# Patient Record
Sex: Male | Born: 1940
Health system: Southern US, Community
[De-identification: ages and names within clinical notes are randomized; demographics above are authoritative.]

## PROBLEM LIST (undated history)

## (undated) DIAGNOSIS — E7800 Pure hypercholesterolemia, unspecified: Secondary | ICD-10-CM

## (undated) DIAGNOSIS — F0390 Unspecified dementia without behavioral disturbance: Secondary | ICD-10-CM

## (undated) DIAGNOSIS — I209 Angina pectoris, unspecified: Secondary | ICD-10-CM

## (undated) DIAGNOSIS — K219 Gastro-esophageal reflux disease without esophagitis: Secondary | ICD-10-CM

## (undated) DIAGNOSIS — I2510 Atherosclerotic heart disease of native coronary artery without angina pectoris: Secondary | ICD-10-CM

## (undated) HISTORY — PX: ABDOMINAL SURGERY: SHX537

## (undated) HISTORY — PX: CORONARY ANGIOPLASTY: SHX604

## (undated) HISTORY — PX: CARDIAC CATHETERIZATION: SHX172

---

## 1998-02-03 ENCOUNTER — Encounter: Admission: RE | Admit: 1998-02-03 | Discharge: 1998-02-03 | Payer: Self-pay | Admitting: *Deleted

## 2001-05-30 ENCOUNTER — Emergency Department (HOSPITAL_COMMUNITY): Admission: EM | Admit: 2001-05-30 | Discharge: 2001-05-30 | Payer: Self-pay | Admitting: Emergency Medicine

## 2001-05-30 ENCOUNTER — Encounter: Payer: Self-pay | Admitting: Emergency Medicine

## 2013-05-12 ENCOUNTER — Encounter (HOSPITAL_COMMUNITY): Payer: Self-pay | Admitting: Emergency Medicine

## 2013-05-12 ENCOUNTER — Inpatient Hospital Stay (HOSPITAL_COMMUNITY): Payer: Medicare Other

## 2013-05-12 ENCOUNTER — Telehealth: Payer: Self-pay | Admitting: Physician Assistant

## 2013-05-12 ENCOUNTER — Encounter (HOSPITAL_COMMUNITY)
Admission: EM | Disposition: A | Discharge status: Home / Self Care | Payer: Medicare Other | Source: Home / Self Care | Attending: Cardiology

## 2013-05-12 ENCOUNTER — Inpatient Hospital Stay (HOSPITAL_COMMUNITY)
Admission: EM | Admit: 2013-05-12 | Discharge: 2013-05-15 | DRG: 247 | Disposition: A | Discharge status: Home / Self Care | Payer: Medicare Other | Attending: Cardiology | Admitting: Cardiology

## 2013-05-12 DIAGNOSIS — E7800 Pure hypercholesterolemia, unspecified: Secondary | ICD-10-CM | POA: Diagnosis present

## 2013-05-12 DIAGNOSIS — Z87891 Personal history of nicotine dependence: Secondary | ICD-10-CM

## 2013-05-12 DIAGNOSIS — K219 Gastro-esophageal reflux disease without esophagitis: Secondary | ICD-10-CM | POA: Diagnosis present

## 2013-05-12 DIAGNOSIS — Z602 Problems related to living alone: Secondary | ICD-10-CM

## 2013-05-12 DIAGNOSIS — E785 Hyperlipidemia, unspecified: Secondary | ICD-10-CM

## 2013-05-12 DIAGNOSIS — I2582 Chronic total occlusion of coronary artery: Secondary | ICD-10-CM | POA: Diagnosis present

## 2013-05-12 DIAGNOSIS — I213 ST elevation (STEMI) myocardial infarction of unspecified site: Secondary | ICD-10-CM

## 2013-05-12 DIAGNOSIS — Z79899 Other long term (current) drug therapy: Secondary | ICD-10-CM

## 2013-05-12 DIAGNOSIS — I498 Other specified cardiac arrhythmias: Secondary | ICD-10-CM | POA: Diagnosis not present

## 2013-05-12 DIAGNOSIS — I2510 Atherosclerotic heart disease of native coronary artery without angina pectoris: Secondary | ICD-10-CM

## 2013-05-12 DIAGNOSIS — I2129 ST elevation (STEMI) myocardial infarction involving other sites: Principal | ICD-10-CM | POA: Diagnosis present

## 2013-05-12 DIAGNOSIS — Z7902 Long term (current) use of antithrombotics/antiplatelets: Secondary | ICD-10-CM

## 2013-05-12 DIAGNOSIS — Z7982 Long term (current) use of aspirin: Secondary | ICD-10-CM

## 2013-05-12 HISTORY — DX: Atherosclerotic heart disease of native coronary artery without angina pectoris: I25.10

## 2013-05-12 HISTORY — DX: Pure hypercholesterolemia, unspecified: E78.00

## 2013-05-12 HISTORY — DX: Angina pectoris, unspecified: I20.9

## 2013-05-12 HISTORY — PX: LEFT HEART CATHETERIZATION WITH CORONARY ANGIOGRAM: SHX5451

## 2013-05-12 HISTORY — DX: Gastro-esophageal reflux disease without esophagitis: K21.9

## 2013-05-12 HISTORY — DX: ST elevation (STEMI) myocardial infarction involving other sites: I21.29

## 2013-05-12 HISTORY — PX: PERCUTANEOUS STENT INTERVENTION: SHX5500

## 2013-05-12 LAB — COMPREHENSIVE METABOLIC PANEL
ALBUMIN: 3.6 g/dL (ref 3.5–5.2)
ALK PHOS: 72 U/L (ref 39–117)
ALT: 17 U/L (ref 0–53)
AST: 36 U/L (ref 0–37)
BILIRUBIN TOTAL: 0.4 mg/dL (ref 0.3–1.2)
BUN: 14 mg/dL (ref 6–23)
CHLORIDE: 105 meq/L (ref 96–112)
CO2: 26 mEq/L (ref 19–32)
Calcium: 9.4 mg/dL (ref 8.4–10.5)
Creatinine, Ser: 0.81 mg/dL (ref 0.50–1.35)
GFR calc Af Amer: >90 mL/min (ref >90)
GFR calc non Af Amer: 87 mL/min — ABNORMAL LOW (ref >90)
Glucose, Bld: 111 mg/dL — ABNORMAL HIGH (ref 70–99)
POTASSIUM: 4.3 meq/L (ref 3.7–5.3)
SODIUM: 142 meq/L (ref 137–147)
Total Protein: 6.6 g/dL (ref 6.0–8.3)

## 2013-05-12 LAB — CBC WITH DIFFERENTIAL/PLATELET
BASOS ABS: 0 10*3/uL (ref 0.0–0.1)
Basophils Relative: 0 % (ref 0–1)
Eosinophils Absolute: 0 10*3/uL (ref 0.0–0.7)
Eosinophils Relative: 0 % (ref 0–5)
HEMATOCRIT: 43 % (ref 39.0–52.0)
Hemoglobin: 14.5 g/dL (ref 13.0–17.0)
LYMPHS PCT: 12 % (ref 12–46)
Lymphs Abs: 1 10*3/uL (ref 0.7–4.0)
MCH: 30.4 pg (ref 26.0–34.0)
MCHC: 33.7 g/dL (ref 30.0–36.0)
MCV: 90.1 fL (ref 78.0–100.0)
MONO ABS: 0.7 10*3/uL (ref 0.1–1.0)
Monocytes Relative: 9 % (ref 3–12)
NEUTROS ABS: 6.6 10*3/uL (ref 1.7–7.7)
NEUTROS PCT: 79 % — AB (ref 43–77)
Platelets: 219 10*3/uL (ref 150–400)
RBC: 4.77 MIL/uL (ref 4.22–5.81)
RDW: 13.7 % (ref 11.5–15.5)
WBC: 8.4 10*3/uL (ref 4.0–10.5)

## 2013-05-12 LAB — PRO B NATRIURETIC PEPTIDE: Pro B Natriuretic peptide (BNP): 699.9 pg/mL — ABNORMAL HIGH (ref 0–125)

## 2013-05-12 LAB — MRSA PCR SCREENING: MRSA by PCR: NEGATIVE

## 2013-05-12 LAB — MAGNESIUM: Magnesium: 2.2 mg/dL (ref 1.5–2.5)

## 2013-05-12 LAB — TROPONIN I: Troponin I: 5.95 ng/mL (ref <0.30)

## 2013-05-12 SURGERY — LEFT HEART CATHETERIZATION WITH CORONARY ANGIOGRAM
Anesthesia: LOCAL

## 2013-05-12 MED ORDER — HEPARIN SODIUM (PORCINE) 5000 UNIT/ML IJ SOLN: 60.0000 [IU]/kg | INTRAMUSCULAR | Status: DC

## 2013-05-12 MED ORDER — ASPIRIN EC 81 MG PO TBEC
81.0000 mg | DELAYED_RELEASE_TABLET | Freq: Every day | ORAL | Status: DC
  Administered 2013-05-13 – 2013-05-15 (×3): 81 mg via ORAL
  Filled 2013-05-12 (×3): qty 1

## 2013-05-12 MED ORDER — HEPARIN SODIUM (PORCINE) 1000 UNIT/ML IJ SOLN
INTRAMUSCULAR | Status: AC
  Filled 2013-05-12: qty 1

## 2013-05-12 MED ORDER — MIDAZOLAM HCL 2 MG/2ML IJ SOLN
INTRAMUSCULAR | Status: AC
  Filled 2013-05-12: qty 2

## 2013-05-12 MED ORDER — OXYCODONE-ACETAMINOPHEN 5-325 MG PO TABS: 1.0000 | ORAL_TABLET | ORAL | Status: DC | PRN

## 2013-05-12 MED ORDER — SODIUM CHLORIDE 0.9 % IV SOLN
INTRAVENOUS | Status: DC
Start: 2013-05-12 — End: 2013-05-13
  Administered 2013-05-12 – 2013-05-13 (×2): via INTRAVENOUS

## 2013-05-12 MED ORDER — TICAGRELOR 90 MG PO TABS
ORAL_TABLET | ORAL | Status: AC
  Filled 2013-05-12: qty 2

## 2013-05-12 MED ORDER — BIVALIRUDIN 250 MG IV SOLR
INTRAVENOUS | Status: AC
  Filled 2013-05-12: qty 250

## 2013-05-12 MED ORDER — HEPARIN (PORCINE) IN NACL 2-0.9 UNIT/ML-% IJ SOLN
INTRAMUSCULAR | Status: AC
  Filled 2013-05-12: qty 1000

## 2013-05-12 MED ORDER — HEPARIN BOLUS VIA INFUSION
4000.0000 [IU] | Freq: Once | INTRAVENOUS | Status: DC
  Filled 2013-05-12: qty 4000

## 2013-05-12 MED ORDER — ATORVASTATIN CALCIUM 80 MG PO TABS
80.0000 mg | ORAL_TABLET | Freq: Every day | ORAL | Status: DC
  Administered 2013-05-12 – 2013-05-14 (×3): 80 mg via ORAL
  Filled 2013-05-12 (×4): qty 1

## 2013-05-12 MED ORDER — FENTANYL CITRATE 0.05 MG/ML IJ SOLN
INTRAMUSCULAR | Status: AC
  Filled 2013-05-12: qty 2

## 2013-05-12 MED ORDER — SODIUM CHLORIDE 0.9 % IV SOLN: INTRAVENOUS | Status: DC

## 2013-05-12 MED ORDER — FAMOTIDINE 10 MG PO TABS
10.0000 mg | ORAL_TABLET | Freq: Every day | ORAL | Status: DC
  Administered 2013-05-12 – 2013-05-15 (×4): 10 mg via ORAL
  Filled 2013-05-12 (×5): qty 1

## 2013-05-12 MED ORDER — NITROGLYCERIN 0.4 MG SL SUBL: 0.4000 mg | SUBLINGUAL_TABLET | SUBLINGUAL | Status: DC | PRN

## 2013-05-12 MED ORDER — TICAGRELOR 90 MG PO TABS
90.0000 mg | ORAL_TABLET | Freq: Two times a day (BID) | ORAL | Status: DC
  Administered 2013-05-12 – 2013-05-15 (×6): 90 mg via ORAL
  Filled 2013-05-12 (×7): qty 1

## 2013-05-12 MED ORDER — LIDOCAINE HCL (PF) 1 % IJ SOLN
INTRAMUSCULAR | Status: AC
  Filled 2013-05-12: qty 30

## 2013-05-12 MED ORDER — MORPHINE SULFATE 2 MG/ML IJ SOLN: 2.0000 mg | INTRAMUSCULAR | Status: DC | PRN

## 2013-05-12 MED ORDER — HEPARIN SODIUM (PORCINE) 5000 UNIT/ML IJ SOLN
5000.0000 [IU] | Freq: Three times a day (TID) | INTRAMUSCULAR | Status: DC
  Administered 2013-05-13 – 2013-05-15 (×7): 5000 [IU] via SUBCUTANEOUS
  Filled 2013-05-12 (×10): qty 1

## 2013-05-12 MED ORDER — VERAPAMIL HCL 2.5 MG/ML IV SOLN
INTRAVENOUS | Status: AC
  Filled 2013-05-12: qty 2

## 2013-05-12 MED ORDER — TIROFIBAN HCL IV 5 MG/100ML
INTRAVENOUS | Status: AC
  Filled 2013-05-12: qty 100

## 2013-05-12 NOTE — H&P (Signed)
History and Physical  Patient ID: Carlos [name] MRN: [id number], SOB: 03/21/1940 73 y.o. Date of Encounter: 05/12/2013, 11:19 AM  Primary Physician: Dr Tenny Crawoss Primary Cardiologist: none (scheduled to see Dr Donnie Ahoilley)  Chief Complaint: Chest Pain  HPI: 73 y.o. male with no past cardiac history who presented to [facility] on 05/12/2013 with complaints of chest pain. He has had stuttering chest pain for the past week, but developed more persistent and severe pain this morning. He has seen his PCP and was scheduled for an outpatient cardiology evaluation this week with Dr Donnie Ahoilley. EMS was called this morning and a Code STEMI was called from the field.  The patient reports 2/10 ongoing chest pain at present. He denies dyspnea, edema, lightheadedness, or syncope. He states 'I don't feel well right now.'  He is a widower x 10 years and lives alone. His neighbor is here with him today. The patient is a former smoker but quit several years ago. He does not drink alcohol.  Past Medical History  Diagnosis Date  . GERD (gastroesophageal reflux disease)   . Angina pectoris   . Hypercholesteremia      Surgical History:  Past Surgical History  Procedure Laterality Date  . Abdominal surgery       Home Meds: Prior to Admission medications   Medication Sig Start Date End Date Taking? Authorizing Provider  aspirin 325 MG EC tablet Take 325 mg by mouth daily.   Yes Historical Provider, MD  calcium carbonate (OS-CAL) 600 MG TABS tablet Take 800 mg by mouth daily with breakfast.   Yes Historical Provider, MD  famotidine (PEPCID) 10 MG tablet Take 10 mg by mouth daily.   Yes Historical Provider, MD  glucosamine-chondroitin 500-400 MG tablet Take 1 tablet by mouth daily.   Yes Historical Provider, MD  Multiple Vitamins-Minerals (MULTIVITAMIN WITH MINERALS) tablet Take 1 tablet by mouth daily.   Yes Historical Provider, MD  nabumetone (RELAFEN) 750 MG tablet Take 750 mg by mouth daily.   Yes  Historical Provider, MD  simvastatin (ZOCOR) 40 MG tablet Take 40 mg by mouth daily.   Yes Historical Provider, MD  vitamin A 7500 UNIT capsule Take 7,500 Units by mouth every other day.   Yes Historical Provider, MD    Allergies: No Known Allergies  History   Social History  . Marital Status: Single    Spouse Name: N/A    Number of Children: N/A  . Years of Education: N/A   Occupational History  . Not on file.   Social History Main Topics  . Smoking status: Former Games developermoker  . Smokeless tobacco: Never Used  . Alcohol Use: No  . Drug Use: No  . Sexual Activity: Not on file   Other Topics Concern  . Not on file   Social History Narrative  . No narrative on file    Family Hx: Brother had CABG in his 4560's  Review of Systems: General: negative for chills, fever, night sweats or weight changes.  ENT: negative for rhinorrhea or epistaxis Cardiovascular: see HPI Dermatological: negative for rash Respiratory: negative for cough or wheezing GI: negative for nausea, vomiting, diarrhea, bright red blood per rectum, melena, or hematemesis GU: no hematuria, urgency, or frequency Neurologic: negative for visual changes, syncope, headache, or dizziness Heme: no easy bruising or bleeding Endo: negative for excessive thirst, thyroid disorder, or flushing Musculoskeletal: negative for joint pain or swelling, negative for myalgias All other systems reviewed and are otherwise negative except  as noted above.  Physical Exam: Blood pressure 125/74, pulse 73, temperature 97.8 F (36.6 C), temperature source Oral, resp. rate 21, SpO2 99.00%. General: Well developed, well nourished, alert and oriented, in no acute distress. HEENT: Normocephalic, atraumatic, sclera non-icteric, no xanthomas, nares are without discharge.  Neck: Supple. Carotids 2+ without bruits. JVP normal Lungs: Clear bilaterally to auscultation without wheezes, rales, or rhonchi. Breathing is unlabored. Heart: RRR with  normal S1 and S2. No murmurs, rubs, or gallops appreciated. Abdomen: Soft, non-tender, non-distended with normoactive bowel sounds. No hepatomegaly. No rebound/guarding. No obvious abdominal masses. Back: No CVA tenderness Msk:  Strength and tone appear normal for age. Extremities: No clubbing, cyanosis, or edema.  Distal pedal pulses are 2+ and equal bilaterally. Neuro: CNII-XII intact, moves all extremities spontaneously. Psych:  Responds to questions appropriately with a normal affect.   Labs:   No results found for this basename: WBC, HGB, HCT, MCV, PLT   No results found for this basename: NA, K, CL, CO2, BUN, CREATININE, CALCIUM, LABALBU, PROT, BILITOT, ALKPHOS, ALT, AST, GLUCOSE,  in the last 168 hours No results found for this basename: CKTOTAL, CKMB, TROPONINI,  in the last 72 hours No results found for this basename: CHOL, HDL, LDLCALC, TRIG   No results found for this basename: DDIMER    Radiology/Studies:  No results found.   EKG: Sinus rhythm with subtle inferolateral ST elevation, but suggestive of STEMI  ASSESSMENT AND PLAN:  73 year-old male with stuttering angina x one week, now with ongoing rest pain and EKG suggestive of lateral STEMI.   Heparin 4000 units and Brilinta 180 mg   Emergency cath and primary PCI (emergency informed consent obtained)  Check lipids, baseline labs, CXR  Further plans and disposition pending cath results  Signed, [name]  05/12/2013, 11:19 AM

## 2013-05-12 NOTE — ED Notes (Signed)
Pt placed on zoll and accompanied to cath lab by rn and dr cooper. Pt cp 2/10. Dr Excell Seltzer has advised to hold pt heparin and move to cath lab they are ready for pt. Pt belongings taken to cath lab with pt. He has no family here with him. Sinus rhythm on monitor. Pt alert and oriented

## 2013-05-12 NOTE — ED Notes (Signed)
ecg completed and shown to dr wofford. Pt rates pain 2/10

## 2013-05-12 NOTE — ED Provider Notes (Signed)
CSN: [id number]     Arrival date & time 05/12/13  1040 History   First MD Initiated Contact with Patient 05/12/13 1102     Chief Complaint  Patient presents with  . Chest Pain     (Consider location/radiation/quality/duration/timing/severity/associated sxs/prior Treatment) Patient is a 73 y.o. male presenting with chest pain.  Chest Pain Pain location:  Substernal area Pain quality: sharp   Pain severity:  Moderate Onset quality:  Sudden Duration: 1 week, intermittent. Timing:  Intermittent Progression:  Worsening Chronicity:  New Context comment:  Took aspirin 325 at about 8 am.   Relieved by:  Leaning forward (nitro) Associated symptoms: shortness of breath   Associated symptoms: no abdominal pain, no cough, no diaphoresis, no fever, no nausea and not vomiting     Past Medical History  Diagnosis Date  . GERD (gastroesophageal reflux disease)   . Angina pectoris   . Hypercholesteremia    Past Surgical History  Procedure Laterality Date  . Abdominal surgery     No family history on file. History  Substance Use Topics  . Smoking status: Former Games developermoker  . Smokeless tobacco: Never Used  . Alcohol Use: No    Review of Systems  Constitutional: Negative for fever and diaphoresis.  Respiratory: Positive for shortness of breath. Negative for cough.   Cardiovascular: Positive for chest pain.  Gastrointestinal: Negative for nausea, vomiting, abdominal pain and diarrhea.  All other systems reviewed and are negative.      Allergies  Review of patient's allergies indicates no known allergies.  Home Medications   Current Outpatient Rx  Name  Route  Sig  Dispense  Refill  . aspirin 325 MG EC tablet   Oral   Take 325 mg by mouth daily.         . calcium carbonate (OS-CAL) 600 MG TABS tablet   Oral   Take 800 mg by mouth daily with breakfast.         . famotidine (PEPCID) 10 MG tablet   Oral   Take 10 mg by mouth daily.         Marland Kitchen. glucosamine-chondroitin  500-400 MG tablet   Oral   Take 1 tablet by mouth daily.         . Multiple Vitamins-Minerals (MULTIVITAMIN WITH MINERALS) tablet   Oral   Take 1 tablet by mouth daily.         . nabumetone (RELAFEN) 750 MG tablet   Oral   Take 750 mg by mouth daily.         . simvastatin (ZOCOR) 40 MG tablet   Oral   Take 40 mg by mouth daily.         . vitamin A 7500 UNIT capsule   Oral   Take 7,500 Units by mouth every other day.          BP 125/74  Pulse 71  Temp(Src) 97.8 F (36.6 C) (Oral)  Resp 18  SpO2 99% Physical Exam  Nursing note and vitals reviewed. Constitutional: He is oriented to person, place, and time. He appears well-developed and well-nourished. No distress.  HENT:  Head: Normocephalic and atraumatic.  Mouth/Throat: Oropharynx is clear and moist.  Eyes: Conjunctivae are normal. Pupils are equal, round, and reactive to light. No scleral icterus.  Neck: Neck supple.  Cardiovascular: Normal rate, regular rhythm, normal heart sounds and intact distal pulses.   No murmur heard. Pulmonary/Chest: Effort normal and breath sounds normal. No stridor. No respiratory distress. He has no  wheezes. He has no rales.  Abdominal: Soft. He exhibits no distension. There is no tenderness.  Musculoskeletal: Normal range of motion. He exhibits no edema.  Neurological: He is alert and oriented to person, place, and time.  Skin: Skin is warm and dry. No rash noted.  Psychiatric: He has a normal mood and affect. His behavior is normal.    ED Course  CRITICAL CARE Performed by: Blake Divine DAVID III Authorized by: Blake Divine DAVID III Total critical care time: 30 minutes Critical care time was exclusive of separately billable procedures and treating other patients. Critical care was necessary to treat or prevent imminent or life-threatening deterioration of the following conditions: cardiac failure. Critical care was time spent personally by me on the following activities:  development of treatment plan with patient or surrogate, discussions with consultants, evaluation of patient's response to treatment, examination of patient, obtaining history from patient or surrogate, ordering and performing treatments and interventions, ordering and review of laboratory studies, ordering and review of radiographic studies, pulse oximetry, re-evaluation of patient's condition and review of old charts.   (including critical care time) Labs Review Labs Reviewed - No data to display Imaging Review No results found.   EKG Interpretation   Date/Time:  Saturday May 12 2013 10:48:13 EDT Ventricular Rate:  67 PR Interval:  161 QRS Duration: 107 QT Interval:  374 QTC Calculation: 395 R Axis:   -42 Text Interpretation:  sinus rhythm Inferior infarct, acute (RCA) Probable  RV involvement, suggest recording right precordial leads Baseline wander  in lead(s) I III aVR aVL V2 V4 V5 V6 Reconfirmed by [facility]  MD, TREY  ([id number]) on 05/12/2013 12:28:02 PM      MDM   Final diagnoses:  STEMI (ST elevation myocardial infarction)    Well appearing 73 yo male with chest pain.  Took full aspirin prior to EMS arrival.  EMS 12 lead concerning for STEMI.  Code STEMI called prior to arrival.  Pain near resolved with nitro and EKG changes not as pronounced on our EKGs.  Discussed with Dr. Excell Seltzer who has taken pt to cath lab.      Candyce Churn III, MD 05/12/13 1230

## 2013-05-12 NOTE — Interval H&P Note (Signed)
History and Physical Interval Note:  05/12/2013 12:55 PM  Carlos [name]  has presented today for surgery, with the diagnosis of STEMI  The various methods of treatment have been discussed with the patient and family. After consideration of risks, benefits and other options for treatment, the patient has consented to  Procedure(s): LEFT HEART CATHETERIZATION WITH CORONARY ANGIOGRAM (N/A) PERCUTANEOUS STENT INTERVENTION (N/A) as a surgical intervention .  The patient's history has been reviewed, patient examined, no change in status, stable for surgery.  I have reviewed the patient's chart and labs.  Questions were answered to the patient's satisfaction.    Cath Lab Visit (complete for each Cath Lab visit)  Clinical Evaluation Leading to the Procedure:   ACS: yes  Non-ACS:    Anginal Classification: CCS IV  Anti-ischemic medical therapy: No Therapy  Non-Invasive Test Results: No non-invasive testing performed  Prior CABG: No previous CABG        [name]

## 2013-05-12 NOTE — ED Notes (Signed)
Two ekg's were shot.

## 2013-05-12 NOTE — ED Notes (Addendum)
Reports intermittent chest pain over the past week. Onset symptoms last Saturday. States has been having 3-4 episodes of cp daily. This morning pain worse and radiated to the bilat neck.pt did take 325 of asa this morning around 8 am at home after breakfast. He also took 1 sl ntg. ems gave 2 ntg enroute

## 2013-05-12 NOTE — Progress Notes (Signed)
Chaplain paged to code stemi. Patient being worked on IT consultant. No family present. Chaplain to be contacted when family arrives.   05/12/13 1035  Clinical Encounter Type  Visited With Patient not available;Health care provider  Visit Type Initial;Code;ED

## 2013-05-12 NOTE — CV Procedure (Signed)
    Cardiac Catheterization Procedure Note  Name: Carlos [name] MRN: [id number] DOB: 1940-08-15  Procedure: Left Heart Cath, Selective Coronary Angiography, LV angiography, PTCA and stenting of the left circumflex (primary PCI)  Indication: Acute lateral STEMI  Procedural Details:  The right wrist was prepped, draped, and anesthetized with 1% lidocaine. Using the modified Seldinger technique, a 5/6 French sheath was introduced into the right radial artery. 3 mg of verapamil was administered through the sheath, weight-based unfractionated heparin was administered intravenously. Standard Judkins catheters were used for selective coronary angiography and left ventriculography. Ventriculography was performed after PCI.  Catheter exchanges were performed over an exchange length guidewire.  PROCEDURAL FINDINGS Hemodynamics: AO 90/53 LV 88/18   Coronary angiography: Coronary dominance: right  Left mainstem: Widely patent without obstructive disease  Left anterior descending (LAD): There is heavy calcification throughout the proximal and mid-LAD. The first diagonal is medium in caliber and has 70% ostial stenosis. The mid-LAD has diffuse 75-80% stenosis at it's worst. The distal LAD is widely patent.   Left circumflex (LCx): Acute total occlusion in the mid-vessel. After reperfusion, the LCx is a large distribution vessel supply a large second OM and multiple PLA branches.   Right coronary artery (RCA): Total proximal occlusion. The PDA fills from left-to-right collaterals.   Left ventriculography: Left ventricular systolic function is normal, LVEF is estimated at 55%, there is no significant mitral regurgitation   PCI Note:  Following the diagnostic procedure, the decision was made to proceed with PCI.  Brilinta 180 mg was given.  Weight-based bivalirudin was given for anticoagulation. Once a therapeutic ACT was achieved, a 6 [place] XB-LAD 3.5 cm guide catheter was inserted.  A cougar  coronary guidewire was used to cross the lesion.  The lesion was predilated with a 2.5x12 mm balloon.  The lesion was then stented with a 3.25x32 mm Xience drug-eluting stent.  There was slow flow after stenting. IC verapamil was administered. The stent appeared well-sized to the vessel and I elected not to post-dilate because of concerns about worsening distal embolization and no-reflow. After IC verapamil, flow improved to TIMI-3.   Following PCI, there was 0% residual stenosis and TIMI-3 flow. Final angiography confirmed an excellent result. The patient tolerated the procedure well. There were no immediate procedural complications. A TR band was used for radial hemostasis. The patient was transferred to the post catheterization recovery area for further monitoring.  PCI Data: Vessel - LCx/Segment - Mid Percent Stenosis (pre)  100 TIMI-flow 0 Stent 3.25x32 mm Xience DES Percent Stenosis (post) 0 TIMI-flow (post) 3  Final Conclusions:   1. Acute lateral STEMI secondary to occlusion of a large-distribution left circumflex, treated successfully with Primary PCI using a drug-eluting stent 2. Multivessel CAD with moderately severe diffuse mid-LAD stenosis and chronic total occlusion of the RCA 3. Preserved LV systolic function   Recommendations:  Tx to the CCU for post-MI care. ASA/brilinta x 12 months. Will review films, but favor early medical therapy for residual CAD. No beta-blocker initially as pt is bradycardiac.  [name] 05/12/2013, 12:57 PM

## 2013-05-12 NOTE — Telephone Encounter (Signed)
Pt is not a patient of ours yet but called answering service saying he recently was referred to our office for an appt next Tuesday for intermittent CP. He is c/o significant chest pain today and wants to know if he should take any additional medicine. I advised he call 911 immediately for further evaluation. He verbalized understanding and gratitude and plans to do so. Dayna Dunn PA-C

## 2013-05-13 DIAGNOSIS — I219 Acute myocardial infarction, unspecified: Secondary | ICD-10-CM

## 2013-05-13 LAB — LIPID PANEL
CHOL/HDL RATIO: 3.7 ratio
Cholesterol: 160 mg/dL (ref 0–200)
HDL: 43 mg/dL (ref >39)
LDL Cholesterol: 93 mg/dL (ref 0–99)
Triglycerides: 120 mg/dL (ref <150)
VLDL: 24 mg/dL (ref 0–40)

## 2013-05-13 LAB — TROPONIN I

## 2013-05-13 LAB — CBC
HEMATOCRIT: 41.9 % (ref 39.0–52.0)
Hemoglobin: 14.1 g/dL (ref 13.0–17.0)
MCH: 30.7 pg (ref 26.0–34.0)
MCHC: 33.7 g/dL (ref 30.0–36.0)
MCV: 91.3 fL (ref 78.0–100.0)
Platelets: 189 10*3/uL (ref 150–400)
RBC: 4.59 MIL/uL (ref 4.22–5.81)
RDW: 14 % (ref 11.5–15.5)
WBC: 8.5 10*3/uL (ref 4.0–10.5)

## 2013-05-13 LAB — BASIC METABOLIC PANEL
BUN: 14 mg/dL (ref 6–23)
CHLORIDE: 108 meq/L (ref 96–112)
CO2: 25 mEq/L (ref 19–32)
Calcium: 9.4 mg/dL (ref 8.4–10.5)
Creatinine, Ser: 0.89 mg/dL (ref 0.50–1.35)
GFR calc non Af Amer: 83 mL/min — ABNORMAL LOW (ref >90)
Glucose, Bld: 117 mg/dL — ABNORMAL HIGH (ref 70–99)
POTASSIUM: 4.1 meq/L (ref 3.7–5.3)
Sodium: 145 mEq/L (ref 137–147)

## 2013-05-13 LAB — TSH: TSH: 2.284 u[IU]/mL (ref 0.350–4.500)

## 2013-05-13 LAB — HEMOGLOBIN A1C
Hgb A1c MFr Bld: 5.7 % — ABNORMAL HIGH (ref <5.7)
MEAN PLASMA GLUCOSE: 117 mg/dL — AB (ref <117)

## 2013-05-13 MED ORDER — METOPROLOL TARTRATE 12.5 MG HALF TABLET
12.5000 mg | ORAL_TABLET | Freq: Two times a day (BID) | ORAL | Status: DC
  Administered 2013-05-13 – 2013-05-15 (×4): 12.5 mg via ORAL
  Filled 2013-05-13 (×6): qty 1

## 2013-05-13 MED ORDER — ALUM & MAG HYDROXIDE-SIMETH 200-200-20 MG/5ML PO SUSP
30.0000 mL | ORAL | Status: DC | PRN
  Administered 2013-05-13 – 2013-05-15 (×2): 30 mL via ORAL
  Filled 2013-05-13 (×2): qty 30

## 2013-05-13 NOTE — Progress Notes (Signed)
Chaplain paged to provide pastoral care to family. Patient's mother at bedside while staff working on patient. Chaplain offered emotional and spiritual support.   05/12/13 2220  Clinical Encounter Type  Visited With Family;Patient not available;Health care provider  Visit Type Initial;ED  Referral From Nurse

## 2013-05-13 NOTE — Progress Notes (Signed)
C/o heartburn while in the room pt had svt run asymptomatic-maalox given w/ relief.

## 2013-05-13 NOTE — Progress Notes (Addendum)
Subjective:   73 y/o with HL admitted yesterday with acute lateral MI. Cath with 3v CAD as below. Underwent PCI/stenting LCX. Dr. Excell Seltzer recommended medical management of residual CAD as long as not symptomatic.   SBP low initially now 103-126.  Denies CP/SOB.    Left mainstem: Widely patent without obstructive disease  Left anterior descending (LAD): There is heavy calcification throughout the proximal and mid-LAD. The first diagonal is medium in caliber and has 70% ostial stenosis. The mid-LAD has diffuse 75-80% stenosis at it's worst. The distal LAD is widely patent.  Left circumflex (LCx): Acute total occlusion in the mid-vessel. After reperfusion, the LCx is a large distribution vessel supply a large second OM and multiple PLA branches.  Right coronary artery (RCA): Total proximal occlusion. The PDA fills from left-to-right collaterals.  Left ventriculography: Left ventricular systolic function is normal, LVEF is estimated at 55%, there is no significant mitral regurgitation      Intake/Output Summary (Last 24 hours) at 05/13/13 1037 Last data filed at 05/13/13 1000  Gross per 24 hour  Intake   2655 ml  Output   2330 ml  Net    325 ml    Current meds: . aspirin EC  81 mg Oral Daily  . atorvastatin  80 mg Oral q1800  . famotidine  10 mg Oral Daily  . heparin  5,000 Units Subcutaneous 3 times per day  . Ticagrelor  90 mg Oral BID   Infusions: . sodium chloride 75 mL/hr at 05/13/13 0143     Objective:  Blood pressure 103/60, pulse 57, temperature 98.4 F (36.9 C), temperature source Oral, resp. rate 17, height 5\' 10"  (1.778 m), weight 84.777 kg (186 lb 14.4 oz), SpO2 95.00%. Weight change:   Physical Exam: General:  Well appearing. No resp difficulty HEENT: normal Neck: supple. JVP 5 . Carotids 2+ bilat; no bruits. No lymphadenopathy or thryomegaly appreciated. Cor: PMI nondisplaced. Regular rate & rhythm. No rubs, gallops or murmurs. Lungs: clear Abdomen: soft,  nontender, nondistended. No hepatosplenomegaly. No bruits or masses. Good bowel sounds. Extremities: no cyanosis, clubbing, rash, edema Neuro: alert & orientedx3, cranial nerves grossly intact. moves all 4 extremities w/o difficulty. Affect pleasant  Telemetry: SR 50-60s  Lab Results: Basic Metabolic Panel:  Recent Labs Lab 05/12/13 1632 05/13/13 0211  NA 142 145  K 4.3 4.1  CL 105 108  CO2 26 25  GLUCOSE 111* 117*  BUN 14 14  CREATININE 0.81 0.89  CALCIUM 9.4 9.4  MG 2.2  --    Liver Function Tests:  Recent Labs Lab 05/12/13 1632  AST 36  ALT 17  ALKPHOS 72  BILITOT 0.4  PROT 6.6  ALBUMIN 3.6   No results found for this basename: LIPASE, AMYLASE,  in the last 168 hours No results found for this basename: AMMONIA,  in the last 168 hours CBC:  Recent Labs Lab 05/12/13 1632 05/13/13 0211  WBC 8.4 8.5  NEUTROABS 6.6  --   HGB 14.5 14.1  HCT 43.0 41.9  MCV 90.1 91.3  PLT 219 189   Cardiac Enzymes:  Recent Labs Lab 05/12/13 1632 05/13/13 0211  TROPONINI 5.95* >20.00*   BNP: No components found with this basename: POCBNP,  CBG: No results found for this basename: GLUCAP,  in the last 168 hours Microbiology: No results found for this basename: cult   No results found for this basename: CULT, SDES,  in the last 168 hours  Imaging: Portable Chest X-ray 1 View  05/12/2013  CLINICAL DATA:  acute MI  EXAM: PORTABLE CHEST - 1 VIEW  COMPARISON:  DG SHOULDER 2+V*L* dated 12/31/2008  FINDINGS: Normal mediastinum and cardiac silhouette. Normal pulmonary vasculature. No evidence of effusion, infiltrate, or pneumothorax. No acute bony abnormality.  IMPRESSION: No acute cardiopulmonary process.   Electronically Signed   By: Genevive BiStewart  Edmunds M.D.   On: 05/12/2013 18:32     ASSESSMENT:  1. Acute lateral STEMI     --s/p PCI LCX 3/21 2. Residual CAD    --totally occluded RCA.     --LAD 75-80% 3. HL  PLAN/DISCUSSION:  Doing well post-MI. Will transfer to  tele. Possibly home in am.  Given residual CAD may warrant submax treadmill in several weeks to assess for low-level ischemia. Consult cardiac rehab. Consider statin, ASA, Brillinta. Start low dose lopressor.  Dr. Donnie Ahoilley to assume care in am.    LOS: 1 day  Arvilla Meresaniel Joshau Code, MD 05/13/2013, 10:37 AM

## 2013-05-14 ENCOUNTER — Encounter (HOSPITAL_COMMUNITY): Payer: Self-pay | Admitting: Cardiology

## 2013-05-14 DIAGNOSIS — I2510 Atherosclerotic heart disease of native coronary artery without angina pectoris: Secondary | ICD-10-CM

## 2013-05-14 DIAGNOSIS — E785 Hyperlipidemia, unspecified: Secondary | ICD-10-CM

## 2013-05-14 HISTORY — DX: Hyperlipidemia, unspecified: E78.5

## 2013-05-14 HISTORY — DX: Atherosclerotic heart disease of native coronary artery without angina pectoris: I25.10

## 2013-05-14 LAB — POCT I-STAT, CHEM 8
BUN: 16 mg/dL (ref 6–23)
CALCIUM ION: 1.29 mmol/L (ref 1.13–1.30)
Chloride: 106 mEq/L (ref 96–112)
Creatinine, Ser: 0.9 mg/dL (ref 0.50–1.35)
Glucose, Bld: 128 mg/dL — ABNORMAL HIGH (ref 70–99)
HCT: 43 % (ref 39.0–52.0)
HEMOGLOBIN: 14.6 g/dL (ref 13.0–17.0)
Potassium: 3.8 mEq/L (ref 3.7–5.3)
Sodium: 141 mEq/L (ref 137–147)
TCO2: 23 mmol/L (ref 0–100)

## 2013-05-14 LAB — BASIC METABOLIC PANEL
BUN: 17 mg/dL (ref 6–23)
CALCIUM: 9.5 mg/dL (ref 8.4–10.5)
CO2: 26 mEq/L (ref 19–32)
Chloride: 107 mEq/L (ref 96–112)
Creatinine, Ser: 0.91 mg/dL (ref 0.50–1.35)
GFR calc Af Amer: >90 mL/min (ref >90)
GFR, EST NON AFRICAN AMERICAN: 83 mL/min — AB (ref >90)
GLUCOSE: 95 mg/dL (ref 70–99)
Potassium: 4.2 mEq/L (ref 3.7–5.3)
Sodium: 142 mEq/L (ref 137–147)

## 2013-05-14 LAB — POCT ACTIVATED CLOTTING TIME
Activated Clotting Time: 149 seconds
Activated Clotting Time: 370 seconds

## 2013-05-14 MED FILL — Sodium Chloride IV Soln 0.9%: INTRAVENOUS | Qty: 50 | Status: AC

## 2013-05-14 NOTE — Care Management Note (Addendum)
    Page 1 of 1   05/14/2013     5:07:22 PM   CARE MANAGEMENT NOTE 05/14/2013  Patient:  Carlos [name], Carlos [name]   Account Number:  [id number]  Date Initiated:  05/14/2013  Documentation initiated by:  Teddi Badalamenti  Subjective/Objective Assessment:   PT ADM ON 3/21 WITH NSTEMI S/P STENT.  PTA, PT LIVES WITH COUSIN.     Action/Plan:   PT ON [place]-30 DAY FREE TRIAL CARD AND BOOKLET GIVEN. PER INSURANCE CARRIER, COPAY WILL BE $40.  TRIAL CARD HAS BEEN ACTIVATED.   Anticipated DC Date:  05/15/2013   Anticipated DC Plan:  HOME/SELF CARE      DC Planning Services  CM consult  Medication Assistance      Choice offered to / List presented to:             Status of service:  Completed, signed off Medicare Important Message given?   (If response is "NO", the following Medicare IM given date fields will be blank) Date Medicare IM given:   Date Additional Medicare IM given:    Discharge Disposition:  HOME/SELF CARE  Per UR Regulation:  Reviewed for med. necessity/level of care/duration of stay  If discussed at Long Length of Stay Meetings, dates discussed:    Comments:

## 2013-05-14 NOTE — Progress Notes (Signed)
CARDIAC REHAB PHASE I   PRE:  Rate/Rhythm: 63 SR    BP: sitting 118/68    SaO2: 95 RA  MODE:  Ambulation: 500 ft   POST:  Rate/Rhythm: 80 SR    BP: sitting 118/66     SaO2:    Tolerated well. Steady. Denied sx. Sts he could have walked his normal mile. Began ed. Pt talkative. Will continue to educate tomorrow. Can walk independently today. [id number]  Elissa Lovett [place] CES, ACSM 05/14/2013 9:45 AM

## 2013-05-14 NOTE — Progress Notes (Signed)
Patient called RN to room because he felt his BP was low. BP was 130/68. HR was SB 58.  Around 2140 pt had quick drop in HR to 38. Between then and 0140 pt has had a few quick drops into the mid 40's. Patient received lopressor 12.5 mg po at 2130 and HR was 63. Will continue to monitor.

## 2013-05-14 NOTE — Progress Notes (Signed)
Subjective:  Felt dizzy last night. Had mild bradycardia last night.  No chest pain or SOB.  Objective:  Vital Signs in the last 24 hours: BP 110/69  Pulse 57  Temp(Src) 98.2 F (36.8 C) (Oral)  Resp 16  Ht 5\' 10"  (1.778 m)  Wt 85.639 kg (188 lb 12.8 oz)  BMI 27.09 kg/m2  SpO2 97%  Physical Exam: Talkative, pleasant WM in NAD Lungs:  Clear  Cardiac:  Regular rhythm, normal S1 and S2, no S3 Abdomen:  Soft, nontender, no masses Extremities:  Radial cath site clean and dry  Intake/Output from previous day: 03/22 0701 - 03/23 0700 In: 705 [P.O.:480; I.V.:225] Out: 380 [Urine:380]  Weight Filed Weights   05/12/13 1300 05/13/13 0500 05/14/13 0702  Weight: 86.3 kg (190 lb 4.1 oz) 84.777 kg (186 lb 14.4 oz) 85.639 kg (188 lb 12.8 oz)    Lab Results: Basic Metabolic Panel:  Recent Labs  78/24/23 0211 05/14/13 0533  NA 145 142  K 4.1 4.2  CL 108 107  CO2 25 26  GLUCOSE 117* 95  BUN 14 17  CREATININE 0.89 0.91    CBC:  Recent Labs  05/12/13 1632 05/13/13 0211  WBC 8.4 8.5  NEUTROABS 6.6  --   HGB 14.5 14.1  HCT 43.0 41.9  MCV 90.1 91.3  PLT 219 189    BNP    Component Value Date/Time   PROBNP 699.9* 05/12/2013 1632   Telemetry: Sinus rhythm  Assessment/Plan:  1. Recent lateral MI 2  CAD 3. Hyperlipidemia  Rec:  Rehab to see.  Home in am.  Will do low level TMT and if he is low level ischemic consider CABG.     Darden Palmer  MD [facility]  05/14/2013, 9:17 AM

## 2013-05-15 MED ORDER — METOPROLOL SUCCINATE ER 25 MG PO TB24: 25.0000 mg | ORAL_TABLET | Freq: Every day | ORAL | Status: DC

## 2013-05-15 MED ORDER — ASPIRIN 81 MG PO TBEC: 81.0000 mg | DELAYED_RELEASE_TABLET | Freq: Every day | ORAL | Status: DC

## 2013-05-15 MED ORDER — TICAGRELOR 90 MG PO TABS: 90.0000 mg | ORAL_TABLET | Freq: Two times a day (BID) | ORAL | Status: DC

## 2013-05-15 MED ORDER — NITROGLYCERIN 0.4 MG SL SUBL: 0.4000 mg | SUBLINGUAL_TABLET | SUBLINGUAL | Status: DC | PRN

## 2013-05-15 NOTE — Progress Notes (Signed)
Ed completed. Voiced understanding. Interested in CRP[facility] after stress testing and requests his name be sent to [facility]. [id number] Ethelda Chick CES, ACSM 9:59 AM 05/15/2013

## 2013-05-15 NOTE — Discharge Instructions (Signed)
Follow instructions given by the cardiac rehabilitation staff. Stop taking the medicine for arthritis. Only take low-dose aspirin. Call if you get recurrent chest pain similar to what you were having prior to coming in the hospital.

## 2013-05-15 NOTE — Discharge Summary (Signed)
Physician Discharge Summary  Patient ID: Carlos [name] MRN: [id number] DOB/AGE: 04/18/1940 73 y.o.  Admit date: 05/12/2013 Discharge date: 05/15/2013  Primary Physician:  Dr. Vianne Bulls  Primary Discharge Diagnosis:  1. Acute lateral wall myocardial infarction  Secondary Discharge Diagnosis: 2. Hyperlipidemia 3. Esophageal reflux 4. Coronary artery disease  Procedures:  Cardiac catheterization with stenting of the circumflex coronary artery  Hospital Course: This 73 year old male has a history of hyperlipidemia and had developed some symptoms suggestive of angina. He was due to have an outpatient cardiology evaluation the day of admission developed prolonged chest discomfort and presented to the emergency room with high lateral myocardial infarction. He was taken to the cardiac catheterization laboratory by Dr. Excell Seltzer and underwent catheterization. He had a heavily calcified LAD with diffuse 75-80% stenosis in the midportion. Circumflex had an acute occlusion in the mid vessel the right coronary artery was totally occluded with left to right collaterals. Ejection fraction was estimated at 55%. The patient underwent stenting with a 3.25 x 32 mm Xience drug-eluting stent and was not post dilated because of concerns divorced and distal embolization and no reflow. He received intercoronary verapamil with improvement in his flow to TIMI grade 3 with 0% residual stenosis. His troponin was greater than 20 and he was monitored in the intensive care unit and later transferred had some mild nocturnal bradycardia. He had no recurrence of chest pain and was started on low-dose beta blockers. He was changed to low-dose aspirin as well as Brilanta and was seen by cardiac rehabilitation. He had no recurrence of chest pain will be discharged home. He will be having an outpatient exercise treadmill test with the thought that if he is significantly ischemic on at that he may require bypass  grafting.  Discharge Exam: Blood pressure 107/70, pulse 61, temperature 98.2 F (36.8 C), temperature source Oral, resp. rate 18, height 5\' 10"  (1.778 m), weight 86.3 kg (190 lb 4.1 oz), SpO2 98.00%.   Lungs clear, no S3  Labs: CBC:   Lab Results  Component Value Date   WBC 8.5 05/13/2013   HGB 14.1 05/13/2013   HCT 41.9 05/13/2013   MCV 91.3 05/13/2013   PLT 189 05/13/2013   CMP:  Recent Labs Lab 05/12/13 1632  05/14/13 0533  NA 142  < > 142  K 4.3  < > 4.2  CL 105  < > 107  CO2 26  < > 26  BUN 14  < > 17  CREATININE 0.81  < > 0.91  CALCIUM 9.4  < > 9.5  PROT 6.6  --   --   BILITOT 0.4  --   --   ALKPHOS 72  --   --   ALT 17  --   --   AST 36  --   --   GLUCOSE 111*  < > 95  < > = values in this interval not displayed.  Lipid Panel     Component Value Date/Time   CHOL 160 05/13/2013 0211   TRIG 120 05/13/2013 0211   HDL 43 05/13/2013 0211   CHOLHDL 3.7 05/13/2013 0211   VLDL 24 05/13/2013 0211   LDLCALC 93 05/13/2013 0211   Cardiac Enzymes:  Recent Labs  05/12/13 1632 05/13/13 0211  TROPONINI 5.95* >20.00*   BNP (last 3 results)  Recent Labs  05/12/13 1632  PROBNP 699.9*   Thyroid: Lab Results  Component Value Date   TSH 2.284 05/12/2013   Hemoglobin A1C: Lab Results  Component  Value Date   HGBA1C 5.7* 05/12/2013     Radiology: Clear lung fields  EKG: High lateral ST segment elevation that resolved following intervention  Discharge Medications:   Medication List         aspirin 81 MG EC tablet  Take 1 tablet (81 mg total) by mouth daily.     Calcium Carbonate Antacid 400 MG Chew  Chew 800 mg by mouth as needed (heart burn).     famotidine 10 MG tablet  Commonly known as:  PEPCID  Take 10 mg by mouth daily as needed for heartburn.     glucosamine-chondroitin 500-400 MG tablet  Take 1 tablet by mouth daily.     metoprolol succinate 25 MG 24 hr tablet  Commonly known as:  TOPROL XL  Take 1 tablet (25 mg total) by mouth daily.      multivitamin with minerals tablet  Take 0.5 tablets by mouth daily.     nabumetone 750 MG tablet  Commonly known as:  RELAFEN  Take 750 mg by mouth every other day.     nitroGLYCERIN 0.4 MG SL tablet  Commonly known as:  NITROSTAT  Place 1 tablet (0.4 mg total) under the tongue every 5 (five) minutes x 3 doses as needed for chest pain.     omega-3 acid ethyl esters 1 G capsule  Commonly known as:  LOVAZA  Take 1 g by mouth every other day.     simvastatin 40 MG tablet  Commonly known as:  ZOCOR  Take 40 mg by mouth daily at 6 PM.     Ticagrelor 90 MG Tabs tablet  Commonly known as:  BRILINTA  Take 1 tablet (90 mg total) by mouth 2 (two) times daily.       Followup plans and appointments: Followup with Dr. Donnie Ahoilley in one to 2 weeks  Time spent with patient to include physician time:  45 minutes.  Signed: Darden PalmerW. Spencer Tilley, Jr. MD [facility] 05/15/2013, 8:54 AM

## 2014-01-31 ENCOUNTER — Encounter (HOSPITAL_COMMUNITY): Payer: Self-pay | Admitting: Cardiovascular Disease

## 2015-05-12 IMAGING — CR DG CHEST 1V PORT
1 series · 1 of 1 positions shown · non-contrast
Comparison: DG SHOULDER 2+V*L* dated 12/31/2008

CLINICAL DATA: acute MI

EXAM:
PORTABLE CHEST - 1 VIEW

[AP]
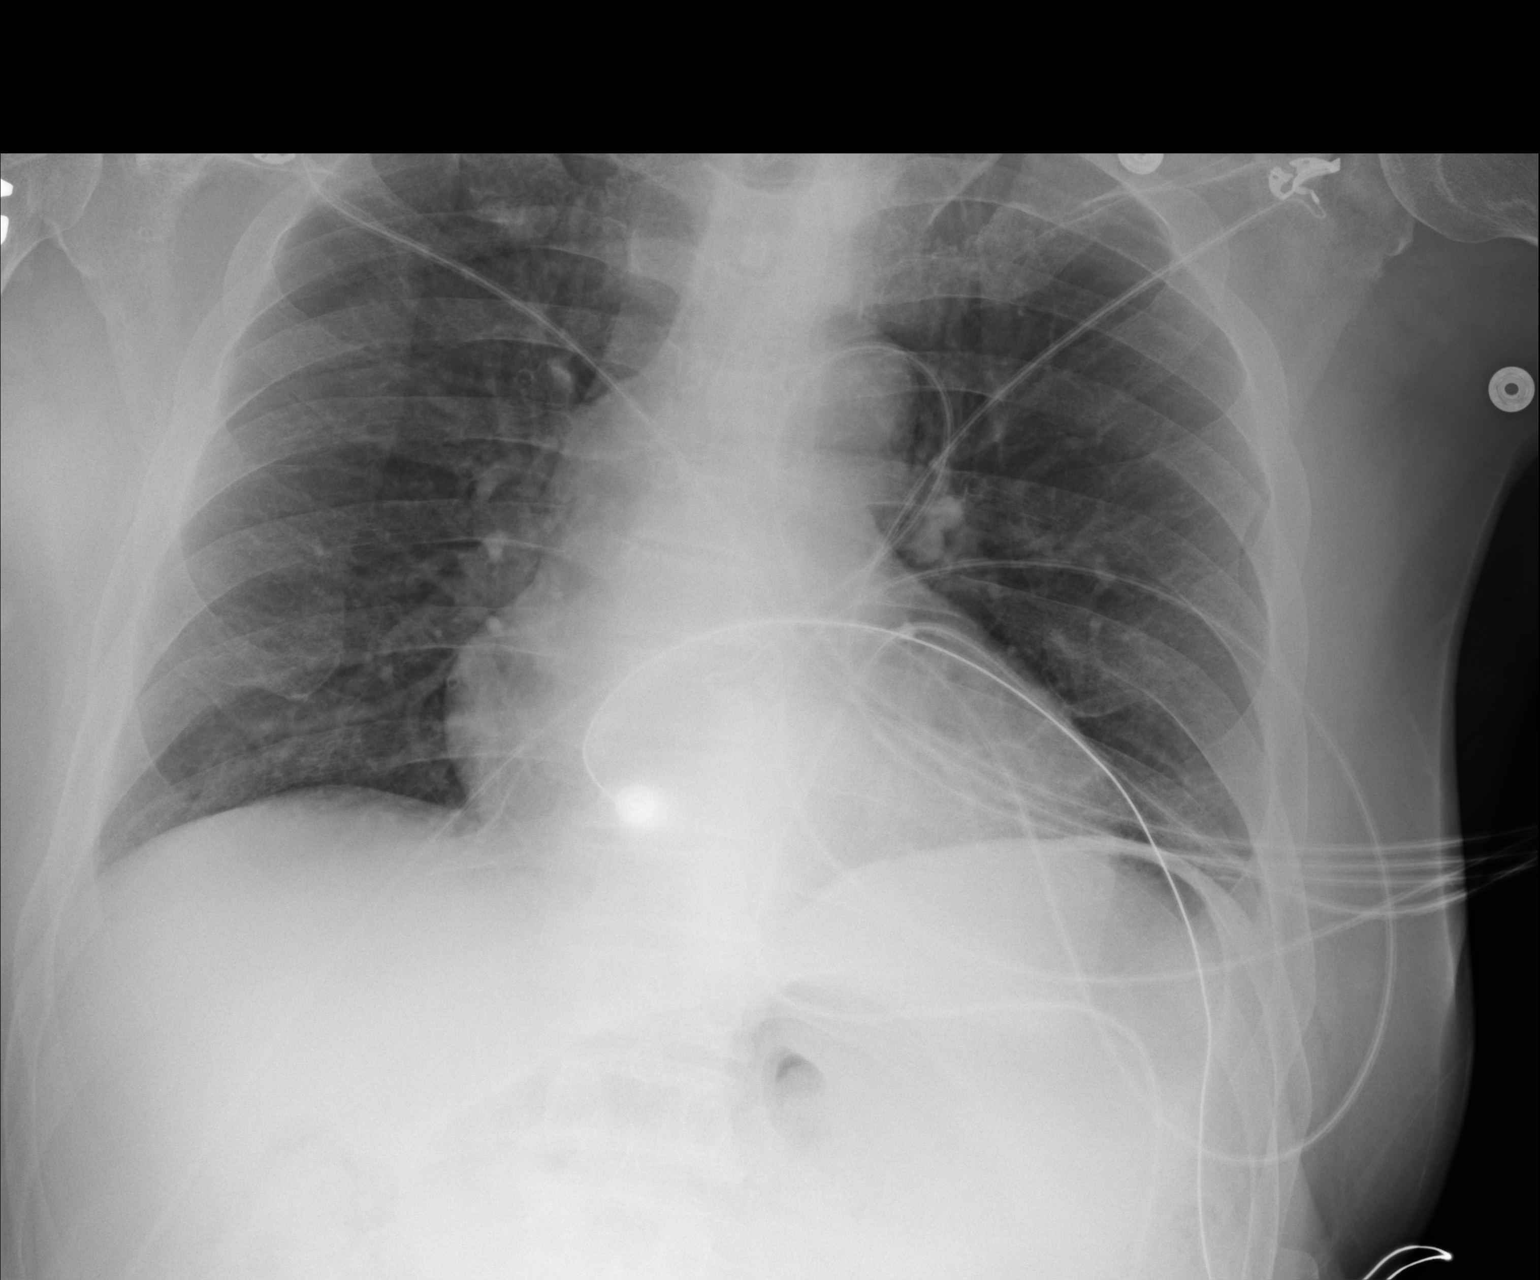

[1 of 1 positions shown; findings below may reference images not displayed]

FINDINGS: Normal mediastinum and cardiac silhouette. Normal pulmonary
vasculature. No evidence of effusion, infiltrate, or pneumothorax.
No acute bony abnormality.
IMPRESSION: No acute cardiopulmonary process.

## 2015-06-05 DIAGNOSIS — Z79899 Other long term (current) drug therapy: Secondary | ICD-10-CM | POA: Diagnosis not present

## 2015-06-05 DIAGNOSIS — E7800 Pure hypercholesterolemia, unspecified: Secondary | ICD-10-CM | POA: Diagnosis not present

## 2015-06-05 DIAGNOSIS — Z0000 Encounter for general adult medical examination without abnormal findings: Secondary | ICD-10-CM | POA: Diagnosis not present

## 2015-11-12 DIAGNOSIS — E785 Hyperlipidemia, unspecified: Secondary | ICD-10-CM | POA: Diagnosis not present

## 2015-11-12 DIAGNOSIS — I252 Old myocardial infarction: Secondary | ICD-10-CM | POA: Diagnosis not present

## 2015-11-12 DIAGNOSIS — I2510 Atherosclerotic heart disease of native coronary artery without angina pectoris: Secondary | ICD-10-CM | POA: Diagnosis not present

## 2015-11-12 DIAGNOSIS — K219 Gastro-esophageal reflux disease without esophagitis: Secondary | ICD-10-CM | POA: Diagnosis not present

## 2015-11-12 DIAGNOSIS — I10 Essential (primary) hypertension: Secondary | ICD-10-CM | POA: Diagnosis not present

## 2015-11-12 DIAGNOSIS — Z955 Presence of coronary angioplasty implant and graft: Secondary | ICD-10-CM | POA: Diagnosis not present

## 2015-11-13 DIAGNOSIS — Z23 Encounter for immunization: Secondary | ICD-10-CM | POA: Diagnosis not present

## 2016-06-22 DIAGNOSIS — Z0000 Encounter for general adult medical examination without abnormal findings: Secondary | ICD-10-CM | POA: Diagnosis not present

## 2016-06-22 DIAGNOSIS — E7800 Pure hypercholesterolemia, unspecified: Secondary | ICD-10-CM | POA: Diagnosis not present

## 2016-06-22 DIAGNOSIS — Z125 Encounter for screening for malignant neoplasm of prostate: Secondary | ICD-10-CM | POA: Diagnosis not present

## 2016-06-22 DIAGNOSIS — Z79899 Other long term (current) drug therapy: Secondary | ICD-10-CM | POA: Diagnosis not present

## 2016-11-17 DIAGNOSIS — E785 Hyperlipidemia, unspecified: Secondary | ICD-10-CM | POA: Diagnosis not present

## 2016-11-17 DIAGNOSIS — I2510 Atherosclerotic heart disease of native coronary artery without angina pectoris: Secondary | ICD-10-CM | POA: Diagnosis not present

## 2016-11-17 DIAGNOSIS — Z955 Presence of coronary angioplasty implant and graft: Secondary | ICD-10-CM | POA: Diagnosis not present

## 2016-11-17 DIAGNOSIS — I10 Essential (primary) hypertension: Secondary | ICD-10-CM | POA: Diagnosis not present

## 2016-12-03 DIAGNOSIS — Z23 Encounter for immunization: Secondary | ICD-10-CM | POA: Diagnosis not present

## 2017-07-25 DIAGNOSIS — Z125 Encounter for screening for malignant neoplasm of prostate: Secondary | ICD-10-CM | POA: Diagnosis not present

## 2017-07-25 DIAGNOSIS — Z0000 Encounter for general adult medical examination without abnormal findings: Secondary | ICD-10-CM | POA: Diagnosis not present

## 2017-07-25 DIAGNOSIS — E7800 Pure hypercholesterolemia, unspecified: Secondary | ICD-10-CM | POA: Diagnosis not present

## 2017-07-25 DIAGNOSIS — Z79899 Other long term (current) drug therapy: Secondary | ICD-10-CM | POA: Diagnosis not present

## 2017-12-20 DIAGNOSIS — Z23 Encounter for immunization: Secondary | ICD-10-CM | POA: Diagnosis not present

## 2018-02-23 NOTE — Progress Notes (Signed)
Cardiology Office Note:    Date:  02/24/2018   ID:  Carlos [name], DOB 20-Jan-1941, MRN [id number]  PCP:  Daisy Floro, MD  Cardiologist:  Norman Herrlich, MD    Referring MD: No ref. provider found    ASSESSMENT:    1. Coronary artery disease of native artery of native heart with stable angina pectoris (HCC)    PLAN:    In order of problems listed above:  1. Stable CAD continue current medical therapy New [place] Heart Association class I at this time I would not advise an ischemia evaluation 2. Stable continue his intermediate intensity statin LDL is at target at 100.   Next appointment: 1 year   Medication Adjustments/Labs and Tests Ordered: Current medicines are reviewed at length with the patient today.  Concerns regarding medicines are outlined above.  No orders of the defined types were placed in this encounter.  No orders of the defined types were placed in this encounter.   Chief Complaint  Patient presents with  . Follow-up  . Coronary Artery Disease    History of Present Illness:    Carlos [name] is a 78 y.o. male with a hx of CAD with a lateral wall myocardial infarction 05/12/2013 with PCI and stent left circumflex coronary artery.  He was last seen by Dr Donnie Aho 11/17/16. Compliance with diet, lifestyle and medications: Yes  Overall he is done well he gets vague chest tightness when he is emotionally upset dealing with the neighbor has not needed nitroglycerin.  He has no exertional angina palpitation shortness of breath syncope or TIA is pleased with the quality of his life and after discussion of benefits we decided not to do an ischemia evaluation continue medical therapy with chronic dual antiplatelet beta-blocker and high intensity statin.  Lipids performed June of this year with Dr. Tenny Craw shows a cholesterol 158 HDL 43 LDL 100 with normal liver function. Past Medical History:  Diagnosis Date  . Angina pectoris (HCC)   . CAD (coronary artery  disease) 05/14/2013  . GERD (gastroesophageal reflux disease)   . Hypercholesteremia     Past Surgical History:  Procedure Laterality Date  . ABDOMINAL SURGERY    . CARDIAC CATHETERIZATION    . CORONARY ANGIOPLASTY    . LEFT HEART CATHETERIZATION WITH CORONARY ANGIOGRAM N/A 05/12/2013   Procedure: LEFT HEART CATHETERIZATION WITH CORONARY ANGIOGRAM;  Surgeon: Micheline Chapman, MD;  Location: [facility] CATH LAB;  Service: Cardiovascular;  Laterality: N/A;  . PERCUTANEOUS STENT INTERVENTION N/A 05/12/2013   Procedure: PERCUTANEOUS STENT INTERVENTION;  Surgeon: Micheline Chapman, MD;  Location: [facility] CATH LAB;  Service: Cardiovascular;  Laterality: N/A;    Current Medications: Current Meds  Medication Sig  . aspirin EC 81 MG EC tablet Take 1 tablet (81 mg total) by mouth daily. (Patient taking differently: Take 81 mg by mouth daily. 3-4 times per week)  . b complex vitamins tablet Take 0.5 tablets by mouth once a week.  . Calcium Carbonate Antacid 400 MG CHEW Chew 800 mg by mouth as needed (heart burn).  . clopidogrel (PLAVIX) 75 MG tablet Take 75 mg by mouth daily.  . famotidine (PEPCID) 10 MG tablet Take 10 mg by mouth daily as needed for heartburn.   Marland Kitchen glucosamine-chondroitin 500-400 MG tablet Take 1 tablet by mouth daily.  . metoprolol succinate (TOPROL XL) 25 MG 24 hr tablet Take 1 tablet (25 mg total) by mouth daily.  . Multiple Vitamins-Minerals (MULTIVITAMIN WITH MINERALS) tablet Take 0.5 tablets  by mouth daily.   . nitroGLYCERIN (NITROSTAT) 0.4 MG SL tablet Place 1 tablet (0.4 mg total) under the tongue every 5 (five) minutes x 3 doses as needed for chest pain.  Marland Kitchen. omega-3 acid ethyl esters (LOVAZA) 1 G capsule Take 1 g by mouth every other day.  . simvastatin (ZOCOR) 40 MG tablet Take 40 mg by mouth daily at 6 PM.      Allergies:   Patient has no known allergies.   Social History   Socioeconomic History  . Marital status: Single    Spouse name: Not on file  . Number of children: Not on  file  . Years of education: Not on file  . Highest education level: Not on file  Occupational History  . Not on file  Social Needs  . Financial resource strain: Not on file  . Food insecurity:    Worry: Not on file    Inability: Not on file  . Transportation needs:    Medical: Not on file    Non-medical: Not on file  Tobacco Use  . Smoking status: Former Games developermoker  . Smokeless tobacco: Former Engineer, waterUser  Substance and Sexual Activity  . Alcohol use: No  . Drug use: No  . Sexual activity: Not on file  Lifestyle  . Physical activity:    Days per week: Not on file    Minutes per session: Not on file  . Stress: Not on file  Relationships  . Social connections:    Talks on phone: Not on file    Gets together: Not on file    Attends religious service: Not on file    Active member of club or organization: Not on file    Attends meetings of clubs or organizations: Not on file    Relationship status: Not on file  Other Topics Concern  . Not on file  Social History Narrative  . Not on file     Family History: The patient's family history includes Alzheimer's disease in his mother; CAD in his brother and brother. ROS:   Please see the history of present illness.    All other systems reviewed and are negative.  EKGs/Labs/Other Studies Reviewed:    The following studies were reviewed today:  EKG:  EKG ordered today.  The ekg ordered today demonstrates SRTh and normal  Recent Labs: No results found for requested labs within last 8760 hours.  Recent Lipid Panel    Component Value Date/Time   CHOL 160 05/13/2013 0211   TRIG 120 05/13/2013 0211   HDL 43 05/13/2013 0211   CHOLHDL 3.7 05/13/2013 0211   VLDL 24 05/13/2013 0211   LDLCALC 93 05/13/2013 0211    Physical Exam:    VS:  BP 124/80 (BP Location: Right Arm, Patient Position: Sitting, Cuff Size: Normal)   Pulse 67   Ht 5\' 10"  (1.778 m)   Wt 154 lb 12.8 oz (70.2 kg)   SpO2 98%   BMI 22.21 kg/m     Wt Readings from  Last 3 Encounters:  02/24/18 154 lb 12.8 oz (70.2 kg)  05/15/13 190 lb 4.1 oz (86.3 kg)     GEN:  Well nourished, well developed in no acute distress HEENT: Normal NECK: No JVD; No carotid bruits LYMPHATICS: No lymphadenopathy CARDIAC: RRR, no murmurs, rubs, gallops RESPIRATORY:  Clear to auscultation without rales, wheezing or rhonchi  ABDOMEN: Soft, non-tender, non-distended MUSCULOSKELETAL:  No edema; No deformity  SKIN: Warm and dry NEUROLOGIC:  Alert and oriented x  3 PSYCHIATRIC:  Normal affect    Signed, Norman Herrlich, MD  02/24/2018 3:13 PM    [facility]

## 2018-02-24 ENCOUNTER — Encounter: Payer: Self-pay | Admitting: Cardiology

## 2018-02-24 ENCOUNTER — Ambulatory Visit (INDEPENDENT_AMBULATORY_CARE_PROVIDER_SITE_OTHER): Payer: Medicare Other | Admitting: Cardiology

## 2018-02-24 VITALS — BP 124/80 | HR 67 | Ht 70.0 in | Wt 154.8 lb

## 2018-02-24 DIAGNOSIS — I25118 Atherosclerotic heart disease of native coronary artery with other forms of angina pectoris: Secondary | ICD-10-CM | POA: Diagnosis not present

## 2018-02-24 DIAGNOSIS — E7800 Pure hypercholesterolemia, unspecified: Secondary | ICD-10-CM

## 2018-02-24 MED ORDER — CLOPIDOGREL BISULFATE 75 MG PO TABS: 75.0000 mg | ORAL_TABLET | Freq: Every day | ORAL | 3 refills | Status: DC

## 2018-02-24 MED ORDER — METOPROLOL SUCCINATE ER 25 MG PO TB24: 25.0000 mg | ORAL_TABLET | Freq: Every day | ORAL | 3 refills | Status: DC

## 2018-02-24 NOTE — Addendum Note (Signed)
Addended by: Crist Fat on: 02/24/2018 03:32 PM   Modules accepted: Orders

## 2018-02-24 NOTE — Patient Instructions (Signed)

## 2018-08-28 DIAGNOSIS — Z0000 Encounter for general adult medical examination without abnormal findings: Secondary | ICD-10-CM | POA: Diagnosis not present

## 2018-08-28 DIAGNOSIS — Z125 Encounter for screening for malignant neoplasm of prostate: Secondary | ICD-10-CM | POA: Diagnosis not present

## 2018-08-28 DIAGNOSIS — Z79899 Other long term (current) drug therapy: Secondary | ICD-10-CM | POA: Diagnosis not present

## 2018-08-28 DIAGNOSIS — E7800 Pure hypercholesterolemia, unspecified: Secondary | ICD-10-CM | POA: Diagnosis not present

## 2019-03-04 NOTE — Progress Notes (Deleted)
Cardiology Office Note:    Date:  03/04/2019   ID:  DRAY DENTE, DOB 1940-10-30, MRN [id number]  PCP:  Lawerance Cruel, MD  Cardiologist:  Shirlee More, MD    Referring MD: Lawerance Cruel, MD    ASSESSMENT:    No diagnosis found. PLAN:    In order of problems listed above:  1. ***   Next appointment: ***   Medication Adjustments/Labs and Tests Ordered: Current medicines are reviewed at length with the patient today.  Concerns regarding medicines are outlined above.  No orders of the defined types were placed in this encounter.  No orders of the defined types were placed in this encounter.   No chief complaint on file.   History of Present Illness:    Carlos [name] is a 79 y.o. male with a hx of  CAD with a lateral wall myocardial infarction 05/12/2013 with PCI and stent left circumflex coronary artery.  He was last seen 02/24/2018. Compliance with diet, lifestyle and medications: *** Past Medical History:  Diagnosis Date  . Angina pectoris ([place])   . CAD (coronary artery disease) 05/14/2013  . GERD (gastroesophageal reflux disease)   . Hypercholesteremia     Past Surgical History:  Procedure Laterality Date  . ABDOMINAL SURGERY    . CARDIAC CATHETERIZATION    . CORONARY ANGIOPLASTY    . LEFT HEART CATHETERIZATION WITH CORONARY ANGIOGRAM N/A 05/12/2013   Procedure: LEFT HEART CATHETERIZATION WITH CORONARY ANGIOGRAM;  Surgeon: Blane Ohara, MD;  Location: [facility] CATH LAB;  Service: Cardiovascular;  Laterality: N/A;  . PERCUTANEOUS STENT INTERVENTION N/A 05/12/2013   Procedure: PERCUTANEOUS STENT INTERVENTION;  Surgeon: Blane Ohara, MD;  Location: [facility] CATH LAB;  Service: Cardiovascular;  Laterality: N/A;    Current Medications: No outpatient medications have been marked as taking for the 03/05/19 encounter (Appointment) with Richardo Priest, MD.     Allergies:   Patient has no known allergies.   Social History   Socioeconomic History  .  Marital status: Single    Spouse name: Not on file  . Number of children: Not on file  . Years of education: Not on file  . Highest education level: Not on file  Occupational History  . Not on file  Tobacco Use  . Smoking status: Former Research scientist (life sciences)  . Smokeless tobacco: Former Network engineer and Sexual Activity  . Alcohol use: No  . Drug use: No  . Sexual activity: Not on file  Other Topics Concern  . Not on file  Social History Narrative  . Not on file   Social Determinants of Health   Financial Resource Strain:   . Difficulty of Paying Living Expenses: Not on file  Food Insecurity:   . Worried About Charity fundraiser in the Last Year: Not on file  . Ran Out of Food in the Last Year: Not on file  Transportation Needs:   . Lack of Transportation (Medical): Not on file  . Lack of Transportation (Non-Medical): Not on file  Physical Activity:   . Days of Exercise per Week: Not on file  . Minutes of Exercise per Session: Not on file  Stress:   . Feeling of Stress : Not on file  Social Connections:   . Frequency of Communication with Friends and Family: Not on file  . Frequency of Social Gatherings with Friends and Family: Not on file  . Attends Religious Services: Not on file  . Active Member of Clubs or  Organizations: Not on file  . Attends Banker Meetings: Not on file  . Marital Status: Not on file     Family History: The patient's ***family history includes Alzheimer's disease in his mother; CAD in his brother and brother. ROS:   Please see the history of present illness.    All other systems reviewed and are negative.  EKGs/Labs/Other Studies Reviewed:    The following studies were reviewed today:  EKG:  EKG ordered today and personally reviewed.  The ekg ordered today demonstrates ***  Recent Labs: No results found for requested labs within last 8760 hours.  Recent Lipid Panel    Component Value Date/Time   CHOL 160 05/13/2013 0211   TRIG 120  05/13/2013 0211   HDL 43 05/13/2013 0211   CHOLHDL 3.7 05/13/2013 0211   VLDL 24 05/13/2013 0211   LDLCALC 93 05/13/2013 0211    Physical Exam:    VS:  There were no vitals taken for this visit.    Wt Readings from Last 3 Encounters:  02/24/18 154 lb 12.8 oz (70.2 kg)  05/15/13 190 lb 4.1 oz (86.3 kg)     GEN: *** Well nourished, well developed in no acute distress HEENT: Normal NECK: No JVD; No carotid bruits LYMPHATICS: No lymphadenopathy CARDIAC: ***RRR, no murmurs, rubs, gallops RESPIRATORY:  Clear to auscultation without rales, wheezing or rhonchi  ABDOMEN: Soft, non-tender, non-distended MUSCULOSKELETAL:  No edema; No deformity  SKIN: Warm and dry NEUROLOGIC:  Alert and oriented x 3 PSYCHIATRIC:  Normal affect    Signed, Norman Herrlich, MD  03/04/2019 1:54 PM    [facility]

## 2019-03-05 ENCOUNTER — Ambulatory Visit: Payer: Medicare Other | Admitting: Cardiology

## 2019-03-05 NOTE — Progress Notes (Signed)
Cardiology Office Note:    Date:  03/06/2019   ID:  [name], DOB 12/24/1940, MRN [id number]  PCP:  Lawerance Cruel, MD  Cardiologist:  Shirlee More, MD    Referring MD: Lawerance Cruel, MD    ASSESSMENT:    1. Coronary artery disease of native artery of native heart with stable angina pectoris ([place])   2. Pure hypercholesterolemia   3. Gastroesophageal reflux disease without esophagitis    PLAN:    In order of problems listed above:  1. Stable CAD New York Heart Association class I continue current treatment streamlining to single antiplatelet clopidogrel beta-blocker and lipid-lowering with statin.  At this time the patient I do not feel he requires an ischemia evaluation.  He has fresh nitroglycerin if needed. 2. Stable continue statin lipids at target LDL less than 100. 3. Stable and asymptomatic best to decrease to single antiplatelet agent   Next appointment: 6 months   Medication Adjustments/Labs and Tests Ordered: Current medicines are reviewed at length with the patient today.  Concerns regarding medicines are outlined above.  No orders of the defined types were placed in this encounter.  No orders of the defined types were placed in this encounter.   Chief Complaint  Patient presents with  . Coronary Artery Disease    History of Present Illness:    Carlos [name] is a 79 y.o. male with a hx of Carlos [name] is a 79 y.o. male with a hx of CAD with a lateral wall myocardial infarction 05/12/2013 with PCI and stent left circumflex coronary artery.  He was last seen 02/24/2018 with stable CAD.  He has been maintained on long-term dual antiplatelet without bleeding complication. Compliance with diet, lifestyle and medications: Yes  He takes aspirin really angry asked him to discontinue.  He remains on clopidogrel.  Rarely does he of angina mostly when he has an argument with a neighbor and has not occurred for several months he has no exertional  chest pain shortness of breath palpitation or syncope and tolerates his medications including statin without GI side effects muscle pain or weakness.  Most recent labs 08/28/2018 cholesterol 153 HDL 46 and LDL at target 91 triglycerides are 80.  Liver function test were normal. Past Medical History:  Diagnosis Date  . Angina pectoris ([place])   . CAD (coronary artery disease) 05/14/2013  . GERD (gastroesophageal reflux disease)   . Hypercholesteremia     Past Surgical History:  Procedure Laterality Date  . ABDOMINAL SURGERY    . CARDIAC CATHETERIZATION    . CORONARY ANGIOPLASTY    . LEFT HEART CATHETERIZATION WITH CORONARY ANGIOGRAM N/A 05/12/2013   Procedure: LEFT HEART CATHETERIZATION WITH CORONARY ANGIOGRAM;  Surgeon: Blane Ohara, MD;  Location: [facility] CATH LAB;  Service: Cardiovascular;  Laterality: N/A;  . PERCUTANEOUS STENT INTERVENTION N/A 05/12/2013   Procedure: PERCUTANEOUS STENT INTERVENTION;  Surgeon: Blane Ohara, MD;  Location: [facility] CATH LAB;  Service: Cardiovascular;  Laterality: N/A;    Current Medications: Current Meds  Medication Sig  . aspirin EC 81 MG tablet Take 81 mg by mouth as directed.  Marland Kitchen b complex vitamins tablet Take 0.5 tablets by mouth once a week.  . Calcium Carbonate Antacid 400 MG CHEW Chew 800 mg by mouth as needed (heart burn).  . clopidogrel (PLAVIX) 75 MG tablet Take 1 tablet (75 mg total) by mouth daily.  Marland Kitchen GLUCOSAMINE-CHONDROITIN PO Take 1,000 mg by mouth as needed.  . metoprolol succinate (TOPROL  XL) 25 MG 24 hr tablet Take 1 tablet (25 mg total) by mouth daily.  . Multiple Vitamins-Minerals (MULTIVITAMIN WITH MINERALS) tablet Take 0.5 tablets by mouth daily.   . nitroGLYCERIN (NITROSTAT) 0.4 MG SL tablet Place 1 tablet (0.4 mg total) under the tongue every 5 (five) minutes x 3 doses as needed for chest pain.  . Omega-3 Fatty Acids (FISH OIL PO) Take by mouth as directed.  . simvastatin (ZOCOR) 40 MG tablet Take 40 mg by mouth daily at 6 PM.   .  [DISCONTINUED] famotidine (PEPCID) 10 MG tablet Take 10 mg by mouth daily as needed for heartburn.      Allergies:   Patient has no known allergies.   Social History   Socioeconomic History  . Marital status: Single    Spouse name: Not on file  . Number of children: Not on file  . Years of education: Not on file  . Highest education level: Not on file  Occupational History  . Not on file  Tobacco Use  . Smoking status: Former Games developer  . Smokeless tobacco: Former Engineer, water and Sexual Activity  . Alcohol use: No  . Drug use: No  . Sexual activity: Not on file  Other Topics Concern  . Not on file  Social History Narrative  . Not on file   Social Determinants of Health   Financial Resource Strain:   . Difficulty of Paying Living Expenses: Not on file  Food Insecurity:   . Worried About Programme researcher, broadcasting/film/video in the Last Year: Not on file  . Ran Out of Food in the Last Year: Not on file  Transportation Needs:   . Lack of Transportation (Medical): Not on file  . Lack of Transportation (Non-Medical): Not on file  Physical Activity:   . Days of Exercise per Week: Not on file  . Minutes of Exercise per Session: Not on file  Stress:   . Feeling of Stress : Not on file  Social Connections:   . Frequency of Communication with Friends and Family: Not on file  . Frequency of Social Gatherings with Friends and Family: Not on file  . Attends Religious Services: Not on file  . Active Member of Clubs or Organizations: Not on file  . Attends Banker Meetings: Not on file  . Marital Status: Not on file     Family History: The patient's family history includes Alzheimer's disease in his mother; CAD in his brother and brother. ROS:   Please see the history of present illness.    All other systems reviewed and are negative.  EKGs/Labs/Other Studies Reviewed:    The following studies were reviewed today:  EKG:  EKG ordered today and personally reviewed.  The ekg  ordered today demonstrates sinus rhythm occasional APCs otherwise normal    Physical Exam:    VS:  BP 120/64 (BP Location: Left Arm, Patient Position: Sitting, Cuff Size: Normal)   Pulse (!) 54   Ht 5\' 10"  (1.778 m)   Wt 151 lb (68.5 kg)   SpO2 97%   BMI 21.67 kg/m     Wt Readings from Last 3 Encounters:  03/06/19 151 lb (68.5 kg)  02/24/18 154 lb 12.8 oz (70.2 kg)  05/15/13 190 lb 4.1 oz (86.3 kg)     GEN:  Well nourished, well developed in no acute distress HEENT: Normal NECK: No JVD; No carotid bruits LYMPHATICS: No lymphadenopathy CARDIAC: RRR, no murmurs, rubs, gallops RESPIRATORY:  Clear to  auscultation without rales, wheezing or rhonchi  ABDOMEN: Soft, non-tender, non-distended MUSCULOSKELETAL:  No edema; No deformity  SKIN: Warm and dry NEUROLOGIC:  Alert and oriented x 3 PSYCHIATRIC:  Normal affect    Signed, Norman Herrlich, MD  03/06/2019 1:33 PM    [facility]

## 2019-03-06 ENCOUNTER — Other Ambulatory Visit: Payer: Self-pay

## 2019-03-06 ENCOUNTER — Ambulatory Visit (INDEPENDENT_AMBULATORY_CARE_PROVIDER_SITE_OTHER): Payer: Medicare Other | Admitting: Cardiology

## 2019-03-06 ENCOUNTER — Encounter: Payer: Self-pay | Admitting: Cardiology

## 2019-03-06 VITALS — BP 120/64 | HR 54 | Ht 70.0 in | Wt 151.0 lb

## 2019-03-06 DIAGNOSIS — E7800 Pure hypercholesterolemia, unspecified: Secondary | ICD-10-CM | POA: Diagnosis not present

## 2019-03-06 DIAGNOSIS — I25118 Atherosclerotic heart disease of native coronary artery with other forms of angina pectoris: Secondary | ICD-10-CM | POA: Diagnosis not present

## 2019-03-06 DIAGNOSIS — K219 Gastro-esophageal reflux disease without esophagitis: Secondary | ICD-10-CM

## 2019-03-06 MED ORDER — METOPROLOL SUCCINATE ER 25 MG PO TB24: 25.0000 mg | ORAL_TABLET | Freq: Every day | ORAL | 3 refills | Status: DC

## 2019-03-06 MED ORDER — CLOPIDOGREL BISULFATE 75 MG PO TABS: 75.0000 mg | ORAL_TABLET | Freq: Every day | ORAL | 3 refills | Status: DC

## 2019-03-06 NOTE — Patient Instructions (Signed)
Medication Instructions:  Your physician has recommended you make the following change in your medication:  STOP: Aspirin 81 MG   *If you need a refill on your cardiac medications before your next appointment, please call your pharmacy*  Lab Work: None  If you have labs (blood work) drawn today and your tests are completely normal, you will receive your results only by: Marland Kitchen MyChart Message (if you have MyChart) OR . A paper copy in the mail If you have any lab test that is abnormal or we need to change your treatment, we will call you to review the results.  Testing/Procedures: None  Follow-Up: At [facility], you and your health needs are our priority.  As part of our continuing mission to provide you with exceptional heart care, we have created designated Provider Care Teams.  These Care Teams include your primary Cardiologist (physician) and Advanced Practice Providers (APPs -  Physician Assistants and Nurse Practitioners) who all work together to provide you with the care you need, when you need it.  Your next appointment:   6 month(s)  The format for your next appointment:   In Person  Provider:   Norman Herrlich, MD  Other Instructions Contact [facility] for Covid-19 vaccine.

## 2019-08-30 DIAGNOSIS — Z79899 Other long term (current) drug therapy: Secondary | ICD-10-CM | POA: Diagnosis not present

## 2019-08-30 DIAGNOSIS — E7800 Pure hypercholesterolemia, unspecified: Secondary | ICD-10-CM | POA: Diagnosis not present

## 2019-08-30 DIAGNOSIS — R2689 Other abnormalities of gait and mobility: Secondary | ICD-10-CM | POA: Diagnosis not present

## 2019-08-30 DIAGNOSIS — M1990 Unspecified osteoarthritis, unspecified site: Secondary | ICD-10-CM | POA: Diagnosis not present

## 2019-12-03 DIAGNOSIS — M179 Osteoarthritis of knee, unspecified: Secondary | ICD-10-CM | POA: Diagnosis not present

## 2019-12-03 DIAGNOSIS — Z0000 Encounter for general adult medical examination without abnormal findings: Secondary | ICD-10-CM | POA: Diagnosis not present

## 2019-12-03 DIAGNOSIS — D485 Neoplasm of uncertain behavior of skin: Secondary | ICD-10-CM | POA: Diagnosis not present

## 2019-12-03 DIAGNOSIS — Z23 Encounter for immunization: Secondary | ICD-10-CM | POA: Diagnosis not present

## 2020-01-31 DIAGNOSIS — E7800 Pure hypercholesterolemia, unspecified: Secondary | ICD-10-CM | POA: Insufficient documentation

## 2020-01-31 DIAGNOSIS — K219 Gastro-esophageal reflux disease without esophagitis: Secondary | ICD-10-CM | POA: Insufficient documentation

## 2020-01-31 DIAGNOSIS — I209 Angina pectoris, unspecified: Secondary | ICD-10-CM | POA: Insufficient documentation

## 2020-02-22 NOTE — Progress Notes (Deleted)
Cardiology Office Note:    Date:  02/22/2020   ID:  [name], DOB 1940-04-22, MRN [id number]  PCP:  Daisy Floro, MD  Cardiologist:  Norman Herrlich, MD    Referring MD: Daisy Floro, MD    ASSESSMENT:    No diagnosis found. PLAN:    In order of problems listed above:  1. ***   Next appointment: ***   Medication Adjustments/Labs and Tests Ordered: Current medicines are reviewed at length with the patient today.  Concerns regarding medicines are outlined above.  No orders of the defined types were placed in this encounter.  No orders of the defined types were placed in this encounter.   No chief complaint on file.   History of Present Illness:    Carlos [name] is a 79 y.o. male with a hx of D with lateral MI 05/12/2013 and PCI and stent to left circumflex coronary artery.  He was last seen 03/06/2019. Compliance with diet, lifestyle and medications: *** Past Medical History:  Diagnosis Date  . Angina pectoris ([facility])   . CAD (coronary artery disease) 05/14/2013  . GERD (gastroesophageal reflux disease)   . Hypercholesteremia   . Hyperlipidemia 05/14/2013  . ST elevation myocardial infarction (STEMI) of lateral wall, initial episode of care [facility]) 05/12/2013    Past Surgical History:  Procedure Laterality Date  . ABDOMINAL SURGERY    . CARDIAC CATHETERIZATION    . CORONARY ANGIOPLASTY    . LEFT HEART CATHETERIZATION WITH CORONARY ANGIOGRAM N/A 05/12/2013   Procedure: LEFT HEART CATHETERIZATION WITH CORONARY ANGIOGRAM;  Surgeon: Micheline Chapman, MD;  Location[facility] CATH LAB;  Service: Cardiovascular;  Laterality: N/A;  . PERCUTANEOUS STENT INTERVENTION N/A 05/12/2013   Procedure: PERCUTANEOUS STENT INTERVENTION;  Surgeon: Micheline Chapman, MD;  Location: [facility] CATH LAB;  Service: Cardiovascular;  Laterality: N/A;    Current Medications: No outpatient medications have been marked as taking for the 02/25/20 encounter (Appointment) with Baldo Daub, MD.      Allergies:   Patient has no known allergies.   Social History   Socioeconomic History  . Marital status: Single    Spouse name: Not on file  . Number of children: Not on file  . Years of education: Not on file  . Highest education level: Not on file  Occupational History  . Not on file  Tobacco Use  . Smoking status: Former Games developer  . Smokeless tobacco: Former Clinical biochemist  . Vaping Use: Never used  Substance and Sexual Activity  . Alcohol use: No  . Drug use: No  . Sexual activity: Not on file  Other Topics Concern  . Not on file  Social History Narrative  . Not on file   Social Determinants of Health   Financial Resource Strain: Not on file  Food Insecurity: Not on file  Transportation Needs: Not on file  Physical Activity: Not on file  Stress: Not on file  Social Connections: Not on file     Family History: The patient's ***family history includes Alzheimer's disease in his mother; CAD in his brother and brother. ROS:   Please see the history of present illness.    All other systems reviewed and are negative.  EKGs/Labs/Other Studies Reviewed:    The following studies were reviewed today:  EKG:  EKG ordered today and personally reviewed.  The ekg ordered today demonstrates ***  Recent Labs: No results found for requested labs within last 8760 hours.  Recent Lipid  Panel    Component Value Date/Time   CHOL 160 05/13/2013 0211   TRIG 120 05/13/2013 0211   HDL 43 05/13/2013 0211   CHOLHDL 3.7 05/13/2013 0211   VLDL 24 05/13/2013 0211   LDLCALC 93 05/13/2013 0211    Physical Exam:    VS:  There were no vitals taken for this visit.    Wt Readings from Last 3 Encounters:  03/06/19 151 lb (68.5 kg)  02/24/18 154 lb 12.8 oz (70.2 kg)  05/15/13 190 lb 4.1 oz (86.3 kg)     GEN: *** Well nourished, well developed in no acute distress HEENT: Normal NECK: No JVD; No carotid bruits LYMPHATICS: No lymphadenopathy CARDIAC: ***RRR, no murmurs,  rubs, gallops RESPIRATORY:  Clear to auscultation without rales, wheezing or rhonchi  ABDOMEN: Soft, non-tender, non-distended MUSCULOSKELETAL:  No edema; No deformity  SKIN: Warm and dry NEUROLOGIC:  Alert and oriented x 3 PSYCHIATRIC:  Normal affect    Signed, Norman Herrlich, MD  02/22/2020 10:44 AM    [facility]

## 2020-02-25 ENCOUNTER — Ambulatory Visit: Payer: Medicare Other | Admitting: Cardiology

## 2020-02-27 ENCOUNTER — Other Ambulatory Visit: Payer: Self-pay

## 2020-02-27 ENCOUNTER — Ambulatory Visit: Payer: Medicare Other | Admitting: Cardiology

## 2020-02-27 ENCOUNTER — Encounter: Payer: Self-pay | Admitting: Cardiology

## 2020-02-27 VITALS — BP 106/70 | HR 75 | Ht 70.0 in | Wt 149.0 lb

## 2020-02-27 DIAGNOSIS — I25118 Atherosclerotic heart disease of native coronary artery with other forms of angina pectoris: Secondary | ICD-10-CM

## 2020-02-27 DIAGNOSIS — E7800 Pure hypercholesterolemia, unspecified: Secondary | ICD-10-CM

## 2020-02-27 MED ORDER — CLOPIDOGREL BISULFATE 75 MG PO TABS: 75.0000 mg | ORAL_TABLET | Freq: Every day | ORAL | 3 refills | Status: DC

## 2020-02-27 MED ORDER — METOPROLOL SUCCINATE ER 25 MG PO TB24: 25.0000 mg | ORAL_TABLET | Freq: Every day | ORAL | 3 refills | Status: DC

## 2020-02-27 NOTE — Patient Instructions (Signed)

## 2020-02-27 NOTE — Progress Notes (Signed)
Cardiology Office Note:    Date:  02/27/2020   ID:  Carlos [name], DOB 05/30/1940, MRN [id number]  PCP:  Daisy Floro, MD  Cardiologist:  Norman Herrlich, MD    Referring MD: Daisy Floro, MD    ASSESSMENT:    1. 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 Pure hypercholesterolemia    PLAN:    In order of problems listed above:  1. He has done well remains asymptomatic on current medical therapy continue aspirin beta-blocker statin New York Heart Association class I at this time I do not think he requires an ischemia evaluation in the absence of angina. 2. Stable tolerates intermediate intensity statin with good lipid profile continue the same 3. We renewed his prescriptions for clopidogrel and metoprolol   Next appointment: 1 year   Medication Adjustments/Labs and Tests Ordered: Current medicines are reviewed at length with the patient today.  Concerns regarding medicines are outlined above.  Orders Placed This Encounter  Procedures  . EKG 12-Lead   Meds ordered this encounter  Medications  . clopidogrel (PLAVIX) 75 MG tablet    Sig: Take 1 tablet (75 mg total) by mouth daily.    Dispense:  90 tablet    Refill:  3  . metoprolol succinate (TOPROL XL) 25 MG 24 hr tablet    Sig: Take 1 tablet (25 mg total) by mouth daily.    Dispense:  90 tablet    Refill:  3    Chief Complaint  Patient presents with  . Follow-up  . Coronary Artery Disease    History of Present Illness:    Carlos [name] is a 80 y.o. male with a hx of CAD with a lateral wall myocardial infarction 05/12/2013 with PCI and stent left circumflex coronary artery, hyperlipidemia and gastroesophageal reflux disease.  He was last seen 03/06/2019. Compliance with diet, lifestyle and medications: Yes  He had a good year no cardiac problems compliant with medications no side effects from the statin with muscle pain or weakness and has had no  angina dyspnea palpitation or syncope.  I reviewed his labs with him his lipids are at target.  Recent labs 12/03/2019: CBC normal hemoglobin 14.4 platelets 258,000 CMP normal creatinine 0.95 GFR 92 cc potassium 4.4 normal liver function test TSH normal 2.73 Lipid profile cholesterol 172 LDL 104 triglycerides 77 HDL 54  Past Medical History:  Diagnosis Date  . Angina pectoris ([facility])   . CAD (coronary artery disease) 05/14/2013  . GERD (gastroesophageal reflux disease)   . Hypercholesteremia   . Hyperlipidemia 05/14/2013  . ST elevation myocardial infarction (STEMI) of lateral wall, initial episode of care [facility]) 05/12/2013    Past Surgical History:  Procedure Laterality Date  . ABDOMINAL SURGERY    . CARDIAC CATHETERIZATION    . CORONARY ANGIOPLASTY    . LEFT HEART CATHETERIZATION WITH CORONARY ANGIOGRAM N/A 05/12/2013   Procedure: LEFT HEART CATHETERIZATION WITH CORONARY ANGIOGRAM;  Surgeon: Micheline Chapman, MD;  Location: [facility] CATH LAB;  Service: Cardiovascular;  Laterality: N/A;  . PERCUTANEOUS STENT INTERVENTION N/A 05/12/2013   Procedure: PERCUTANEOUS STENT INTERVENTION;  Surgeon: Micheline Chapman, MD;  Location: [facility] CATH LAB;  Service: Cardiovascular;  Laterality: N/A;    Current Medications: Current Meds  Medication Sig  . b complex vitamins tablet Take 0.5 tablets by mouth once a week.  . Calcium Carbonate Antacid 400 MG CHEW Chew 800 mg by mouth as needed (  heart burn).  Marland Kitchen GLUCOSAMINE-CHONDROITIN PO Take 1,000 mg by mouth as needed.  . Multiple Vitamins-Minerals (MULTIVITAMIN WITH MINERALS) tablet Take 0.5 tablets by mouth daily.   . nitroGLYCERIN (NITROSTAT) 0.4 MG SL tablet Place 1 tablet (0.4 mg total) under the tongue every 5 (five) minutes x 3 doses as needed for chest pain.  . simvastatin (ZOCOR) 40 MG tablet Take 40 mg by mouth daily at 6 PM.   . [DISCONTINUED] clopidogrel (PLAVIX) 75 MG tablet Take 1 tablet (75 mg total) by mouth daily.  . [DISCONTINUED] metoprolol  succinate (TOPROL XL) 25 MG 24 hr tablet Take 1 tablet (25 mg total) by mouth daily.     Allergies:   Patient has no known allergies.   Social History   Socioeconomic History  . Marital status: Single    Spouse name: Not on file  . Number of children: Not on file  . Years of education: Not on file  . Highest education level: Not on file  Occupational History  . Not on file  Tobacco Use  . Smoking status: Former Games developer  . Smokeless tobacco: Former Clinical biochemist  . Vaping Use: Never used  Substance and Sexual Activity  . Alcohol use: No  . Drug use: No  . Sexual activity: Not on file  Other Topics Concern  . Not on file  Social History Narrative  . Not on file   Social Determinants of Health   Financial Resource Strain: Not on file  Food Insecurity: Not on file  Transportation Needs: Not on file  Physical Activity: Not on file  Stress: Not on file  Social Connections: Not on file     Family History: The patient's family history includes Alzheimer's disease in his mother; CAD in his brother and brother. ROS:   Please see the history of present illness.    All other systems reviewed and are negative.  EKGs/Labs/Other Studies Reviewed:    The following studies were reviewed today:  EKG:  EKG ordered today and personally reviewed.  The ekg ordered today demonstrates sinus rhythm normal EKG   Physical Exam:    VS:  BP 106/70   Pulse 75   Ht 5\' 10"  (1.778 m)   Wt 149 lb (67.6 kg)   SpO2 99%   BMI 21.38 kg/m     Wt Readings from Last 3 Encounters:  02/27/20 149 lb (67.6 kg)  03/06/19 151 lb (68.5 kg)  02/24/18 154 lb 12.8 oz (70.2 kg)     GEN: Thin well nourished, well developed in no acute distress he appears his age HEENT: Normal NECK: No JVD; No carotid bruits LYMPHATICS: No lymphadenopathy CARDIAC: RRR, no murmurs, rubs, gallops RESPIRATORY:  Clear to auscultation without rales, wheezing or rhonchi  ABDOMEN: Soft, non-tender,  non-distended MUSCULOSKELETAL:  No edema; No deformity  SKIN: Warm and dry NEUROLOGIC:  Alert and oriented x 3 PSYCHIATRIC:  Normal affect    Signed, 04/25/18, MD  02/27/2020 3:57 PM    [facility]

## 2020-12-02 DIAGNOSIS — L0100 Impetigo, unspecified: Secondary | ICD-10-CM | POA: Diagnosis not present

## 2020-12-03 DIAGNOSIS — Z79899 Other long term (current) drug therapy: Secondary | ICD-10-CM | POA: Diagnosis not present

## 2020-12-03 DIAGNOSIS — E7800 Pure hypercholesterolemia, unspecified: Secondary | ICD-10-CM | POA: Diagnosis not present

## 2020-12-16 DIAGNOSIS — C44319 Basal cell carcinoma of skin of other parts of face: Secondary | ICD-10-CM | POA: Diagnosis not present

## 2020-12-17 DIAGNOSIS — K4090 Unilateral inguinal hernia, without obstruction or gangrene, not specified as recurrent: Secondary | ICD-10-CM | POA: Diagnosis not present

## 2020-12-17 DIAGNOSIS — R413 Other amnesia: Secondary | ICD-10-CM | POA: Diagnosis not present

## 2020-12-17 DIAGNOSIS — Z0000 Encounter for general adult medical examination without abnormal findings: Secondary | ICD-10-CM | POA: Diagnosis not present

## 2020-12-17 DIAGNOSIS — E7800 Pure hypercholesterolemia, unspecified: Secondary | ICD-10-CM | POA: Diagnosis not present

## 2020-12-30 DIAGNOSIS — C44319 Basal cell carcinoma of skin of other parts of face: Secondary | ICD-10-CM | POA: Diagnosis not present

## 2021-01-07 ENCOUNTER — Telehealth: Payer: Self-pay | Admitting: Radiation Oncology

## 2021-01-07 NOTE — Telephone Encounter (Signed)
LMV to schedule consult

## 2021-01-08 ENCOUNTER — Telehealth: Payer: Self-pay | Admitting: Radiation Oncology

## 2021-01-08 NOTE — Telephone Encounter (Signed)
LVM to schedule consult

## 2021-01-09 ENCOUNTER — Telehealth: Payer: Self-pay | Admitting: Radiation Oncology

## 2021-01-09 NOTE — Telephone Encounter (Signed)
LVM to schedule consult

## 2021-01-12 ENCOUNTER — Telehealth: Payer: Self-pay | Admitting: Radiation Oncology

## 2021-01-12 NOTE — Telephone Encounter (Signed)
LVM to schedule with Dr. Roselind Messier.

## 2021-01-13 ENCOUNTER — Telehealth: Payer: Self-pay | Admitting: Radiation Oncology

## 2021-02-12 DIAGNOSIS — C44319 Basal cell carcinoma of skin of other parts of face: Secondary | ICD-10-CM | POA: Diagnosis not present

## 2021-02-12 DIAGNOSIS — D485 Neoplasm of uncertain behavior of skin: Secondary | ICD-10-CM | POA: Diagnosis not present

## 2021-04-03 DIAGNOSIS — C44319 Basal cell carcinoma of skin of other parts of face: Secondary | ICD-10-CM | POA: Diagnosis not present

## 2021-05-04 DIAGNOSIS — L568 Other specified acute skin changes due to ultraviolet radiation: Secondary | ICD-10-CM | POA: Diagnosis not present

## 2021-05-04 DIAGNOSIS — L905 Scar conditions and fibrosis of skin: Secondary | ICD-10-CM | POA: Diagnosis not present

## 2021-07-14 ENCOUNTER — Telehealth: Payer: Self-pay | Admitting: Cardiology

## 2021-07-14 NOTE — Telephone Encounter (Signed)
Advised to call PCP for refills as we have not saw the pt since 02/2020. Pt verbalized understanding and had no additional questions.

## 2021-07-14 NOTE — Telephone Encounter (Signed)
*  STAT* If patient is at the pharmacy, call can be transferred to refill team.   1. Which medications need to be refilled? (please list name of each medication and dose if known)  metoprolol succinate (TOPROL XL) 25 MG 24 hr tablet clopidogrel (PLAVIX) 75 MG tablet simvastatin (ZOCOR) 40 MG tablet  2. Which pharmacy/location (including street and city if local pharmacy) is medication to be sent to? [facility] [address] [place] - [address]  3. Do they need a 30 day or 90 day supply? 90 day  Patient has appt on 09/30/21

## 2021-07-15 ENCOUNTER — Other Ambulatory Visit: Payer: Self-pay | Admitting: Cardiology

## 2021-09-29 ENCOUNTER — Other Ambulatory Visit: Payer: Self-pay

## 2021-09-29 DIAGNOSIS — Z125 Encounter for screening for malignant neoplasm of prostate: Secondary | ICD-10-CM

## 2021-09-29 DIAGNOSIS — F03911 Unspecified dementia, unspecified severity, with agitation: Secondary | ICD-10-CM | POA: Diagnosis present

## 2021-09-29 DIAGNOSIS — Z79899 Other long term (current) drug therapy: Secondary | ICD-10-CM

## 2021-09-29 DIAGNOSIS — R413 Other amnesia: Secondary | ICD-10-CM

## 2021-09-29 DIAGNOSIS — R2689 Other abnormalities of gait and mobility: Secondary | ICD-10-CM

## 2021-09-29 DIAGNOSIS — M179 Osteoarthritis of knee, unspecified: Secondary | ICD-10-CM

## 2021-09-30 ENCOUNTER — Ambulatory Visit: Payer: Medicare Other | Admitting: Cardiology

## 2021-09-30 ENCOUNTER — Encounter: Payer: Self-pay | Admitting: Cardiology

## 2021-09-30 VITALS — BP 98/62 | HR 70 | Ht 70.0 in | Wt 134.0 lb

## 2021-09-30 DIAGNOSIS — E782 Mixed hyperlipidemia: Secondary | ICD-10-CM | POA: Diagnosis not present

## 2021-09-30 DIAGNOSIS — I25118 Atherosclerotic heart disease of native coronary artery with other forms of angina pectoris: Secondary | ICD-10-CM

## 2021-09-30 MED ORDER — METOPROLOL SUCCINATE ER 25 MG PO TB24: 25.0000 mg | ORAL_TABLET | Freq: Every day | ORAL | 3 refills | Status: DC

## 2021-09-30 MED ORDER — CLOPIDOGREL BISULFATE 75 MG PO TABS: 75.0000 mg | ORAL_TABLET | Freq: Every day | ORAL | 3 refills | Status: AC

## 2021-09-30 MED ORDER — NITROGLYCERIN 0.4 MG SL SUBL: 0.4000 mg | SUBLINGUAL_TABLET | SUBLINGUAL | 11 refills | Status: AC | PRN

## 2021-09-30 NOTE — Progress Notes (Signed)
Cardiology Office Note:    Date:  09/30/2021   ID:  Carlos [name], DOB 1940/05/01, MRN [id number]  PCP:  Daisy Floro, MD  Cardiologist:  Norman Herrlich, MD    Referring MD: Daisy Floro, MD    ASSESSMENT:    1. 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 Mixed hyperlipidemia    PLAN:    In order of problems listed above:  Carlos [name] has chronic stable CAD doing well with rare angina continue his medical therapy with single antiplatelet clopidogrel beta-blocker and statin.  At this time would not advise an ischemia evaluation Continue statin LDL is at target I would not intensify lipid-lowering treatment with his general frailty   Next appointment: 6 months he request to be seen at [facility] its easier for him to access.   Medication Adjustments/Labs and Tests Ordered: Current medicines are reviewed at length with the patient today.  Concerns regarding medicines are outlined above.  No orders of the defined types were placed in this encounter.  No orders of the defined types were placed in this encounter.   Chief Complaint  Patient presents with   Follow-up   Congestive Heart Failure    History of Present Illness:    Carlos [name] is a 81 y.o. male with a hx of CAD with a lateral wall myocardial infarction 05/12/2013 with PCI and stent left circumflex coronary artery, hyperlipidemia and gastroesophageal reflux disease last seen 02/27/2020.  Overall he is does well he says once in a blue moon he takes a nitroglycerin with relief typical angina with or without activity and request a refill.  His pattern stable he has not had edema shortness of breath palpitation or syncope.  He tolerates his statin without muscle pain or weakness.  Compliance with diet, lifestyle and medications: Yes Past Medical History:  Diagnosis Date   Amnesia 09/29/2021   Angina pectoris (HCC)    CAD (coronary artery disease)  05/14/2013   GERD (gastroesophageal reflux disease)    Hypercholesteremia    Hyperlipidemia 05/14/2013   Osteoarthritis of knee 09/29/2021   Other long term (current) drug therapy 09/29/2021   Poor balance 09/29/2021   Screening for malignant neoplasm of prostate 09/29/2021   ST elevation myocardial infarction (STEMI) of lateral wall, initial episode of care (HCC) 05/12/2013    Past Surgical History:  Procedure Laterality Date   ABDOMINAL SURGERY     CARDIAC CATHETERIZATION     CORONARY ANGIOPLASTY     LEFT HEART CATHETERIZATION WITH CORONARY ANGIOGRAM N/A 05/12/2013   Procedure: LEFT HEART CATHETERIZATION WITH CORONARY ANGIOGRAM;  Surgeon: Micheline Chapman, MD;  Location: [facility] CATH LAB;  Service: Cardiovascular;  Laterality: N/A;   PERCUTANEOUS STENT INTERVENTION N/A 05/12/2013   Procedure: PERCUTANEOUS STENT INTERVENTION;  Surgeon: Micheline Chapman, MD;  Location: [facility] CATH LAB;  Service: Cardiovascular;  Laterality: N/A;    Current Medications: Current Meds  Medication Sig   b complex vitamins tablet Take 0.5 tablets by mouth once a week.   Calcium Carbonate Antacid 400 MG CHEW Chew 800 mg by mouth as needed (heart burn).   clopidogrel (PLAVIX) 75 MG tablet Take 1 tablet by mouth once daily   GLUCOSAMINE-CHONDROITIN PO Take 1,000 mg by mouth daily.   metoprolol succinate (TOPROL-XL) 25 MG 24 hr tablet Take 1 tablet by mouth once daily   Multiple Vitamins-Minerals (MULTIVITAMIN WITH MINERALS) tablet Take 0.5 tablets by mouth daily.  nitroGLYCERIN (NITROSTAT) 0.4 MG SL tablet Place 1 tablet (0.4 mg total) under the tongue every 5 (five) minutes x 3 doses as needed for chest pain.   Omega-3 Fatty Acids (FISH OIL PO) Take 1 tablet by mouth daily.   simvastatin (ZOCOR) 40 MG tablet Take 40 mg by mouth daily at 6 PM.    vitamin E 180 MG (400 UNITS) capsule Take 400 Units by mouth daily.     Allergies:   Patient has no known allergies.   Social History   Socioeconomic History   Marital status:  Single    Spouse name: Not on file   Number of children: Not on file   Years of education: Not on file   Highest education level: Not on file  Occupational History   Not on file  Tobacco Use   Smoking status: Former   Smokeless tobacco: Former  Building services engineer Use: Never used  Substance and Sexual Activity   Alcohol use: No   Drug use: No   Sexual activity: Not on file  Other Topics Concern   Not on file  Social History Narrative   Not on file   Social Determinants of Health   Financial Resource Strain: Not on file  Food Insecurity: Not on file  Transportation Needs: Not on file  Physical Activity: Not on file  Stress: Not on file  Social Connections: Not on file     Family History: The patient's family history includes Alzheimer's disease in his mother; CAD in his brother and brother. ROS:   Please see the history of present illness.    All other systems reviewed and are negative.  EKGs/Labs/Other Studies Reviewed:    The following studies were reviewed today:  EKG:  EKG ordered today and personally reviewed.  The ekg ordered today demonstrates sinus rhythm nonspecific T wave otherwise normal EKG  Recent Labs: 12/03/2020: Cholesterol 149 LDL 84 hemoglobin 14.4 creatinine 1.04 potassium 4.3   Physical Exam:    VS:  BP 98/62   Pulse 70   Ht 5\' 10"  (1.778 m)   Wt 134 lb (60.8 kg)   SpO2 97%   BMI 19.23 kg/m     Wt Readings from Last 3 Encounters:  09/30/21 134 lb (60.8 kg)  02/27/20 149 lb (67.6 kg)  03/06/19 151 lb (68.5 kg)     GEN: He looks frail  in no acute distress HEENT: Normal NECK: No JVD; No carotid bruits LYMPHATICS: No lymphadenopathy CARDIAC: RRR, no murmurs, rubs, gallops RESPIRATORY:  Clear to auscultation without rales, wheezing or rhonchi  ABDOMEN: Soft, non-tender, non-distended MUSCULOSKELETAL:  No edema; No deformity  SKIN: Warm and dry NEUROLOGIC:  Alert and oriented x 3 PSYCHIATRIC:  Normal affect    Signed, 05/04/19, MD  09/30/2021 11:58 AM    [facility]

## 2021-09-30 NOTE — Patient Instructions (Addendum)
Medication Instructions:  Your physician has recommended you make the following change in your medication:   START: Nitroglycerin 0.4 mg under the tongue every 5 minutes as needed for chest pain.  *If you need a refill on your cardiac medications before your next appointment, please call your pharmacy*   Lab Work: None If you have labs (blood work) drawn today and your tests are completely normal, you will receive your results only by: MyChart Message (if you have MyChart) OR A paper copy in the mail If you have any lab test that is abnormal or we need to change your treatment, we will call you to review the results.   Testing/Procedures: None   Follow-Up: At [facility], you and your health needs are our priority.  As part of our continuing mission to provide you with exceptional heart care, we have created designated Provider Care Teams.  These Care Teams include your primary Cardiologist (physician) and Advanced Practice Providers (APPs -  Physician Assistants and Nurse Practitioners) who all work together to provide you with the care you need, when you need it.  We recommend signing up for the patient portal called "MyChart".  Sign up information is provided on this After Visit Summary.  MyChart is used to connect with patients for Virtual Visits (Telemedicine).  Patients are able to view lab/test results, encounter notes, upcoming appointments, etc.  Non-urgent messages can be sent to your provider as well.   To learn more about what you can do with MyChart, go to [email or web address].    Your next appointment:   6 month(s)  The format for your next appointment:   In Person  Provider:   Dr. Thomasene Ripple   Other Instructions None  Important Information About Sugar

## 2021-12-21 DIAGNOSIS — Z79899 Other long term (current) drug therapy: Secondary | ICD-10-CM | POA: Diagnosis not present

## 2021-12-21 DIAGNOSIS — E7800 Pure hypercholesterolemia, unspecified: Secondary | ICD-10-CM | POA: Diagnosis not present

## 2021-12-24 DIAGNOSIS — Z0000 Encounter for general adult medical examination without abnormal findings: Secondary | ICD-10-CM | POA: Diagnosis not present

## 2021-12-24 DIAGNOSIS — Z79899 Other long term (current) drug therapy: Secondary | ICD-10-CM | POA: Diagnosis not present

## 2021-12-24 DIAGNOSIS — M1990 Unspecified osteoarthritis, unspecified site: Secondary | ICD-10-CM | POA: Diagnosis not present

## 2021-12-24 DIAGNOSIS — E7800 Pure hypercholesterolemia, unspecified: Secondary | ICD-10-CM | POA: Diagnosis not present

## 2022-08-01 ENCOUNTER — Emergency Department (HOSPITAL_COMMUNITY): Payer: Medicare Other

## 2022-08-01 ENCOUNTER — Inpatient Hospital Stay (HOSPITAL_COMMUNITY)
Admission: EM | Admit: 2022-08-01 | Discharge: 2022-08-06 | DRG: 564 | Disposition: A | Discharge status: Skilled Nursing Facility | Payer: Medicare Other | Attending: Internal Medicine | Admitting: Internal Medicine

## 2022-08-01 DIAGNOSIS — Z87891 Personal history of nicotine dependence: Secondary | ICD-10-CM

## 2022-08-01 DIAGNOSIS — K219 Gastro-esophageal reflux disease without esophagitis: Secondary | ICD-10-CM | POA: Diagnosis present

## 2022-08-01 DIAGNOSIS — W19XXXA Unspecified fall, initial encounter: Secondary | ICD-10-CM | POA: Diagnosis not present

## 2022-08-01 DIAGNOSIS — B957 Other staphylococcus as the cause of diseases classified elsewhere: Secondary | ICD-10-CM | POA: Diagnosis present

## 2022-08-01 DIAGNOSIS — E785 Hyperlipidemia, unspecified: Secondary | ICD-10-CM | POA: Diagnosis not present

## 2022-08-01 DIAGNOSIS — S42001D Fracture of unspecified part of right clavicle, subsequent encounter for fracture with routine healing: Secondary | ICD-10-CM | POA: Diagnosis not present

## 2022-08-01 DIAGNOSIS — H538 Other visual disturbances: Secondary | ICD-10-CM | POA: Diagnosis not present

## 2022-08-01 DIAGNOSIS — S42011A Anterior displaced fracture of sternal end of right clavicle, initial encounter for closed fracture: Secondary | ICD-10-CM | POA: Diagnosis not present

## 2022-08-01 DIAGNOSIS — M6281 Muscle weakness (generalized): Secondary | ICD-10-CM | POA: Diagnosis not present

## 2022-08-01 DIAGNOSIS — E7800 Pure hypercholesterolemia, unspecified: Secondary | ICD-10-CM | POA: Diagnosis present

## 2022-08-01 DIAGNOSIS — I21A1 Myocardial infarction type 2: Secondary | ICD-10-CM | POA: Diagnosis not present

## 2022-08-01 DIAGNOSIS — Z681 Body mass index (BMI) 19 or less, adult: Secondary | ICD-10-CM

## 2022-08-01 DIAGNOSIS — R2689 Other abnormalities of gait and mobility: Secondary | ICD-10-CM | POA: Diagnosis not present

## 2022-08-01 DIAGNOSIS — Z7902 Long term (current) use of antithrombotics/antiplatelets: Secondary | ICD-10-CM

## 2022-08-01 DIAGNOSIS — R1310 Dysphagia, unspecified: Secondary | ICD-10-CM | POA: Diagnosis not present

## 2022-08-01 DIAGNOSIS — F79 Unspecified intellectual disabilities: Secondary | ICD-10-CM | POA: Diagnosis present

## 2022-08-01 DIAGNOSIS — F0390 Unspecified dementia without behavioral disturbance: Secondary | ICD-10-CM | POA: Diagnosis not present

## 2022-08-01 DIAGNOSIS — E876 Hypokalemia: Secondary | ICD-10-CM | POA: Diagnosis present

## 2022-08-01 DIAGNOSIS — I214 Non-ST elevation (NSTEMI) myocardial infarction: Secondary | ICD-10-CM | POA: Diagnosis not present

## 2022-08-01 DIAGNOSIS — I25118 Atherosclerotic heart disease of native coronary artery with other forms of angina pectoris: Secondary | ICD-10-CM | POA: Diagnosis not present

## 2022-08-01 DIAGNOSIS — Z7409 Other reduced mobility: Secondary | ICD-10-CM | POA: Diagnosis not present

## 2022-08-01 DIAGNOSIS — W109XXA Fall (on) (from) unspecified stairs and steps, initial encounter: Secondary | ICD-10-CM | POA: Diagnosis present

## 2022-08-01 DIAGNOSIS — S199XXA Unspecified injury of neck, initial encounter: Secondary | ICD-10-CM | POA: Diagnosis not present

## 2022-08-01 DIAGNOSIS — I700 Atherosclerosis of aorta: Secondary | ICD-10-CM | POA: Diagnosis not present

## 2022-08-01 DIAGNOSIS — I213 ST elevation (STEMI) myocardial infarction of unspecified site: Secondary | ICD-10-CM | POA: Diagnosis not present

## 2022-08-01 DIAGNOSIS — R42 Dizziness and giddiness: Secondary | ICD-10-CM | POA: Diagnosis not present

## 2022-08-01 DIAGNOSIS — G9340 Encephalopathy, unspecified: Secondary | ICD-10-CM

## 2022-08-01 DIAGNOSIS — Z043 Encounter for examination and observation following other accident: Secondary | ICD-10-CM | POA: Diagnosis not present

## 2022-08-01 DIAGNOSIS — I252 Old myocardial infarction: Secondary | ICD-10-CM | POA: Diagnosis not present

## 2022-08-01 DIAGNOSIS — I201 Angina pectoris with documented spasm: Secondary | ICD-10-CM | POA: Diagnosis not present

## 2022-08-01 DIAGNOSIS — Z515 Encounter for palliative care: Secondary | ICD-10-CM | POA: Diagnosis not present

## 2022-08-01 DIAGNOSIS — M1611 Unilateral primary osteoarthritis, right hip: Secondary | ICD-10-CM | POA: Diagnosis not present

## 2022-08-01 DIAGNOSIS — Z23 Encounter for immunization: Secondary | ICD-10-CM

## 2022-08-01 DIAGNOSIS — I2510 Atherosclerotic heart disease of native coronary artery without angina pectoris: Secondary | ICD-10-CM | POA: Diagnosis present

## 2022-08-01 DIAGNOSIS — Z8249 Family history of ischemic heart disease and other diseases of the circulatory system: Secondary | ICD-10-CM

## 2022-08-01 DIAGNOSIS — S0990XA Unspecified injury of head, initial encounter: Secondary | ICD-10-CM | POA: Diagnosis not present

## 2022-08-01 DIAGNOSIS — R1312 Dysphagia, oropharyngeal phase: Secondary | ICD-10-CM | POA: Diagnosis not present

## 2022-08-01 DIAGNOSIS — Z7401 Bed confinement status: Secondary | ICD-10-CM | POA: Diagnosis not present

## 2022-08-01 DIAGNOSIS — I10 Essential (primary) hypertension: Secondary | ICD-10-CM | POA: Diagnosis present

## 2022-08-01 DIAGNOSIS — S42013A Anterior displaced fracture of sternal end of unspecified clavicle, initial encounter for closed fracture: Secondary | ICD-10-CM

## 2022-08-01 DIAGNOSIS — T796XXA Traumatic ischemia of muscle, initial encounter: Principal | ICD-10-CM | POA: Diagnosis present

## 2022-08-01 DIAGNOSIS — G4489 Other headache syndrome: Secondary | ICD-10-CM | POA: Diagnosis not present

## 2022-08-01 DIAGNOSIS — E782 Mixed hyperlipidemia: Secondary | ICD-10-CM | POA: Diagnosis not present

## 2022-08-01 DIAGNOSIS — R41841 Cognitive communication deficit: Secondary | ICD-10-CM | POA: Diagnosis not present

## 2022-08-01 DIAGNOSIS — M6282 Rhabdomyolysis: Secondary | ICD-10-CM | POA: Diagnosis present

## 2022-08-01 DIAGNOSIS — J9811 Atelectasis: Secondary | ICD-10-CM | POA: Diagnosis not present

## 2022-08-01 DIAGNOSIS — F03911 Unspecified dementia, unspecified severity, with agitation: Secondary | ICD-10-CM | POA: Diagnosis present

## 2022-08-01 DIAGNOSIS — Y92009 Unspecified place in unspecified non-institutional (private) residence as the place of occurrence of the external cause: Secondary | ICD-10-CM | POA: Diagnosis not present

## 2022-08-01 DIAGNOSIS — Z79899 Other long term (current) drug therapy: Secondary | ICD-10-CM | POA: Diagnosis not present

## 2022-08-01 DIAGNOSIS — N390 Urinary tract infection, site not specified: Secondary | ICD-10-CM | POA: Diagnosis present

## 2022-08-01 DIAGNOSIS — W108XXA Fall (on) (from) other stairs and steps, initial encounter: Principal | ICD-10-CM

## 2022-08-01 DIAGNOSIS — R58 Hemorrhage, not elsewhere classified: Secondary | ICD-10-CM | POA: Diagnosis not present

## 2022-08-01 DIAGNOSIS — M179 Osteoarthritis of knee, unspecified: Secondary | ICD-10-CM | POA: Diagnosis not present

## 2022-08-01 DIAGNOSIS — I200 Unstable angina: Secondary | ICD-10-CM | POA: Diagnosis not present

## 2022-08-01 HISTORY — DX: Non-ST elevation (NSTEMI) myocardial infarction: I21.4

## 2022-08-01 LAB — PROTIME-INR
INR: 1.2 (ref 0.8–1.2)
Prothrombin Time: 15 seconds (ref 11.4–15.2)

## 2022-08-01 LAB — CBC WITH DIFFERENTIAL/PLATELET
Abs Immature Granulocytes: 0.03 10*3/uL (ref 0.00–0.07)
Basophils Absolute: 0 10*3/uL (ref 0.0–0.1)
Basophils Relative: 0 %
Eosinophils Absolute: 0 10*3/uL (ref 0.0–0.5)
Eosinophils Relative: 0 %
HCT: 37.5 % — ABNORMAL LOW (ref 39.0–52.0)
Hemoglobin: 12.1 g/dL — ABNORMAL LOW (ref 13.0–17.0)
Immature Granulocytes: 0 %
Lymphocytes Relative: 5 %
Lymphs Abs: 0.4 10*3/uL — ABNORMAL LOW (ref 0.7–4.0)
MCH: 31.3 pg (ref 26.0–34.0)
MCHC: 32.3 g/dL (ref 30.0–36.0)
MCV: 96.9 fL (ref 80.0–100.0)
Monocytes Absolute: 0.8 10*3/uL (ref 0.1–1.0)
Monocytes Relative: 8 %
Neutro Abs: 8.4 10*3/uL — ABNORMAL HIGH (ref 1.7–7.7)
Neutrophils Relative %: 87 %
Platelets: 177 10*3/uL (ref 150–400)
RBC: 3.87 MIL/uL — ABNORMAL LOW (ref 4.22–5.81)
RDW: 13.2 % (ref 11.5–15.5)
WBC: 9.7 10*3/uL (ref 4.0–10.5)
nRBC: 0 % (ref 0.0–0.2)

## 2022-08-01 LAB — BLOOD GAS, VENOUS
Acid-Base Excess: 2 mmol/L (ref 0.0–2.0)
Bicarbonate: 27.8 mmol/L (ref 20.0–28.0)
O2 Saturation: 55.7 %
Patient temperature: 37
pCO2, Ven: 47 mmHg (ref 44–60)
pH, Ven: 7.38 (ref 7.25–7.43)
pO2, Ven: 31 mmHg — CL (ref 32–45)

## 2022-08-01 LAB — RAPID URINE DRUG SCREEN, HOSP PERFORMED
Amphetamines: NOT DETECTED
Barbiturates: NOT DETECTED
Benzodiazepines: NOT DETECTED
Cocaine: NOT DETECTED
Opiates: NOT DETECTED
Tetrahydrocannabinol: NOT DETECTED

## 2022-08-01 LAB — URINALYSIS, ROUTINE W REFLEX MICROSCOPIC
Bacteria, UA: NONE SEEN
Bilirubin Urine: NEGATIVE
Glucose, UA: NEGATIVE mg/dL
Ketones, ur: 5 mg/dL — AB
Nitrite: POSITIVE — AB
Protein, ur: 30 mg/dL — AB
Specific Gravity, Urine: 1.025 (ref 1.005–1.030)
pH: 6 (ref 5.0–8.0)

## 2022-08-01 LAB — COMPREHENSIVE METABOLIC PANEL
ALT: 25 U/L (ref 0–44)
AST: 54 U/L — ABNORMAL HIGH (ref 15–41)
Albumin: 4 g/dL (ref 3.5–5.0)
Alkaline Phosphatase: 55 U/L (ref 38–126)
Anion gap: 7 (ref 5–15)
BUN: 27 mg/dL — ABNORMAL HIGH (ref 8–23)
CO2: 25 mmol/L (ref 22–32)
Calcium: 9.2 mg/dL (ref 8.9–10.3)
Chloride: 112 mmol/L — ABNORMAL HIGH (ref 98–111)
Creatinine, Ser: 0.77 mg/dL (ref 0.61–1.24)
GFR, Estimated: >60 mL/min (ref >60)
Glucose, Bld: 104 mg/dL — ABNORMAL HIGH (ref 70–99)
Potassium: 3.7 mmol/L (ref 3.5–5.1)
Sodium: 144 mmol/L (ref 135–145)
Total Bilirubin: 1.6 mg/dL — ABNORMAL HIGH (ref 0.3–1.2)
Total Protein: 6.6 g/dL (ref 6.5–8.1)

## 2022-08-01 LAB — ETHANOL: Alcohol, Ethyl (B): <10 mg/dL (ref <10)

## 2022-08-01 LAB — CK: Total CK: 1888 U/L — ABNORMAL HIGH (ref 49–397)

## 2022-08-01 LAB — TROPONIN I (HIGH SENSITIVITY)
Troponin I (High Sensitivity): 773 ng/L (ref <18)
Troponin I (High Sensitivity): 628 ng/L (ref <18)

## 2022-08-01 LAB — AMMONIA: Ammonia: 13 umol/L (ref 9–35)

## 2022-08-01 MED ORDER — POLYETHYLENE GLYCOL 3350 17 G PO PACK: 17.0000 g | PACK | Freq: Every day | ORAL | Status: DC | PRN

## 2022-08-01 MED ORDER — ENOXAPARIN SODIUM 40 MG/0.4ML IJ SOSY
40.0000 mg | PREFILLED_SYRINGE | INTRAMUSCULAR | Status: DC
  Administered 2022-08-01 – 2022-08-05 (×5): 40 mg via SUBCUTANEOUS
  Filled 2022-08-01 (×5): qty 0.4

## 2022-08-01 MED ORDER — ACETAMINOPHEN 650 MG RE SUPP: 650.0000 mg | Freq: Four times a day (QID) | RECTAL | Status: DC | PRN

## 2022-08-01 MED ORDER — ONDANSETRON HCL 4 MG/2ML IJ SOLN: 4.0000 mg | Freq: Four times a day (QID) | INTRAMUSCULAR | Status: DC | PRN

## 2022-08-01 MED ORDER — SODIUM CHLORIDE 0.9 % IV BOLUS
1000.0000 mL | Freq: Once | INTRAVENOUS | Status: AC
  Administered 2022-08-01: 1000 mL via INTRAVENOUS

## 2022-08-01 MED ORDER — SODIUM CHLORIDE 0.9 % IV SOLN
1.0000 g | INTRAVENOUS | Status: DC
  Administered 2022-08-02 – 2022-08-03 (×2): 1 g via INTRAVENOUS
  Filled 2022-08-01 (×2): qty 10

## 2022-08-01 MED ORDER — IOHEXOL 300 MG/ML  SOLN
100.0000 mL | Freq: Once | INTRAMUSCULAR | Status: AC | PRN
  Administered 2022-08-01: 100 mL via INTRAVENOUS

## 2022-08-01 MED ORDER — ALBUTEROL SULFATE (2.5 MG/3ML) 0.083% IN NEBU: 2.5000 mg | INHALATION_SOLUTION | RESPIRATORY_TRACT | Status: DC | PRN

## 2022-08-01 MED ORDER — TETANUS-DIPHTH-ACELL PERTUSSIS 5-2.5-18.5 LF-MCG/0.5 IM SUSY
0.5000 mL | PREFILLED_SYRINGE | Freq: Once | INTRAMUSCULAR | Status: AC
  Administered 2022-08-01: 0.5 mL via INTRAMUSCULAR
  Filled 2022-08-01: qty 0.5

## 2022-08-01 MED ORDER — MELATONIN 5 MG PO TABS
5.0000 mg | ORAL_TABLET | Freq: Once | ORAL | Status: AC
  Administered 2022-08-01: 5 mg via ORAL
  Filled 2022-08-01: qty 1

## 2022-08-01 MED ORDER — ACETAMINOPHEN 325 MG PO TABS
650.0000 mg | ORAL_TABLET | Freq: Four times a day (QID) | ORAL | Status: DC | PRN
  Administered 2022-08-05 – 2022-08-06 (×2): 650 mg via ORAL
  Filled 2022-08-01 (×2): qty 2

## 2022-08-01 MED ORDER — DOCUSATE SODIUM 100 MG PO CAPS
100.0000 mg | ORAL_CAPSULE | Freq: Two times a day (BID) | ORAL | Status: DC
  Administered 2022-08-01 – 2022-08-06 (×7): 100 mg via ORAL
  Filled 2022-08-01 (×7): qty 1

## 2022-08-01 MED ORDER — METOPROLOL SUCCINATE ER 25 MG PO TB24
25.0000 mg | ORAL_TABLET | Freq: Every day | ORAL | Status: DC
  Administered 2022-08-02: 25 mg via ORAL
  Filled 2022-08-01: qty 1

## 2022-08-01 MED ORDER — CLOPIDOGREL BISULFATE 75 MG PO TABS
75.0000 mg | ORAL_TABLET | Freq: Every day | ORAL | Status: DC
  Administered 2022-08-02 – 2022-08-06 (×3): 75 mg via ORAL
  Filled 2022-08-01 (×3): qty 1

## 2022-08-01 MED ORDER — SODIUM CHLORIDE 0.9 % IV SOLN
1.0000 g | Freq: Once | INTRAVENOUS | Status: AC
  Administered 2022-08-01: 1 g via INTRAVENOUS
  Filled 2022-08-01: qty 10

## 2022-08-01 MED ORDER — ONDANSETRON HCL 4 MG PO TABS: 4.0000 mg | ORAL_TABLET | Freq: Four times a day (QID) | ORAL | Status: DC | PRN

## 2022-08-01 NOTE — ED Triage Notes (Signed)
Pt BIBA with c/o a mechanical fall. Hx of same due to leg weakness. Takes blood thinner. No LOC, did not hit his head. Denies any new pain.   BP 116/70 HR 66 RR 18 99% RA

## 2022-08-01 NOTE — ED Notes (Signed)
Pt has been aware of urine sample needed.

## 2022-08-01 NOTE — ED Provider Notes (Signed)
[facility] EMERGENCY DEPARTMENT AT [facility] Provider Note  CSN: [id number] Arrival date & time: 08/01/22 7829  Chief Complaint(s) Fall  HPI Carlos [name] is a 82 y.o. male with past medical history as below, significant for CAD, HLD, HTN, gait instability, amnesia who presents to the ED with complaint of fall.  Patient is a poor historian, reports that he lives alone, he says that he fell either in his basement or down the stairs to his basement at some point in the evening.  Unable to get up from the ground.  He thinks he spent around 6 hours on the floor but he is unsure.  He has no significant pain, denies LOC no chest pain or dyspnea, abdominal pain, nausea or vomiting.  Does report that he urinated on himself multiple times while he was on the floor the basement.  Says he has a wheelchair but he does not like to use it.  No fevers in the past 24 hours.  Reports he is compliant with his medications.  Level 5 caveat, AMS  Past Medical History Past Medical History:  Diagnosis Date   Amnesia 09/29/2021   Angina pectoris (HCC)    CAD (coronary artery disease) 05/14/2013   GERD (gastroesophageal reflux disease)    Hypercholesteremia    Hyperlipidemia 05/14/2013   Osteoarthritis of knee 09/29/2021   Other long term (current) drug therapy 09/29/2021   Poor balance 09/29/2021   Screening for malignant neoplasm of prostate 09/29/2021   ST elevation myocardial infarction (STEMI) of lateral wall, initial episode of care (HCC) 05/12/2013   Patient Active Problem List   Diagnosis Date Noted   Amnesia 09/29/2021   Osteoarthritis of knee 09/29/2021   Other long term (current) drug therapy 09/29/2021   Poor balance 09/29/2021   Screening for malignant neoplasm of prostate 09/29/2021   Hypercholesteremia    GERD (gastroesophageal reflux disease)    Angina pectoris (HCC)    Hyperlipidemia 05/14/2013   CAD (coronary artery disease) 05/14/2013   ST elevation myocardial infarction (STEMI)  of lateral wall, initial episode of care (HCC) 05/12/2013   Home Medication(s) Prior to Admission medications   Medication Sig Start Date End Date Taking? Authorizing Provider  b complex vitamins tablet Take 0.5 tablets by mouth once a week.    [provider]  Calcium Carbonate Antacid 400 MG CHEW Chew 800 mg by mouth as needed (heart burn).    [provider]  clopidogrel (PLAVIX) 75 MG tablet Take 1 tablet (75 mg total) by mouth daily. 09/30/21   Baldo Daub, MD  GLUCOSAMINE-CHONDROITIN PO Take 1,000 mg by mouth daily.    [provider]  metoprolol succinate (TOPROL-XL) 25 MG 24 hr tablet Take 1 tablet (25 mg total) by mouth daily. 09/30/21   Baldo Daub, MD  Multiple Vitamins-Minerals (MULTIVITAMIN WITH MINERALS) tablet Take 0.5 tablets by mouth daily.     [provider]  nitroGLYCERIN (NITROSTAT) 0.4 MG SL tablet Place 1 tablet (0.4 mg total) under the tongue every 5 (five) minutes x 3 doses as needed for chest pain. 09/30/21   Baldo Daub, MD  Omega-3 Fatty Acids (FISH OIL PO) Take 1 tablet by mouth daily.    [provider]  simvastatin (ZOCOR) 40 MG tablet Take 40 mg by mouth daily at 6 PM.     [provider]  vitamin E 180 MG (400 UNITS) capsule Take 400 Units by mouth daily.    [provider]  Past Surgical History Past Surgical History:  Procedure Laterality Date   ABDOMINAL SURGERY     CARDIAC CATHETERIZATION     CORONARY ANGIOPLASTY     LEFT HEART CATHETERIZATION WITH CORONARY ANGIOGRAM N/A 05/12/2013   Procedure: LEFT HEART CATHETERIZATION WITH CORONARY ANGIOGRAM;  Surgeon: Micheline Chapman, MD;  Location: [facility] CATH LAB;  Service: Cardiovascular;  Laterality: N/A;   PERCUTANEOUS STENT INTERVENTION N/A 05/12/2013   Procedure: PERCUTANEOUS STENT INTERVENTION;  Surgeon: Micheline Chapman, MD;  Location: [facility] CATH LAB;  Service: Cardiovascular;  Laterality: N/A;   Family History Family History  Problem Relation Age of Onset   Alzheimer's disease Mother    CAD Brother    CAD Brother     Social History Social History   Tobacco Use   Smoking status: Former   Smokeless tobacco: Former  Building services engineer Use: Never used  Substance Use Topics   Alcohol use: No   Drug use: No   Allergies Patient has no known allergies.  Review of Systems Review of Systems  Unable to perform ROS: Mental status change  Neurological:  Negative for syncope.    Physical Exam Vital Signs  I have reviewed the triage vital signs BP 120/76   Pulse (!) 58   Temp 97.6 F (36.4 C) (Oral)   Resp 16   SpO2 100%  Physical Exam Vitals and nursing note reviewed.  Constitutional:      General: He is not in acute distress.    Appearance: He is well-developed.     Comments: Malodorous, cachectic, dried stool noted to extremities and urine on clothes  HENT:     Head: Normocephalic and atraumatic.     Right Ear: External ear normal.     Left Ear: External ear normal.     Mouth/Throat:     Mouth: Mucous membranes are moist.  Eyes:     General: No scleral icterus.    Extraocular Movements: Extraocular movements intact.     Pupils: Pupils are equal, round, and reactive to light.  Cardiovascular:     Rate and Rhythm: Normal rate and regular rhythm.     Pulses: Normal pulses.     Heart sounds: Normal heart sounds.  Pulmonary:     Effort: Pulmonary effort is normal. No respiratory distress.     Breath sounds: Normal breath sounds.  Abdominal:     General: Abdomen is flat.     Palpations: Abdomen is soft.     Tenderness: There is no abdominal tenderness. There is no guarding or rebound.  Musculoskeletal:       Arms:     Cervical back: No rigidity.     Right lower leg: No edema.     Left lower leg: No edema.     Comments: Skin tears to lower extremities bilateral. Dried  stool noted on feet  Skin:    General: Skin is warm and dry.     Capillary Refill: Capillary refill takes less than 2 seconds.     Findings: Bruising present.  Neurological:     Mental Status: He is alert. He is disoriented.     GCS: GCS eye subscore is 4. GCS verbal subscore is 4. GCS motor subscore is 6.     Cranial Nerves: Cranial nerves 2-12 are intact.     Sensory: Sensation is intact.     Motor: Motor function is intact.     Coordination: Coordination is intact.  Psychiatric:  Behavior: Behavior is cooperative.     ED Results and Treatments Labs (all labs ordered are listed, but only abnormal results are displayed) Labs Reviewed  CBC WITH DIFFERENTIAL/PLATELET - Abnormal; Notable for the following components:      Result Value   RBC 3.87 (*)    Hemoglobin 12.1 (*)    HCT 37.5 (*)    Neutro Abs 8.4 (*)    Lymphs Abs 0.4 (*)    All other components within normal limits  COMPREHENSIVE METABOLIC PANEL - Abnormal; Notable for the following components:   Chloride 112 (*)    Glucose, Bld 104 (*)    BUN 27 (*)    AST 54 (*)    Total Bilirubin 1.6 (*)    All other components within normal limits  URINALYSIS, ROUTINE W REFLEX MICROSCOPIC - Abnormal; Notable for the following components:   Hgb urine dipstick LARGE (*)    Ketones, ur 5 (*)    Protein, ur 30 (*)    Nitrite POSITIVE (*)    Leukocytes,Ua SMALL (*)    All other components within normal limits  BLOOD GAS, VENOUS - Abnormal; Notable for the following components:   pO2, Ven 31 (*)    All other components within normal limits  CK - Abnormal; Notable for the following components:   Total CK 1,888 (*)    All other components within normal limits  TROPONIN I (HIGH SENSITIVITY) - Abnormal; Notable for the following components:   Troponin I (High Sensitivity) 773 (*)    All other components within normal limits  TROPONIN I (HIGH SENSITIVITY) - Abnormal; Notable for the following components:   Troponin I (High  Sensitivity) 628 (*)    All other components within normal limits  URINE CULTURE  PROTIME-INR  AMMONIA  ETHANOL  RAPID URINE DRUG SCREEN, HOSP PERFORMED                                                                                                                          Radiology CT L-SPINE NO CHARGE  Result Date: 08/01/2022 CLINICAL DATA:  Fall, poly trauma EXAM: CT LUMBAR SPINE WITHOUT CONTRAST TECHNIQUE: Multidetector CT imaging of the lumbar spine was performed without intravenous contrast administration. Multiplanar CT image reconstructions were also generated. RADIATION DOSE REDUCTION: This exam was performed according to the departmental dose-optimization program which includes automated exposure control, adjustment of the mA and/or kV according to patient size and/or use of iterative reconstruction technique. COMPARISON:  CT abdomen 08/01/2022 FINDINGS: Segmentation: The lowest lumbar type non-rib-bearing vertebra is labeled as L5. Alignment: No vertebral subluxation is observed. Vertebrae: Prominent loss of intervertebral disc height at L4-5 with mild endplate irregularity and vacuum disc phenomenon. Schmorl's nodes at various levels, most notably along the superior and inferior endplates at L2. No lumbar spine fracture or acute subluxation. Paraspinal and other soft tissues: Please see dedicated CT abdomen report. Disc levels: T12-L1: Unremarkable L1-2: No impingement.  Mild disc bulge. L2-3: Mild displacement of  the right L2 nerve in the lateral extraforaminal space due to disc bulge. L3-4: Mild right and borderline left foraminal stenosis due to disc bulge L4-5: Mild left and borderline right foraminal stenosis due to disc bulge and intervertebral and facet spurring. L5-S1: Moderate right and borderline left foraminal stenosis due to disc bulge and facet arthropathy. IMPRESSION: 1. No acute lumbar spine findings. 2. Lumbar spondylosis and degenerative disc disease, causing moderate  impingement at L5-S1, and mild impingement at L2-3, L3-4, and L4-5. Electronically Signed   By: Gaylyn Rong M.D.   On: 08/01/2022 14:15   CT CHEST ABDOMEN PELVIS W CONTRAST  Result Date: 08/01/2022 CLINICAL DATA:  Fall.  Anti coagulation.  Poly trauma. EXAM: CT CHEST, ABDOMEN, AND PELVIS WITH CONTRAST TECHNIQUE: Multidetector CT imaging of the chest, abdomen and pelvis was performed following the standard protocol during bolus administration of intravenous contrast. RADIATION DOSE REDUCTION: This exam was performed according to the departmental dose-optimization program which includes automated exposure control, adjustment of the mA and/or kV according to patient size and/or use of iterative reconstruction technique. CONTRAST:  OMNIPAQUE IOHEXOL 300 MG/ML  SOLN COMPARISON:  Chest radiograph 08/01/2022 and pelvis radiograph 08/01/2022; report from CT abdomen/pelvis 05/30/2001 FINDINGS: CT CHEST FINDINGS Cardiovascular: Coronary, aortic arch, and branch vessel atherosclerotic vascular disease. Mediastinum/Nodes: Unremarkable Lungs/Pleura: Minimal airway plugging anteriorly in the right upper lobe. Mild dependent atelectasis the posterior basal segments of both lower lobes. Musculoskeletal: Fracture the medial right clavicle immediately adjacent to the sternoclavicular joint. Thoracic spondylosis. CT ABDOMEN PELVIS FINDINGS Hepatobiliary: Unremarkable Pancreas: Unremarkable Spleen: Unremarkable Adrenals/Urinary Tract: Both adrenal glands appear normal. Left pelvic kidney noted with some suspected scarring 1. Stomach/Bowel: Air-level in a left abdominal small bowel loop on image 78 series 4 without small bowel dilatation. Prominent stool throughout the colon favors constipation. Vascular/Lymphatic: Atheromatous plaque dorsally at the origin of the celiac trunk without occlusion. Atherosclerosis is present, including aortoiliac atherosclerotic disease. No pathologic adenopathy. Reproductive: Dystrophic  calcification posteriorly along the transition zone bilaterally. Other: No supplemental non-categorized findings. Musculoskeletal: Degenerative arthropathy of the hips, right greater than left. There is 5 mm of step-off in the upper coccyx on image 81 series 12 suspicious for current or prior fracture. Lumbar spondylosis and degenerative disc disease. Please see dedicated lumbar spine CT report. IMPRESSION: 1. Acute fracture of the medial right clavicle immediately adjacent to the sternoclavicular joint. 2. 5 mm of step-off in the upper coccyx suspicious for current or prior fracture. Correlate with any point tenderness in this vicinity. 3. Prominent stool throughout the colon favors constipation. 4. Left pelvic kidney with some suspected scarring. 5. Lumbar spondylosis and degenerative disc disease. 6. Coronary, aortic arch, and branch vessel atherosclerotic vascular disease. 7. Minimal airway plugging anteriorly in the right upper lobe. Mild dependent atelectasis in the posterior basal segments of both lower lobes. 8. Degenerative arthropathy of the hips, right greater than left. 9. Aortic and coronary atherosclerosis. Aortic Atherosclerosis (ICD10-I70.0). Electronically Signed   By: Gaylyn Rong M.D.   On: 08/01/2022 14:11   DG Chest Portable 1 View  Result Date: 08/01/2022 CLINICAL DATA:  Fall EXAM: PORTABLE CHEST 1 VIEW COMPARISON:  05/12/2013 chest radiograph. FINDINGS: Stable cardiomediastinal silhouette with normal heart size. No pneumothorax. Multiple skin folds overlie the right lung. No pleural effusion. Lungs appear clear, with no acute consolidative airspace disease and no pulmonary edema. No displaced fractures in the visualized chest. IMPRESSION: No active disease. Electronically Signed   By: Delbert Phenix M.D.   On: 08/01/2022 12:02  DG Pelvis 1-2 Views  Result Date: 08/01/2022 CLINICAL DATA:  Fall EXAM: PELVIS - 1-2 VIEW COMPARISON:  None Available. FINDINGS: No pelvic fracture or  diastasis. No evidence of hip dislocation on this single frontal view. Moderate lower lumbar degenerative changes. Moderate to severe right hip osteoarthritis and mild left hip osteoarthritis. No suspicious focal osseous lesions. IMPRESSION: No pelvic fracture. Moderate to severe right hip osteoarthritis and mild left hip osteoarthritis. Electronically Signed   By: Delbert Phenix M.D.   On: 08/01/2022 12:01   CT Head Wo Contrast  Result Date: 08/01/2022 CLINICAL DATA:  Minor head trauma. EXAM: CT HEAD WITHOUT CONTRAST CT CERVICAL SPINE WITHOUT CONTRAST TECHNIQUE: Multidetector CT imaging of the head and cervical spine was performed following the standard protocol without intravenous contrast. Multiplanar CT image reconstructions of the cervical spine were also generated. RADIATION DOSE REDUCTION: This exam was performed according to the departmental dose-optimization program which includes automated exposure control, adjustment of the mA and/or kV according to patient size and/or use of iterative reconstruction technique. COMPARISON:  None Available. FINDINGS: CT HEAD FINDINGS Brain: No evidence of acute infarction, hemorrhage, hydrocephalus, extra-axial collection or mass lesion/mass effect. Vascular: No hyperdense vessel or unexpected calcification. Skull: Normal. Negative for fracture or focal lesion. Sinuses/Orbits: No acute finding. CT CERVICAL SPINE FINDINGS Alignment: Normal. Skull base and vertebrae: No acute fracture. No primary bone lesion or focal pathologic process. Soft tissues and spinal canal: No prevertebral fluid or swelling. No visible canal hematoma. Disc levels:  Generalized disc space narrowing and ridging. Upper chest: No evidence of injury IMPRESSION: No evidence of acute intracranial or cervical spine injury. Electronically Signed   By: Tiburcio Pea M.D.   On: 08/01/2022 11:31   CT Cervical Spine Wo Contrast  Result Date: 08/01/2022 CLINICAL DATA:  Minor head trauma. EXAM: CT HEAD  WITHOUT CONTRAST CT CERVICAL SPINE WITHOUT CONTRAST TECHNIQUE: Multidetector CT imaging of the head and cervical spine was performed following the standard protocol without intravenous contrast. Multiplanar CT image reconstructions of the cervical spine were also generated. RADIATION DOSE REDUCTION: This exam was performed according to the departmental dose-optimization program which includes automated exposure control, adjustment of the mA and/or kV according to patient size and/or use of iterative reconstruction technique. COMPARISON:  None Available. FINDINGS: CT HEAD FINDINGS Brain: No evidence of acute infarction, hemorrhage, hydrocephalus, extra-axial collection or mass lesion/mass effect. Vascular: No hyperdense vessel or unexpected calcification. Skull: Normal. Negative for fracture or focal lesion. Sinuses/Orbits: No acute finding. CT CERVICAL SPINE FINDINGS Alignment: Normal. Skull base and vertebrae: No acute fracture. No primary bone lesion or focal pathologic process. Soft tissues and spinal canal: No prevertebral fluid or swelling. No visible canal hematoma. Disc levels:  Generalized disc space narrowing and ridging. Upper chest: No evidence of injury IMPRESSION: No evidence of acute intracranial or cervical spine injury. Electronically Signed   By: Tiburcio Pea M.D.   On: 08/01/2022 11:31    Pertinent labs & imaging results that were available during my care of the patient were reviewed by me and considered in my medical decision making (see MDM for details).  Medications Ordered in ED Medications  cefTRIAXone (ROCEPHIN) 1 g in sodium chloride 0.9 % 100 mL IVPB (1 g Intravenous New Bag/Given 08/01/22 1453)  sodium chloride 0.9 % bolus 1,000 mL (0 mLs Intravenous Stopped 08/01/22 1354)  iohexol (OMNIPAQUE) 300 MG/ML solution 100 mL (100 mLs Intravenous Contrast Given 08/01/22 1316)  Tdap (BOOSTRIX) injection 0.5 mL (0.5 mLs Intramuscular Given 08/01/22 1450)  Procedures .Critical Care  Performed by: Sloan Leiter, DO Authorized by: Sloan Leiter, DO   Critical care provider statement:    Critical care time (minutes):  32   Critical care time was exclusive of:  Separately billable procedures and treating other patients   Critical care was necessary to treat or prevent imminent or life-threatening deterioration of the following conditions:  Trauma   Critical care was time spent personally by me on the following activities:  Development of treatment plan with patient or surrogate, discussions with consultants, evaluation of patient's response to treatment, examination of patient, ordering and review of laboratory studies, ordering and review of radiographic studies, ordering and performing treatments and interventions, pulse oximetry, re-evaluation of patient's condition, review of old charts and obtaining history from patient or surrogate   Care discussed with: admitting provider     (including critical care time)  Medical Decision Making / ED Course    Medical Decision Making:    Carlos [name] is a 82 y.o. male hx CAD, PCI 2015, HTN, HLD . The complaint involves an extensive differential diagnosis and also carries with it a high risk of complications and morbidity.  Serious etiology was considered. Ddx includes but is not limited to: Differential diagnoses for altered mental status includes but is not exclusive to alcohol, illicit or prescription medications, intracranial pathology such as stroke, intracerebral hemorrhage, fever or infectious causes including sepsis, hypoxemia, uremia, trauma, endocrine related disorders such as diabetes, hypoglycemia, thyroid-related diseases, etc.   Complete initial physical exam performed, notably the patient  was HDS, he is a poor historian, he is not sure why he is here, variable complaints..    Reviewed and  confirmed nursing documentation for past medical history, family history, social history.  Vital signs reviewed.    Clinical Course as of 08/01/22 1504  Sun Aug 01, 2022  1023 Tayvien Kane [id number]  >> pt contact, called but no answer, vm not set up. I asked pt about who Onalee Hua was and he responded "you got me there, I don't know." [SG]  1137 Trop ++, vague chest pain. EKG changes noted, ?RBBB vs junctional. Spoke with Dr Eldridge Dace via telephone who has reviewed EKG, favor likely demand ischemia, hold off heparin.  [SG]  1139 Creatinine: 0.77 [SG]  1139 CK Total(!): 1,888 [SG]  1238 Leilani Able [phone] called, reporting that she helps to take care of him. She reports that he fell down last night, fell down flight of steps and landed on the concrete pad at the base of the steps. [name] (sister of Bonita Quin) lives with him, she was able to get him into bed. They tried to get him out of bed this morning and he couldn't stand up. Bonita Quin reports that [name] was went outside mowing the yard yesterday starting sometime in the morning, she went to a party after at [employer] and when she got home around 4pm she found him at the base of the steps and had to drag him back to his bedroom. Intermittent behavior changes last few months, worsening, thinks that he might be developing dementia. Bonita Quin reports that [name] has lived with him for >20 years, she is mentally disabled. Bonita Quin reports she has been trying to convince them to come live with them but they are resistant.  [SG]  1434 Medial clavicle fx on imaging c/w exam; closed; f/u in office per Dr Jena Gauss [SG]    Clinical Course User Index [SG] Tanda Rockers A, DO    Pt  with fall down stairs, demand ischemia likely a/w the fall, rhabdymyolysis likely 2/2 fall, medial clavicle fx that is closed. Skin tears to LE b/l UA abnormal but no bacteria, will send culture/rocephin ordered Family's concern for worsening memory issues at home, worsening ability to  care for himself.  Bonita Quin has been attempting to get the patient and his significant other to come live with them but they have been resistant. Pt to require admission, likely would benefit from SNF at time of discharge Admitted [facility]         Additional history obtained: -Additional history obtained from family -External records from outside source obtained and reviewed including: Chart review including previous notes, labs, imaging, consultation notes including prior admission, prior cardiology evaluation, home medications, prior labs and imaging   Lab Tests: -I ordered, reviewed, and interpreted labs.   The pertinent results include:   Labs Reviewed  CBC WITH DIFFERENTIAL/PLATELET - Abnormal; Notable for the following components:      Result Value   RBC 3.87 (*)    Hemoglobin 12.1 (*)    HCT 37.5 (*)    Neutro Abs 8.4 (*)    Lymphs Abs 0.4 (*)    All other components within normal limits  COMPREHENSIVE METABOLIC PANEL - Abnormal; Notable for the following components:   Chloride 112 (*)    Glucose, Bld 104 (*)    BUN 27 (*)    AST 54 (*)    Total Bilirubin 1.6 (*)    All other components within normal limits  URINALYSIS, ROUTINE W REFLEX MICROSCOPIC - Abnormal; Notable for the following components:   Hgb urine dipstick LARGE (*)    Ketones, ur 5 (*)    Protein, ur 30 (*)    Nitrite POSITIVE (*)    Leukocytes,Ua SMALL (*)    All other components within normal limits  BLOOD GAS, VENOUS - Abnormal; Notable for the following components:   pO2, Ven 31 (*)    All other components within normal limits  CK - Abnormal; Notable for the following components:   Total CK 1,888 (*)    All other components within normal limits  TROPONIN I (HIGH SENSITIVITY) - Abnormal; Notable for the following components:   Troponin I (High Sensitivity) 773 (*)    All other components within normal limits  TROPONIN I (HIGH SENSITIVITY) - Abnormal; Notable for the following components:   Troponin I  (High Sensitivity) 628 (*)    All other components within normal limits  URINE CULTURE  PROTIME-INR  AMMONIA  ETHANOL  RAPID URINE DRUG SCREEN, HOSP PERFORMED    Notable for CPK +, Trop +  EKG   EKG Interpretation  Date/Time:  Sunday August 01 2022 11:25:23 EDT Ventricular Rate:  61 PR Interval:  169 QRS Duration: 197 QT Interval:  543 QTC Calculation: 548 R Axis:   174 Text Interpretation: Junctional rhythm Right bundle branch block st depression  noted slight elevation noted Confirmed by Tanda Rockers ([id number]) on 08/01/2022 11:29:50 AM         Imaging Studies ordered: I ordered imaging studies including CTH/ ct c/s, ct chest abd pelvis, xr chest/xr pelvis I independently visualized the following imaging with scope of interpretation limited to determining acute life threatening conditions related to emergency care; findings noted above, significant for clavicle fracture, ?coccyx fx I independently visualized and interpreted imaging. I agree with the radiologist interpretation   Medicines ordered and prescription drug management: Meds ordered this encounter  Medications   sodium chloride 0.9 %  bolus 1,000 mL   iohexol (OMNIPAQUE) 300 MG/ML solution 100 mL   Tdap (BOOSTRIX) injection 0.5 mL   cefTRIAXone (ROCEPHIN) 1 g in sodium chloride 0.9 % 100 mL IVPB    Order Specific Question:   Antibiotic Indication:    Answer:   UTI    -I have reviewed the patients home medicines and have made adjustments as needed   Consultations Obtained: I requested consultation with the Dr Jena Gauss, Dr Eldridge Dace,  and discussed lab and imaging findings as well as pertinent plan    Cardiac Monitoring: The patient was maintained on a cardiac monitor.  I personally viewed and interpreted the cardiac monitored which showed an underlying rhythm of: sinus brady  Social Determinants of Health:  Diagnosis or treatment significantly limited by social determinants of health: former  smoker   Reevaluation: After the interventions noted above, I reevaluated the patient and found that they have improved  Co morbidities that complicate the patient evaluation  Past Medical History:  Diagnosis Date   Amnesia 09/29/2021   Angina pectoris (HCC)    CAD (coronary artery disease) 05/14/2013   GERD (gastroesophageal reflux disease)    Hypercholesteremia    Hyperlipidemia 05/14/2013   Osteoarthritis of knee 09/29/2021   Other long term (current) drug therapy 09/29/2021   Poor balance 09/29/2021   Screening for malignant neoplasm of prostate 09/29/2021   ST elevation myocardial infarction (STEMI) of lateral wall, initial episode of care [facility]) 05/12/2013      Dispostion: Disposition decision including need for hospitalization was considered, and patient admitted to the hospital.    Final Clinical Impression(s) / ED Diagnoses Final diagnoses:  Encephalopathy  Medial epiphyseal fracture of clavicle  Traumatic rhabdomyolysis, initial encounter [facility])  Fall down stairs, initial encounter     This chart was dictated using voice recognition software.  Despite best efforts to proofread,  errors can occur which can change the documentation meaning.    Sloan Leiter, DO 08/01/22 1505

## 2022-08-01 NOTE — ED Notes (Signed)
Called down to main lab, kong made it aware that the will using Jestin urine sample for the new culture order.

## 2022-08-01 NOTE — ED Notes (Addendum)
Pt urine was collected. When pt was finishing urinating, blood started coming out the tip of his penis.

## 2022-08-01 NOTE — H&P (Signed)
History and Physical  Carlos [name] WGN:[id number] DOB: 1940-03-25 DOA: 08/01/2022  PCP: Daisy Floro, MD   Chief Complaint: fall down stairs   HPI: Carlos [name] is a 82 y.o. male with medical history significant for stable CAD, hypertension, hyperlipidemia being admitted to the hospital after being found down at home, with NSTEMI, rhabdomyolysis and right clavicular fracture.  History provided somewhat by the patient, but mainly by a friend of his Bonita Quin.  Apparently [name]'s sister Carlos [name] has lived with the patient for the last couple of decades.  Carlos [name] has been having some memory issues of late, and Carlos [name] has some developmental delay and the sort of take care of 1 another.  Bonita Quin has been trying to convince Carlos [name] and Carlos [name] to come live with her.  In any case, it seems Carlos [name] left home yesterday morning, did some work in the yard and then was gone at some sort of a Network engineer.  When she came back home around 4 PM, she found Carlos [name] at the base of the basement steps.  He had wet himself, she got him back in bed.  The patient is able to tell me that he fell down the stairs, and that it was very painful.  He does not think he passed out, thinks that he just slipped.  Denies any chest pain, cough, recent illness.  Currently he says he is comfortable.  In any case, EMS was called this morning when Carlos [name] was too weak to get out of bed.  ED Course: Extensive evaluation in the ER revealed normal vital signs.  It was disheveled smelling of urine, and with dried feces on his legs.  Multiple skin tears.  Lab work revealed relatively unremarkable CBC and CMP, CK total was 1888, initial troponin 773, next troponin several hours later 628.  EKG reviewed, without any acute changes.  Patient included CT of the head, CT of the chest abdomen pelvis, cervical spine CT, chest x-ray, pelvic x-ray.  These revealed a proximal right clavicular head nondisplaced fracture, and an old coccyx fracture.  ER provider  discussed with orthopedic surgery Dr. Jena Gauss who recommends outpatient follow-up regarding the collicular fracture.  He also discussed with cardiology Dr. Eldridge Dace who reviewed EKG, feels that this is likely a type II ACS from his injury and being on the ground.  Review of Systems: Please see HPI for pertinent positives and negatives. A complete 10 system review of systems are otherwise negative.  Past Medical History:  Diagnosis Date   Amnesia 09/29/2021   Angina pectoris (HCC)    CAD (coronary artery disease) 05/14/2013   GERD (gastroesophageal reflux disease)    Hypercholesteremia    Hyperlipidemia 05/14/2013   Osteoarthritis of knee 09/29/2021   Other long term (current) drug therapy 09/29/2021   Poor balance 09/29/2021   Screening for malignant neoplasm of prostate 09/29/2021   ST elevation myocardial infarction (STEMI) of lateral wall, initial episode of care [facility]) 05/12/2013   Past Surgical History:  Procedure Laterality Date   ABDOMINAL SURGERY     CARDIAC CATHETERIZATION     CORONARY ANGIOPLASTY     LEFT HEART CATHETERIZATION WITH CORONARY ANGIOGRAM N/A 05/12/2013   Procedure: LEFT HEART CATHETERIZATION WITH CORONARY ANGIOGRAM;  Surgeon: Micheline Chapman, MD;  Location: [facility] CATH LAB;  Service: Cardiovascular;  Laterality: N/A;   PERCUTANEOUS STENT INTERVENTION N/A 05/12/2013   Procedure: PERCUTANEOUS STENT INTERVENTION;  Surgeon: Micheline Chapman, MD;  Location: [facility] CATH LAB;  Service: Cardiovascular;  Laterality: N/A;  Social History:  reports that he has quit smoking. He has quit using smokeless tobacco. He reports that he does not drink alcohol and does not use drugs.   No Known Allergies  Family History  Problem Relation Age of Onset   Alzheimer's disease Mother    CAD Brother    CAD Brother      Prior to Admission medications   Medication Sig Start Date End Date Taking? Authorizing Provider  b complex vitamins tablet Take 0.5 tablets by mouth once a week.    [provider]  Calcium Carbonate Antacid 400 MG CHEW Chew 800 mg by mouth as needed (heart burn).    [provider]  clopidogrel (PLAVIX) 75 MG tablet Take 1 tablet (75 mg total) by mouth daily. 09/30/21   Baldo Daub, MD  GLUCOSAMINE-CHONDROITIN PO Take 1,000 mg by mouth daily.    [provider]  metoprolol succinate (TOPROL-XL) 25 MG 24 hr tablet Take 1 tablet (25 mg total) by mouth daily. 09/30/21   Baldo Daub, MD  Multiple Vitamins-Minerals (MULTIVITAMIN WITH MINERALS) tablet Take 0.5 tablets by mouth daily.     [provider]  nitroGLYCERIN (NITROSTAT) 0.4 MG SL tablet Place 1 tablet (0.4 mg total) under the tongue every 5 (five) minutes x 3 doses as needed for chest pain. 09/30/21   Baldo Daub, MD  Omega-3 Fatty Acids (FISH OIL PO) Take 1 tablet by mouth daily.    [provider]  simvastatin (ZOCOR) 40 MG tablet Take 40 mg by mouth daily at 6 PM.     [provider]  vitamin E 180 MG (400 UNITS) capsule Take 400 Units by mouth daily.    [provider]    Physical Exam: BP 120/76   Pulse (!) 58   Temp 97.6 F (36.4 C) (Oral)   Resp 16   SpO2 100%   General: Patient is resting comfortably in bed watching baseball, but awake and alert, oriented only to self.  Overall he is thin, disheveled in appearance. Eyes: EOMI, clear conjuctivae, white sclerea Neck: supple, no masses, trachea mildline  Cardiovascular: RRR, no murmurs or rubs, no peripheral edema  Respiratory: clear to auscultation bilaterally, no wheezes, no crackles  Abdomen: soft, nontender, nondistended, normal bowel tones heard  Skin: dry, multiple skin tears on his bilateral lower extremities Musculoskeletal: no joint effusions, normal range of motion  Psychiatric: appropriate affect, normal speech  Neurologic: extraocular muscles intact, clear speech, moving all extremities with intact sensorium          Labs on Admission:  Basic Metabolic  Panel: Recent Labs  Lab 08/01/22 1026  NA 144  K 3.7  CL 112*  CO2 25  GLUCOSE 104*  BUN 27*  CREATININE 0.77  CALCIUM 9.2   Liver Function Tests: Recent Labs  Lab 08/01/22 1026  AST 54*  ALT 25  ALKPHOS 55  BILITOT 1.6*  PROT 6.6  ALBUMIN 4.0   No results for input(s): "LIPASE", "AMYLASE" in the last 168 hours. Recent Labs  Lab 08/01/22 1026  AMMONIA 13   CBC: Recent Labs  Lab 08/01/22 1026  WBC 9.7  NEUTROABS 8.4*  HGB 12.1*  HCT 37.5*  MCV 96.9  PLT 177   Cardiac Enzymes: Recent Labs  Lab 08/01/22 1026  CKTOTAL 1,888*    BNP (last 3 results) No results for input(s): "BNP" in the last 8760 hours.  ProBNP (last 3 results) No results for input(s): "PROBNP" in the last 8760  hours.  CBG: No results for input(s): "GLUCAP" in the last 168 hours.  Radiological Exams on Admission: CT L-SPINE NO CHARGE  Result Date: 08/01/2022 CLINICAL DATA:  Fall, poly trauma EXAM: CT LUMBAR SPINE WITHOUT CONTRAST TECHNIQUE: Multidetector CT imaging of the lumbar spine was performed without intravenous contrast administration. Multiplanar CT image reconstructions were also generated. RADIATION DOSE REDUCTION: This exam was performed according to the departmental dose-optimization program which includes automated exposure control, adjustment of the mA and/or kV according to patient size and/or use of iterative reconstruction technique. COMPARISON:  CT abdomen 08/01/2022 FINDINGS: Segmentation: The lowest lumbar type non-rib-bearing vertebra is labeled as L5. Alignment: No vertebral subluxation is observed. Vertebrae: Prominent loss of intervertebral disc height at L4-5 with mild endplate irregularity and vacuum disc phenomenon. Schmorl's nodes at various levels, most notably along the superior and inferior endplates at L2. No lumbar spine fracture or acute subluxation. Paraspinal and other soft tissues: Please see dedicated CT abdomen report. Disc levels: T12-L1: Unremarkable L1-2:  No impingement.  Mild disc bulge. L2-3: Mild displacement of the right L2 nerve in the lateral extraforaminal space due to disc bulge. L3-4: Mild right and borderline left foraminal stenosis due to disc bulge L4-5: Mild left and borderline right foraminal stenosis due to disc bulge and intervertebral and facet spurring. L5-S1: Moderate right and borderline left foraminal stenosis due to disc bulge and facet arthropathy. IMPRESSION: 1. No acute lumbar spine findings. 2. Lumbar spondylosis and degenerative disc disease, causing moderate impingement at L5-S1, and mild impingement at L2-3, L3-4, and L4-5. Electronically Signed   By: Gaylyn Rong M.D.   On: 08/01/2022 14:15   CT CHEST ABDOMEN PELVIS W CONTRAST  Result Date: 08/01/2022 CLINICAL DATA:  Fall.  Anti coagulation.  Poly trauma. EXAM: CT CHEST, ABDOMEN, AND PELVIS WITH CONTRAST TECHNIQUE: Multidetector CT imaging of the chest, abdomen and pelvis was performed following the standard protocol during bolus administration of intravenous contrast. RADIATION DOSE REDUCTION: This exam was performed according to the departmental dose-optimization program which includes automated exposure control, adjustment of the mA and/or kV according to patient size and/or use of iterative reconstruction technique. CONTRAST:  OMNIPAQUE IOHEXOL 300 MG/ML  SOLN COMPARISON:  Chest radiograph 08/01/2022 and pelvis radiograph 08/01/2022; report from CT abdomen/pelvis 05/30/2001 FINDINGS: CT CHEST FINDINGS Cardiovascular: Coronary, aortic arch, and branch vessel atherosclerotic vascular disease. Mediastinum/Nodes: Unremarkable Lungs/Pleura: Minimal airway plugging anteriorly in the right upper lobe. Mild dependent atelectasis the posterior basal segments of both lower lobes. Musculoskeletal: Fracture the medial right clavicle immediately adjacent to the sternoclavicular joint. Thoracic spondylosis. CT ABDOMEN PELVIS FINDINGS Hepatobiliary: Unremarkable Pancreas:  Unremarkable Spleen: Unremarkable Adrenals/Urinary Tract: Both adrenal glands appear normal. Left pelvic kidney noted with some suspected scarring 1. Stomach/Bowel: Air-level in a left abdominal small bowel loop on image 78 series 4 without small bowel dilatation. Prominent stool throughout the colon favors constipation. Vascular/Lymphatic: Atheromatous plaque dorsally at the origin of the celiac trunk without occlusion. Atherosclerosis is present, including aortoiliac atherosclerotic disease. No pathologic adenopathy. Reproductive: Dystrophic calcification posteriorly along the transition zone bilaterally. Other: No supplemental non-categorized findings. Musculoskeletal: Degenerative arthropathy of the hips, right greater than left. There is 5 mm of step-off in the upper coccyx on image 81 series 12 suspicious for current or prior fracture. Lumbar spondylosis and degenerative disc disease. Please see dedicated lumbar spine CT report. IMPRESSION: 1. Acute fracture of the medial right clavicle immediately adjacent to the sternoclavicular joint. 2. 5 mm of step-off in the upper coccyx suspicious for current  or prior fracture. Correlate with any point tenderness in this vicinity. 3. Prominent stool throughout the colon favors constipation. 4. Left pelvic kidney with some suspected scarring. 5. Lumbar spondylosis and degenerative disc disease. 6. Coronary, aortic arch, and branch vessel atherosclerotic vascular disease. 7. Minimal airway plugging anteriorly in the right upper lobe. Mild dependent atelectasis in the posterior basal segments of both lower lobes. 8. Degenerative arthropathy of the hips, right greater than left. 9. Aortic and coronary atherosclerosis. Aortic Atherosclerosis (ICD10-I70.0). Electronically Signed   By: Gaylyn Rong M.D.   On: 08/01/2022 14:11   DG Chest Portable 1 View  Result Date: 08/01/2022 CLINICAL DATA:  Fall EXAM: PORTABLE CHEST 1 VIEW COMPARISON:  05/12/2013 chest radiograph.  FINDINGS: Stable cardiomediastinal silhouette with normal heart size. No pneumothorax. Multiple skin folds overlie the right lung. No pleural effusion. Lungs appear clear, with no acute consolidative airspace disease and no pulmonary edema. No displaced fractures in the visualized chest. IMPRESSION: No active disease. Electronically Signed   By: Delbert Phenix M.D.   On: 08/01/2022 12:02   DG Pelvis 1-2 Views  Result Date: 08/01/2022 CLINICAL DATA:  Fall EXAM: PELVIS - 1-2 VIEW COMPARISON:  None Available. FINDINGS: No pelvic fracture or diastasis. No evidence of hip dislocation on this single frontal view. Moderate lower lumbar degenerative changes. Moderate to severe right hip osteoarthritis and mild left hip osteoarthritis. No suspicious focal osseous lesions. IMPRESSION: No pelvic fracture. Moderate to severe right hip osteoarthritis and mild left hip osteoarthritis. Electronically Signed   By: Delbert Phenix M.D.   On: 08/01/2022 12:01   CT Head Wo Contrast  Result Date: 08/01/2022 CLINICAL DATA:  Minor head trauma. EXAM: CT HEAD WITHOUT CONTRAST CT CERVICAL SPINE WITHOUT CONTRAST TECHNIQUE: Multidetector CT imaging of the head and cervical spine was performed following the standard protocol without intravenous contrast. Multiplanar CT image reconstructions of the cervical spine were also generated. RADIATION DOSE REDUCTION: This exam was performed according to the departmental dose-optimization program which includes automated exposure control, adjustment of the mA and/or kV according to patient size and/or use of iterative reconstruction technique. COMPARISON:  None Available. FINDINGS: CT HEAD FINDINGS Brain: No evidence of acute infarction, hemorrhage, hydrocephalus, extra-axial collection or mass lesion/mass effect. Vascular: No hyperdense vessel or unexpected calcification. Skull: Normal. Negative for fracture or focal lesion. Sinuses/Orbits: No acute finding. CT CERVICAL SPINE FINDINGS Alignment:  Normal. Skull base and vertebrae: No acute fracture. No primary bone lesion or focal pathologic process. Soft tissues and spinal canal: No prevertebral fluid or swelling. No visible canal hematoma. Disc levels:  Generalized disc space narrowing and ridging. Upper chest: No evidence of injury IMPRESSION: No evidence of acute intracranial or cervical spine injury. Electronically Signed   By: Tiburcio Pea M.D.   On: 08/01/2022 11:31   CT Cervical Spine Wo Contrast  Result Date: 08/01/2022 CLINICAL DATA:  Minor head trauma. EXAM: CT HEAD WITHOUT CONTRAST CT CERVICAL SPINE WITHOUT CONTRAST TECHNIQUE: Multidetector CT imaging of the head and cervical spine was performed following the standard protocol without intravenous contrast. Multiplanar CT image reconstructions of the cervical spine were also generated. RADIATION DOSE REDUCTION: This exam was performed according to the departmental dose-optimization program which includes automated exposure control, adjustment of the mA and/or kV according to patient size and/or use of iterative reconstruction technique. COMPARISON:  None Available. FINDINGS: CT HEAD FINDINGS Brain: No evidence of acute infarction, hemorrhage, hydrocephalus, extra-axial collection or mass lesion/mass effect. Vascular: No hyperdense vessel or unexpected calcification. Skull: Normal. Negative  for fracture or focal lesion. Sinuses/Orbits: No acute finding. CT CERVICAL SPINE FINDINGS Alignment: Normal. Skull base and vertebrae: No acute fracture. No primary bone lesion or focal pathologic process. Soft tissues and spinal canal: No prevertebral fluid or swelling. No visible canal hematoma. Disc levels:  Generalized disc space narrowing and ridging. Upper chest: No evidence of injury IMPRESSION: No evidence of acute intracranial or cervical spine injury. Electronically Signed   By: Tiburcio Pea M.D.   On: 08/01/2022 11:31    Assessment/Plan This is an 82 year old gentleman who lives  independently in the community with his friend Carlos [name] has a history of hypertension, hyperlipidemia, stable CAD likely developing dementia who was admitted to the hospital with NSTEMI from troponin leak, rhabdomyolysis and clavicular fracture after a fall at home.    NSTEMI (non-ST elevated myocardial infarction) (HCC)-likely type II ACS, troponin leak due to stress of fall at home on 6/8, and being down for several hours.  No consistent complaints of chest pain, he does have some mild dementia/confusion.  ER provider discussed with Dr. Eldridge Dace who did not recommend heparin. -Observation admission with cardiac telemetry -Continue to trend troponin, downtrending -Sublingual nitroglycerin for chest pain -Continue home Plavix    Hyperlipidemia-will hold statin for now given elevated CK    CAD (coronary artery disease)-continue home medications  Clavicular fracture-due to fall down the stairs on 6/8, ER provider discussed with Dr. Jena Gauss who recommends outpatient orthopedic follow-up.  Physical therapy consult for sling.    Rhabdomyolysis-due to fall at home, hydrate, recheck CK in the morning, note normal renal function    Fall at home, initial encounter in the setting of suspected Dementia without behavioral disturbance (HCC)-will consult TOC, PT/OT, patient may benefit from subacute nursing facility placement  DVT prophylaxis: Lovenox     Code Status: Full Code-patient with suspected dementia, confused about where he currently is and so cannot participate in code discussion.  I did try to contact Jamelle Haring listed on his chart, there was no answer and no voicemail available.  ED provider did speak with Bonita Quin, though she cannot make decisions for the patient.  Consults called: ER provider discussed with orthopedic surgery, cardiology as noted above.  Admission status: Observation  Time spent: 45 minutes  Jaeden Messer Sharlette Dense MD [facility] Pager [phone]  If 7PM-7AM, please  contact night-coverage [email or web address] Password [email or web address]  08/01/2022, 4:31 PM

## 2022-08-02 ENCOUNTER — Other Ambulatory Visit: Payer: Self-pay

## 2022-08-02 DIAGNOSIS — E7800 Pure hypercholesterolemia, unspecified: Secondary | ICD-10-CM | POA: Diagnosis present

## 2022-08-02 DIAGNOSIS — S42011A Anterior displaced fracture of sternal end of right clavicle, initial encounter for closed fracture: Secondary | ICD-10-CM | POA: Diagnosis present

## 2022-08-02 DIAGNOSIS — N390 Urinary tract infection, site not specified: Secondary | ICD-10-CM | POA: Diagnosis present

## 2022-08-02 DIAGNOSIS — W19XXXA Unspecified fall, initial encounter: Secondary | ICD-10-CM | POA: Diagnosis not present

## 2022-08-02 DIAGNOSIS — Z7409 Other reduced mobility: Secondary | ICD-10-CM | POA: Diagnosis present

## 2022-08-02 DIAGNOSIS — I25118 Atherosclerotic heart disease of native coronary artery with other forms of angina pectoris: Secondary | ICD-10-CM

## 2022-08-02 DIAGNOSIS — I10 Essential (primary) hypertension: Secondary | ICD-10-CM | POA: Diagnosis present

## 2022-08-02 DIAGNOSIS — W109XXA Fall (on) (from) unspecified stairs and steps, initial encounter: Secondary | ICD-10-CM | POA: Diagnosis present

## 2022-08-02 DIAGNOSIS — T796XXA Traumatic ischemia of muscle, initial encounter: Secondary | ICD-10-CM | POA: Diagnosis present

## 2022-08-02 DIAGNOSIS — Z681 Body mass index (BMI) 19 or less, adult: Secondary | ICD-10-CM | POA: Diagnosis not present

## 2022-08-02 DIAGNOSIS — F79 Unspecified intellectual disabilities: Secondary | ICD-10-CM | POA: Diagnosis present

## 2022-08-02 DIAGNOSIS — Z87891 Personal history of nicotine dependence: Secondary | ICD-10-CM | POA: Diagnosis not present

## 2022-08-02 DIAGNOSIS — Y92009 Unspecified place in unspecified non-institutional (private) residence as the place of occurrence of the external cause: Secondary | ICD-10-CM

## 2022-08-02 DIAGNOSIS — E876 Hypokalemia: Secondary | ICD-10-CM | POA: Diagnosis present

## 2022-08-02 DIAGNOSIS — E782 Mixed hyperlipidemia: Secondary | ICD-10-CM | POA: Diagnosis not present

## 2022-08-02 DIAGNOSIS — Z23 Encounter for immunization: Secondary | ICD-10-CM | POA: Diagnosis present

## 2022-08-02 DIAGNOSIS — R1310 Dysphagia, unspecified: Secondary | ICD-10-CM | POA: Diagnosis present

## 2022-08-02 DIAGNOSIS — Z8249 Family history of ischemic heart disease and other diseases of the circulatory system: Secondary | ICD-10-CM | POA: Diagnosis not present

## 2022-08-02 DIAGNOSIS — I2510 Atherosclerotic heart disease of native coronary artery without angina pectoris: Secondary | ICD-10-CM | POA: Diagnosis present

## 2022-08-02 DIAGNOSIS — G9340 Encephalopathy, unspecified: Secondary | ICD-10-CM | POA: Diagnosis present

## 2022-08-02 DIAGNOSIS — B957 Other staphylococcus as the cause of diseases classified elsewhere: Secondary | ICD-10-CM | POA: Diagnosis present

## 2022-08-02 DIAGNOSIS — Z515 Encounter for palliative care: Secondary | ICD-10-CM | POA: Diagnosis not present

## 2022-08-02 DIAGNOSIS — I21A1 Myocardial infarction type 2: Secondary | ICD-10-CM | POA: Diagnosis present

## 2022-08-02 DIAGNOSIS — Z7902 Long term (current) use of antithrombotics/antiplatelets: Secondary | ICD-10-CM | POA: Diagnosis not present

## 2022-08-02 DIAGNOSIS — I252 Old myocardial infarction: Secondary | ICD-10-CM | POA: Diagnosis not present

## 2022-08-02 DIAGNOSIS — F0390 Unspecified dementia without behavioral disturbance: Secondary | ICD-10-CM | POA: Diagnosis present

## 2022-08-02 DIAGNOSIS — Z79899 Other long term (current) drug therapy: Secondary | ICD-10-CM | POA: Diagnosis not present

## 2022-08-02 DIAGNOSIS — I214 Non-ST elevation (NSTEMI) myocardial infarction: Secondary | ICD-10-CM | POA: Diagnosis not present

## 2022-08-02 DIAGNOSIS — R2689 Other abnormalities of gait and mobility: Secondary | ICD-10-CM | POA: Diagnosis present

## 2022-08-02 DIAGNOSIS — K219 Gastro-esophageal reflux disease without esophagitis: Secondary | ICD-10-CM | POA: Diagnosis present

## 2022-08-02 LAB — BASIC METABOLIC PANEL
Anion gap: 8 (ref 5–15)
BUN: 21 mg/dL (ref 8–23)
CO2: 24 mmol/L (ref 22–32)
Calcium: 8.7 mg/dL — ABNORMAL LOW (ref 8.9–10.3)
Chloride: 112 mmol/L — ABNORMAL HIGH (ref 98–111)
Creatinine, Ser: 0.69 mg/dL (ref 0.61–1.24)
GFR, Estimated: >60 mL/min (ref >60)
Glucose, Bld: 112 mg/dL — ABNORMAL HIGH (ref 70–99)
Potassium: 3.2 mmol/L — ABNORMAL LOW (ref 3.5–5.1)
Sodium: 144 mmol/L (ref 135–145)

## 2022-08-02 LAB — CBC
HCT: 35 % — ABNORMAL LOW (ref 39.0–52.0)
Hemoglobin: 11.7 g/dL — ABNORMAL LOW (ref 13.0–17.0)
MCH: 32 pg (ref 26.0–34.0)
MCHC: 33.4 g/dL (ref 30.0–36.0)
MCV: 95.6 fL (ref 80.0–100.0)
Platelets: 168 10*3/uL (ref 150–400)
RBC: 3.66 MIL/uL — ABNORMAL LOW (ref 4.22–5.81)
RDW: 13.2 % (ref 11.5–15.5)
WBC: 9.7 10*3/uL (ref 4.0–10.5)
nRBC: 0 % (ref 0.0–0.2)

## 2022-08-02 LAB — URINE CULTURE

## 2022-08-02 LAB — CK: Total CK: 2040 U/L — ABNORMAL HIGH (ref 49–397)

## 2022-08-02 MED ORDER — LACTATED RINGERS IV SOLN: INTRAVENOUS | Status: AC

## 2022-08-02 MED ORDER — POTASSIUM CHLORIDE CRYS ER 20 MEQ PO TBCR
40.0000 meq | EXTENDED_RELEASE_TABLET | Freq: Once | ORAL | Status: AC
  Administered 2022-08-02: 40 meq via ORAL
  Filled 2022-08-02: qty 2

## 2022-08-02 MED ORDER — HALOPERIDOL LACTATE 5 MG/ML IJ SOLN
1.0000 mg | Freq: Once | INTRAMUSCULAR | Status: AC
  Administered 2022-08-02: 1 mg via INTRAVENOUS
  Filled 2022-08-02: qty 1

## 2022-08-02 NOTE — Evaluation (Addendum)
Occupational Therapy Evaluation Patient Details Name: Carlos [name] MRN: [id number] DOB: 1940-03-03 Today's Date: 08/02/2022   History of Present Illness Patient is a 82 year old male who presented on 6/9 after a fall at home with unknown time down.patient was found to have NSTEMI, clavicular fracture, and rhabdomyolysis. PMH: GERD, osteoarthritis of knee, hyperlipidemia, STEMI, suspected dementia,   Clinical Impression   Patient is a 82 year old male who was admitted for above. Patient had unclear PLOF with patient having increased confusion today and repeating certain stories (thieves stealing items in his area). Patient attempting to remove bandages on legs during session as well with consistent cues to redirect. Patient was +2 for standing attempt at EOB with strong posterior leaning. Patient also noted in chart to have R clavicle fracture with sling recommendation in note. Patient noncompliant with sling and noted to use RUE consistently during session even while being cued not to. Patient reported no pain with movement of RUE.  Patient was noted to have decreased functional activity tolerance, decreased endurance, decreased standing balance, decreased problem solving, decreased safety awareness, and decreased knowledge of AD/AE impacting participation in ADLs. Patient would continue to benefit from skilled OT services at this time while admitted and after d/c to address noted deficits in order to improve overall safety and independence in ADLs.       Recommendations for follow up therapy are one component of a multi-disciplinary discharge planning process, led by the attending physician.  Recommendations may be updated based on patient status, additional functional criteria and insurance authorization.   Assistance Recommended at Discharge Frequent or constant Supervision/Assistance  Patient can return home with the following Two people to help with walking and/or transfers;A lot of help with  bathing/dressing/bathroom;Assistance with cooking/housework;Direct supervision/assist for medications management;Assist for transportation;Help with stairs or ramp for entrance;Direct supervision/assist for financial management    Functional Status Assessment  Patient has had a recent decline in their functional status and demonstrates the ability to make significant improvements in function in a reasonable and predictable amount of time.  Equipment Recommendations  None recommended by OT       Precautions / Restrictions Precautions Precautions: Fall Precaution Comments: fell just prior to this admission, pt poor historian so not able to provide fall history Restrictions Weight Bearing Restrictions: No      Mobility Bed Mobility Overal bed mobility: Needs Assistance Bed Mobility: Supine to Sit, Sit to Supine     Supine to sit: Max assist, +2 for physical assistance Sit to supine: +2 for physical assistance   General bed mobility comments: assist to raise trunk, pivot hips and advance BLEs    Transfers Overall transfer level: Needs assistance Equipment used: 2 person hand held assist Transfers: Sit to/from Stand Sit to Stand: Mod assist, +2 physical assistance           General transfer comment: assist to power up and steady, unable to side step, flexed posture in standing, pt able to stand for ~45 seconds      Balance Overall balance assessment: Needs assistance Sitting-balance support: Feet supported Sitting balance-Leahy Scale: Fair Sitting balance - Comments: posterior lean if he doesn't have BLEs supported Postural control: Posterior lean Standing balance support: Bilateral upper extremity supported Standing balance-Leahy Scale: Poor           ADL either performed or assessed with clinical judgement   ADL Overall ADL's : Needs assistance/impaired   Eating/Feeding Details (indicate cue type and reason): patient had tray near him  wiht patient asking to take  sip of sweet tea upon therapist entrance to room. patient was repositioned to midline sitting in bed with max A with lateral lean to L noted. patient was able to hold cup and bring to mouth with no cough after small sip. patient at end of session was noted to take grape from fruit cup sitting on tray look at it while in hand attempt to bite in half then pop the whole grape into mouth. patient did attempt to talk while eating grape with noted onset of coughing and patient provided with emesis bag and encouraged to spit out the grape with nruse called into room. O2 was checked with patietn 100% on RA. patient was already positioned in upright position in bed at time of incident. SLP educated on incident. Grooming: Sitting;Minimal assistance   Upper Body Bathing: Sitting;Minimal assistance   Lower Body Bathing: Sitting/lateral leans;Maximal assistance;Total assistance   Upper Body Dressing : Sitting;Minimal assistance   Lower Body Dressing: Sitting/lateral leans;Sit to/from stand;Maximal assistance   Toilet Transfer: +2 for physical assistance;Maximal assistance Toilet Transfer Details (indicate cue type and reason): patient noted to have strong posteiror lean with inability to follow cues to transition into upright standing at EOB with +2. patient transitioned back to bed with no warning with TD to maintain on bed. Toileting- Clothing Manipulation and Hygiene: Bed level;Total assistance               Vision   Vision Assessment?: No apparent visual deficits            Pertinent Vitals/Pain Pain Assessment Pain Assessment: Faces Faces Pain Scale: No hurt        Extremity/Trunk Assessment Upper Extremity Assessment Upper Extremity Assessment: Generalized weakness;RUE deficits/detail RUE Deficits / Details: clavicle fracture with recommendation to use sling, patient is non compliant with sling use. patient actively using arm during session with no redirectablity noted.   Lower  Extremity Assessment Lower Extremity Assessment: Defer to PT evaluation   Cervical / Trunk Assessment Cervical / Trunk Assessment: Kyphotic   Communication     Cognition Arousal/Alertness: Awake/alert Behavior During Therapy: WFL for tasks assessed/performed Overall Cognitive Status: No family/caregiver present to determine baseline cognitive functioning                 General Comments: pt oriented to self and to year, perseverates on talking about thieves attempting to steal his things, requires multimodal cues to follow 1 step commands                Home Living Family/patient expects to be discharged to:: Private residence              Prior Functioning/Environment Prior Level of Function : Patient poor historian/Family not available             Mobility Comments: pt confused, not able to provide hx, did state he fell in the basement ADLs Comments: patient reported he was independent        OT Problem List: Decreased activity tolerance;Impaired balance (sitting and/or standing);Decreased coordination;Decreased safety awareness;Decreased knowledge of precautions;Decreased knowledge of use of DME or AE;Cardiopulmonary status limiting activity;Impaired UE functional use      OT Treatment/Interventions: Self-care/ADL training;Energy conservation;Therapeutic exercise;DME and/or AE instruction;Therapeutic activities;Patient/family education;Balance training    OT Goals(Current goals can be found in the care plan section) Acute Rehab OT Goals Patient Stated Goal: none stated OT Goal Formulation: Patient unable to participate in goal setting Time For Goal Achievement: 08/16/22 Potential to  Achieve Goals: Fair  OT Frequency: Min 2X/week       AM-PAC OT "6 Clicks" Daily Activity     Outcome Measure Help from another person eating meals?: A Little Help from another person taking care of personal grooming?: A Lot Help from another person toileting, which  includes using toliet, bedpan, or urinal?: A Lot Help from another person bathing (including washing, rinsing, drying)?: A Lot Help from another person to put on and taking off regular upper body clothing?: A Little Help from another person to put on and taking off regular lower body clothing?: A Lot 6 Click Score: 14   End of Session Equipment Utilized During Treatment: Gait belt Nurse Communication: Other (comment) (nurse okayed patient to be seen by therapy and then in room with coughing incident)  Activity Tolerance: Patient limited by fatigue Patient left: in bed;with call bell/phone within reach;with bed alarm set  OT Visit Diagnosis: Unsteadiness on feet (R26.81);Other abnormalities of gait and mobility (R26.89);History of falling (Z91.81);Muscle weakness (generalized) (M62.81)                Time: 1015-1040 OT Time Calculation (min): 25 min Charges:  OT General Charges $OT Visit: 1 Visit OT Evaluation $OT Eval Low Complexity: 1 Low OT Treatments $Self Care/Home Management : 8-22 mins  Rosalio Loud, MS Acute Rehabilitation Department Office# [phone]   Selinda Flavin 08/02/2022, 12:47 PM

## 2022-08-02 NOTE — Progress Notes (Signed)
Pt very confused and agitated. Pt is constantly getting out of bed and is un redirectable.NP Garner Nash notified. Pt placed on waist belt restraints and mitts. Notified [name] explained the situation and all questions were answered.Pt will also have a sitter when one gets available. Will continue to monitor pt.

## 2022-08-02 NOTE — [place] FL2 (Signed)
[place] MEDICAID FL2 LEVEL OF CARE FORM     IDENTIFICATION  Patient Name: Carlos [name] Birthdate: [birth year] Sex: male Admission Date (Current Location): 08/01/2022  [facility] and [place] Number:  Producer, television/film/video and Address:  [facility],  [address] [place], [place] [address]      Provider Number: [id number]  Attending Physician Name and Address:  Meredeth Ide, MD  Relative Name and Phone Number:  Manya Silvas (Niece) [phone] [facility])    Current Level of Care: Hospital Recommended Level of Care: Skilled Nursing Facility Prior Approval Number:    Date Approved/Denied:   PASRR Number: [id number]  Discharge Plan: SNF    Current Diagnoses: Patient Active Problem List   Diagnosis Date Noted   NSTEMI (non-ST elevated myocardial infarction) (HCC) 08/01/2022   Rhabdomyolysis 08/01/2022   Fall at home, initial encounter 08/01/2022   Dementia without behavioral disturbance (HCC) 08/01/2022   Amnesia 09/29/2021   Osteoarthritis of knee 09/29/2021   Other long term (current) drug therapy 09/29/2021   Poor balance 09/29/2021   Screening for malignant neoplasm of prostate 09/29/2021   Hypercholesteremia    GERD (gastroesophageal reflux disease)    Angina pectoris (HCC)    Hyperlipidemia 05/14/2013   CAD (coronary artery disease) 05/14/2013   ST elevation myocardial infarction (STEMI) of lateral wall, initial episode of care (HCC) 05/12/2013    Orientation RESPIRATION BLADDER Height & Weight     Self, Place  Normal Incontinent Weight: 140 lb 10.5 oz (63.8 kg) Height:  5\' 11"  (180.3 cm)  BEHAVIORAL SYMPTOMS/MOOD NEUROLOGICAL BOWEL NUTRITION STATUS      Continent Diet (DYS 3)  AMBULATORY STATUS COMMUNICATION OF NEEDS Skin   Limited Assist Verbally Normal                       Personal Care Assistance Level of Assistance  Bathing, Feeding, Dressing Bathing Assistance: Limited assistance Feeding assistance: Independent Dressing Assistance:  Limited assistance     Functional Limitations Info  Sight, Hearing, Speech Sight Info: Adequate Hearing Info: Adequate Speech Info: Adequate    SPECIAL CARE FACTORS FREQUENCY  PT (By licensed PT), OT (By licensed OT)     PT Frequency: 5 x a week OT Frequency: 5 x a week            Contractures Contractures Info: Not present    Additional Factors Info  Code Status, Allergies Code Status Info: full Allergies Info: NKA           Current Medications (08/02/2022):  This is the current hospital active medication list Current Facility-Administered Medications  Medication Dose Route Frequency Provider Last Rate Last Admin   acetaminophen (TYLENOL) tablet 650 mg  650 mg Oral Q6H PRN Kirby Crigler, Mir M, MD       Or   acetaminophen (TYLENOL) suppository 650 mg  650 mg Rectal Q6H PRN Kirby Crigler, Mir M, MD       albuterol (PROVENTIL) (2.5 MG/3ML) 0.083% nebulizer solution 2.5 mg  2.5 mg Nebulization Q2H PRN Kirby Crigler, Mir M, MD       cefTRIAXone (ROCEPHIN) 1 g in sodium chloride 0.9 % 100 mL IVPB  1 g Intravenous Q24H Kirby Crigler, Mir M, MD 200 mL/hr at 08/02/22 0827 1 g at 08/02/22 0827   clopidogrel (PLAVIX) tablet 75 mg  75 mg Oral Daily Kirby Crigler, Mir M, MD   75 mg at 08/02/22 0820   docusate sodium (COLACE) capsule 100 mg  100 mg Oral BID Maryln Gottron, MD  100 mg at 08/02/22 0820   enoxaparin (LOVENOX) injection 40 mg  40 mg Subcutaneous Q24H Kirby Crigler, Mir M, MD   40 mg at 08/01/22 2156   lactated ringers infusion   Intravenous Continuous Meredeth Ide, MD       metoprolol succinate (TOPROL-XL) 24 hr tablet 25 mg  25 mg Oral Daily Kirby Crigler, Mir M, MD   25 mg at 08/02/22 0820   ondansetron (ZOFRAN) tablet 4 mg  4 mg Oral Q6H PRN Kirby Crigler, Mir M, MD       Or   ondansetron [facility]) injection 4 mg  4 mg Intravenous Q6H PRN Kirby Crigler, Mir M, MD       polyethylene glycol (MIRALAX / GLYCOLAX) packet 17 g  17 g Oral Daily PRN Maryln Gottron, MD         Discharge  Medications: Please see discharge summary for a list of discharge medications.  Relevant Imaging Results:  Relevant Lab Results:   Additional Information SSN:[id number]  Valentina Shaggy Rihanna Marseille, LCSW

## 2022-08-02 NOTE — Progress Notes (Signed)
Orthopedic Tech Progress Note Patient Details:  HENSON FRATICELLI Nov 10, 1940 [id number]  Ortho Devices Type of Ortho Device: Shoulder immobilizer Ortho Device/Splint Location: LUE Ortho Device/Splint Interventions: Ordered   Post Interventions Instructions Provided: Care of device Attempted to apply sling however the patient refused and stated he did not want it on because his arm did not hurt. Sling left at bedside and RN notified.  Darleen Crocker 08/02/2022, 9:36 AM

## 2022-08-02 NOTE — Progress Notes (Signed)
[facility]  PROGRESS NOTE  Carlos [name] ZOX:[id number] DOB: 31-Jan-1941 DOA: 08/01/2022 PCP: Daisy Floro, MD   Brief HPI:    82 year old male with history of CAD, hypertension, hyperlipidemia was brought to hospital after being found down at home.  As per patient he did not pass out but fell down from stairs.  Evaluation in the ED showed CK1 888, troponin 773.  Imaging study showed proximal right clavicular head nondisplaced fracture and old coccyx fracture.  ED provider discussed with orthopedics they recommended outpatient follow-up for clavicular fracture.  ED provider discussed with cardiology Dr. Eldridge Dace, who reviewed EKG and did not feel this was acute coronary syndrome.  Felt like type II NSTEMI.    Assessment/Plan:   S/p fall/rhabdomyolysis -Patient found on floor -CK elevated to 1888; this morning CK is 2040 -Start LR at 100 mL/h  NSTEMI, type II -Troponin elevated likely in setting of fall/rhabdomyolysis -EKG unremarkable, cardiology reviewed the EKG and did not feel patient needs any further management from cardiac standpoint -Troponin was 773, 628 yesterday  CAD -Continue Plavix, metoprolol  Clavicle fracture -Fell downstairs on 6/8 -ED provider discussed with Dr. Jena Gauss who recommended outpatient orthopedic follow-up  Hypokalemia -Potassium was 3.2 -Replace potassium and follow BMP in am   Medications     clopidogrel  75 mg Oral Daily   docusate sodium  100 mg Oral BID   enoxaparin (LOVENOX) injection  40 mg Subcutaneous Q24H   metoprolol succinate  25 mg Oral Daily     Data Reviewed:   CBG:  No results for input(s): "GLUCAP" in the last 168 hours.  SpO2: 99 %    Vitals:   08/01/22 2355 08/02/22 0216 08/02/22 0540 08/02/22 1232  BP:  (!) 122/54 102/61 109/77  Pulse:  63 (!) 58 60  Resp:  16 14 18   Temp:  98.8 F (37.1 C) 97.9 F (36.6 C) 98.8 F (37.1 C)  TempSrc:  Oral Oral Oral  SpO2:  99% 99% 99%  Weight:      Height: 5'  11" (1.803 m)         Data Reviewed:  Basic Metabolic Panel: Recent Labs  Lab 08/01/22 1026 08/02/22 0420  NA 144 144  K 3.7 3.2*  CL 112* 112*  CO2 25 24  GLUCOSE 104* 112*  BUN 27* 21  CREATININE 0.77 0.69  CALCIUM 9.2 8.7*    CBC: Recent Labs  Lab 08/01/22 1026 08/02/22 0420  WBC 9.7 9.7  NEUTROABS 8.4*  --   HGB 12.1* 11.7*  HCT 37.5* 35.0*  MCV 96.9 95.6  PLT 177 168    LFT Recent Labs  Lab 08/01/22 1026  AST 54*  ALT 25  ALKPHOS 55  BILITOT 1.6*  PROT 6.6  ALBUMIN 4.0     Antibiotics: Anti-infectives (From admission, onward)    Start     Dose/Rate Route Frequency Ordered Stop   08/02/22 1000  cefTRIAXone (ROCEPHIN) 1 g in sodium chloride 0.9 % 100 mL IVPB        1 g 200 mL/hr over 30 Minutes Intravenous Every 24 hours 08/01/22 1629 08/06/22 0959   08/01/22 1445  cefTRIAXone (ROCEPHIN) 1 g in sodium chloride 0.9 % 100 mL IVPB        1 g 200 mL/hr over 30 Minutes Intravenous  Once 08/01/22 1436 08/01/22 1523        DVT prophylaxis: Lovenox  Code Status: Full code  Family Communication:    CONSULTS    Subjective  Patient seen and examined, denies chest pain.   Objective    Physical Examination:   General: Appears in no acute distress Cardiovascular: S1-S2, regular, no murmur auscultated Respiratory: Lungs clear to auscultation bilaterally Abdomen: Abdomen is soft, nontender, no organomegaly Extremities: No edema in the lower extremities Neurologic: Alert, oriented to self and place only   Status is: Inpatient:             Meredeth Ide   [facility] If 7PM-7AM, please contact night-coverage at [email or web address], Office  [phone]   08/02/2022, 3:16 PM  LOS: 0 days
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
## 2022-08-02 NOTE — Evaluation (Signed)
Clinical/Bedside Swallow Evaluation Patient Details  Name: Carlos [name] MRN: [id number] Date of Birth: 14-Jun-1940  Today's Date: 08/02/2022 Time: SLP Start Time (ACUTE ONLY): 1155 SLP Stop Time (ACUTE ONLY): 1215 SLP Time Calculation (min) (ACUTE ONLY): 20 min  Past Medical History:  Past Medical History:  Diagnosis Date   Amnesia 09/29/2021   Angina pectoris (HCC)    CAD (coronary artery disease) 05/14/2013   GERD (gastroesophageal reflux disease)    Hypercholesteremia    Hyperlipidemia 05/14/2013   Osteoarthritis of knee 09/29/2021   Other long term (current) drug therapy 09/29/2021   Poor balance 09/29/2021   Screening for malignant neoplasm of prostate 09/29/2021   ST elevation myocardial infarction (STEMI) of lateral wall, initial episode of care (HCC) 05/12/2013   Past Surgical History:  Past Surgical History:  Procedure Laterality Date   ABDOMINAL SURGERY     CARDIAC CATHETERIZATION     CORONARY ANGIOPLASTY     LEFT HEART CATHETERIZATION WITH CORONARY ANGIOGRAM N/A 05/12/2013   Procedure: LEFT HEART CATHETERIZATION WITH CORONARY ANGIOGRAM;  Surgeon: Micheline Chapman, MD;  Location: [facility] CATH LAB;  Service: Cardiovascular;  Laterality: N/A;   PERCUTANEOUS STENT INTERVENTION N/A 05/12/2013   Procedure: PERCUTANEOUS STENT INTERVENTION;  Surgeon: Micheline Chapman, MD;  Location: [facility] CATH LAB;  Service: Cardiovascular;  Laterality: N/A;   HPI:  Patient is an 82 y.o. male with PMH: HTN, HLD,GERD stable CAD, likely developing dementia who was admitted to the hospital on 08/01/22 with NSTEMI from troponin leak, rhabdomyolysis and clavicular fracture after a fall at home.  At baseline, he is independent in the community and lives with his friend [name]'s sister for last couple of decades. (per report, Bonita Quin has a developmental delay and in addition, she helps care for someone else). In ED, patient with multple skin tears, disheveled, smelling of urine with dried feces on his legs; lab workup  unremarkable; CT head, CT chest/abd/pelvis and CT cervical spine completed and showed roximal right clavicular head nondisplaced fracture, and an old coccyx fracture but no evidence of acute intracranial or cervical spine injury.    Assessment / Plan / Recommendation  Clinical Impression  Patient is presenting with clinical s/s of what appears to be a cognitive-based dysphagia but cannot r/o pharyngeal impairment. (in addition, patient with h/o GERD) Patient only accepted one sip of water, one sip of cola and one bite of applesauce. With water, patient exhibited multiple swallows (3-4) as well as holding some of the water in mouth and needing to spit it out. After spitting out some of water, he exhibited a cough response. After recovering, he drank a cup sip of cola, and although he started to make a facial expression of discomfort as he did with water, he did not have any coughing or other observed difficulties. PT and OT worked with patient earlier and they informed SLP that they witnessed him bite a grape in half, and then seem to have trouble swallowing it, leading to him coughing the grape out into emesis bag. SLP has changed patient's PO consistency to mechanical soft (dysphagia 3), continue with thin liquids but with full supervision with meals. SLP will follow for diet toleration, determine need for objective swallow study (MBS) and potential for upgrade. SLP Visit Diagnosis: Dysphagia, unspecified (R13.10)    Aspiration Risk  Mild aspiration risk    Diet Recommendation Dysphagia 3 (Mech soft);Thin liquid   Liquid Administration via: Straw;Cup Medication Administration: Whole meds with puree Supervision: Full supervision/cueing for compensatory strategies Compensations: Slow  rate;Small sips/bites;Minimize environmental distractions Postural Changes: Remain upright for at least 30 minutes after po intake;Seated upright at 90 degrees    Other  Recommendations Oral Care Recommendations: Oral  care BID;Staff/trained caregiver to provide oral care    Recommendations for follow up therapy are one component of a multi-disciplinary discharge planning process, led by the attending physician.  Recommendations may be updated based on patient status, additional functional criteria and insurance authorization.  Follow up Recommendations Other (comment) (SLP at next venue of care)      Assistance Recommended at Discharge    Functional Status Assessment    Frequency and Duration min 2x/week  2 weeks       Prognosis Prognosis for improved oropharyngeal function: Good Barriers to Reach Goals: Cognitive deficits;Behavior      Swallow Study   General Date of Onset: 08/01/22 HPI: Patient is an 82 y.o. male with PMH: HTN, HLD,GERD stable CAD, likely developing dementia who was admitted to the hospital on 08/01/22 with NSTEMI from troponin leak, rhabdomyolysis and clavicular fracture after a fall at home.  At baseline, he is independent in the community and lives with his friend [name]'s sister for last couple of decades. (per report, Bonita Quin has a developmental delay and in addition, she helps care for someone else). In ED, patient with multple skin tears, disheveled, smelling of urine with dried feces on his legs; lab workup unremarkable; CT head, CT chest/abd/pelvis and CT cervical spine completed and showed roximal right clavicular head nondisplaced fracture, and an old coccyx fracture but no evidence of acute intracranial or cervical spine injury. Type of Study: Bedside Swallow Evaluation Previous Swallow Assessment: none found Diet Prior to this Study: Regular;Thin liquids (Level 0) Temperature Spikes Noted: No Respiratory Status: Room air History of Recent Intubation: No Behavior/Cognition: Alert;Cooperative;Confused;Requires cueing Oral Cavity Assessment: Within Functional Limits Oral Care Completed by SLP: No Oral Cavity - Dentition: Adequate natural dentition Vision: Functional for  self-feeding Self-Feeding Abilities: Able to feed self Patient Positioning: Upright in bed Baseline Vocal Quality: Normal Volitional Cough: Cognitively unable to elicit Volitional Swallow: Unable to elicit    Oral/Motor/Sensory Function Overall Oral Motor/Sensory Function: Within functional limits   Ice Chips     Thin Liquid Thin Liquid: Impaired Presentation: Straw;Cup Oral Phase Functional Implications: Oral holding Pharyngeal  Phase Impairments: Suspected delayed Swallow;Multiple swallows;Cough - Immediate    Nectar Thick Nectar Thick Liquid: Not tested Other Comments: patient refused   Honey Thick Honey Thick Liquid: Not tested   Puree Puree: Impaired Pharyngeal Phase Impairments: Multiple swallows   Solid     Solid: Not tested     Angela Nevin, MA, CCC-SLP Speech Therapy

## 2022-08-02 NOTE — Evaluation (Signed)
Physical Therapy Evaluation Patient Details Name: Carlos [name] MRN: [id number] DOB: [birth year] Today's Date: 08/02/2022  History of Present Illness  82 year old gentleman who lives independently in the community with his friend [name] has a history of hypertension, hyperlipidemia, stable CAD likely developing dementia who was admitted to the hospital with NSTEMI from troponin leak, rhabdomyolysis and clavicular fracture after a fall at home  Clinical Impression  Pt admitted with above diagnosis. +2 max assist for bed mobility. Pt able to stand at edge of bed with hand held assist of 2 with flexed trunk posture, he was unable to weight shift to take any side steps. Pt is oriented to self and year only, not able to provide prior level of function. 24/7 assistance recommended.  Pt currently with functional limitations due to the deficits listed below (see PT Problem List). Pt will benefit from acute skilled PT to increase their independence and safety with mobility to allow discharge.          Recommendations for follow up therapy are one component of a multi-disciplinary discharge planning process, led by the attending physician.  Recommendations may be updated based on patient status, additional functional criteria and insurance authorization.  Follow Up Recommendations Can patient physically be transported by private vehicle: No     Assistance Recommended at Discharge Frequent or constant Supervision/Assistance  Patient can return home with the following  Two people to help with walking and/or transfers;Two people to help with bathing/dressing/bathroom;Assistance with cooking/housework;Assist for transportation;Direct supervision/assist for medications management;Direct supervision/assist for financial management;Help with stairs or ramp for entrance    Equipment Recommendations None recommended by PT  Recommendations for Other Services       Functional Status Assessment Patient has had a  recent decline in their functional status and demonstrates the ability to make significant improvements in function in a reasonable and predictable amount of time.     Precautions / Restrictions Precautions Precautions: Fall Precaution Comments: fell just prior to this admission, pt poor historian so not able to provide fall history Restrictions Weight Bearing Restrictions: No      Mobility  Bed Mobility Overal bed mobility: Needs Assistance Bed Mobility: Supine to Sit, Sit to Supine     Supine to sit: Max assist, +2 for physical assistance Sit to supine: +2 for physical assistance   General bed mobility comments: assist to raise trunk, pivot hips and advance BLEs    Transfers Overall transfer level: Needs assistance Equipment used: 2 person hand held assist Transfers: Sit to/from Stand Sit to Stand: Mod assist, +2 physical assistance           General transfer comment: assist to power up and steady, unable to side step, flexed posture in standing, pt able to stand for ~45 seconds    Ambulation/Gait                  Stairs            Wheelchair Mobility    Modified Rankin (Stroke Patients Only)       Balance Overall balance assessment: Needs assistance Sitting-balance support: Feet supported Sitting balance-Leahy Scale: Fair Sitting balance - Comments: posterior lean if he doesn't have BLEs supported Postural control: Posterior lean Standing balance support: Bilateral upper extremity supported Standing balance-Leahy Scale: Poor                               Pertinent Vitals/Pain Pain Assessment Pain Assessment:  PAINAD Breathing: normal Negative Vocalization: none Facial Expression: smiling or inexpressive Body Language: relaxed Consolability: no need to console PAINAD Score: 0    Home Living Family/patient expects to be discharged to:: Private residence                        Prior Function Prior Level of  Function : Patient poor historian/Family not available             Mobility Comments: pt confused, not able to provide hx, did state he fell in the basement       Hand Dominance        Extremity/Trunk Assessment   Upper Extremity Assessment Upper Extremity Assessment: Defer to OT evaluation    Lower Extremity Assessment Lower Extremity Assessment: Generalized weakness    Cervical / Trunk Assessment Cervical / Trunk Assessment: Kyphotic  Communication      Cognition Arousal/Alertness: Awake/alert Behavior During Therapy: WFL for tasks assessed/performed Overall Cognitive Status: No family/caregiver present to determine baseline cognitive functioning                                 General Comments: pt oriented to self and to year, perseverates on talking about thieves attempting to steal his things, requires multimodal cues to follow 1 step commands        General Comments      Exercises     Assessment/Plan    PT Assessment Patient needs continued PT services  PT Problem List Decreased strength;Decreased mobility;Decreased activity tolerance;Pain;Decreased cognition;Decreased balance       PT Treatment Interventions Therapeutic activities;Functional mobility training;Therapeutic exercise;Patient/family education;DME instruction    PT Goals (Current goals can be found in the Care Plan section)  Acute Rehab PT Goals PT Goal Formulation: Patient unable to participate in goal setting Time For Goal Achievement: 08/16/22 Potential to Achieve Goals: Fair    Frequency Min 1X/week     Co-evaluation PT/OT/SLP Co-Evaluation/Treatment: Yes             AM-PAC PT "6 Clicks" Mobility  Outcome Measure Help needed turning from your back to your side while in a flat bed without using bedrails?: A Lot Help needed moving from lying on your back to sitting on the side of a flat bed without using bedrails?: Total Help needed moving to and from a bed  to a chair (including a wheelchair)?: Total Help needed standing up from a chair using your arms (e.g., wheelchair or bedside chair)?: Total Help needed to walk in hospital room?: Total Help needed climbing 3-5 steps with a railing? : Total 6 Click Score: 7    End of Session Equipment Utilized During Treatment: Gait belt Activity Tolerance: Patient tolerated treatment well Patient left: in bed;with call bell/phone within reach;with nursing/sitter in room Nurse Communication: Mobility status;Other (comment) (RN called to room 2* pt had long episode of coughing after attempting to eat a grape) PT Visit Diagnosis: Unsteadiness on feet (R26.81);History of falling (Z91.81);Muscle weakness (generalized) (M62.81)    Time: 8295-6213 PT Time Calculation (min) (ACUTE ONLY): 15 min   Charges:   PT Evaluation $PT Eval Moderate Complexity: 1 Mod          Tamala Ser PT 08/02/2022  Acute Rehabilitation Services  Office [phone]

## 2022-08-02 NOTE — Progress Notes (Signed)
Chaplain engaged in an initial visit with Carlos [name]' nurse. Carlos [name]' is experiencing confusion and at this time we won't be able to complete an Scientist, water quality, Healthcare POA. Chaplain will follow-up with family later today to discuss process and procedures for document.   Carlos [name], MDiv  08/02/22 1100  Spiritual Encounters  Type of Visit Initial  Conversation partners present during encounter Nurse  Referral source Family  Reason for visit Advance directives

## 2022-08-03 ENCOUNTER — Encounter (HOSPITAL_COMMUNITY): Payer: Self-pay | Admitting: Family Medicine

## 2022-08-03 DIAGNOSIS — I25118 Atherosclerotic heart disease of native coronary artery with other forms of angina pectoris: Secondary | ICD-10-CM | POA: Diagnosis not present

## 2022-08-03 DIAGNOSIS — I214 Non-ST elevation (NSTEMI) myocardial infarction: Secondary | ICD-10-CM | POA: Diagnosis not present

## 2022-08-03 DIAGNOSIS — E782 Mixed hyperlipidemia: Secondary | ICD-10-CM | POA: Diagnosis not present

## 2022-08-03 DIAGNOSIS — W19XXXA Unspecified fall, initial encounter: Secondary | ICD-10-CM | POA: Diagnosis not present

## 2022-08-03 LAB — COMPREHENSIVE METABOLIC PANEL
ALT: 39 U/L (ref 0–44)
AST: 75 U/L — ABNORMAL HIGH (ref 15–41)
Albumin: 3.7 g/dL (ref 3.5–5.0)
Alkaline Phosphatase: 53 U/L (ref 38–126)
Anion gap: 11 (ref 5–15)
BUN: 14 mg/dL (ref 8–23)
CO2: 21 mmol/L — ABNORMAL LOW (ref 22–32)
Calcium: 8.8 mg/dL — ABNORMAL LOW (ref 8.9–10.3)
Chloride: 108 mmol/L (ref 98–111)
Creatinine, Ser: 0.65 mg/dL (ref 0.61–1.24)
GFR, Estimated: >60 mL/min (ref >60)
Glucose, Bld: 106 mg/dL — ABNORMAL HIGH (ref 70–99)
Potassium: 3.5 mmol/L (ref 3.5–5.1)
Sodium: 140 mmol/L (ref 135–145)
Total Bilirubin: 1.6 mg/dL — ABNORMAL HIGH (ref 0.3–1.2)
Total Protein: 6.1 g/dL — ABNORMAL LOW (ref 6.5–8.1)

## 2022-08-03 LAB — CBC
HCT: 36.6 % — ABNORMAL LOW (ref 39.0–52.0)
Hemoglobin: 12 g/dL — ABNORMAL LOW (ref 13.0–17.0)
MCH: 31.4 pg (ref 26.0–34.0)
MCHC: 32.8 g/dL (ref 30.0–36.0)
MCV: 95.8 fL (ref 80.0–100.0)
Platelets: 164 10*3/uL (ref 150–400)
RBC: 3.82 MIL/uL — ABNORMAL LOW (ref 4.22–5.81)
RDW: 13 % (ref 11.5–15.5)
WBC: 10.5 10*3/uL (ref 4.0–10.5)
nRBC: 0 % (ref 0.0–0.2)

## 2022-08-03 LAB — URINE CULTURE: Culture: 80000 — AB

## 2022-08-03 LAB — CK: Total CK: 1566 U/L — ABNORMAL HIGH (ref 49–397)

## 2022-08-03 MED ORDER — METOPROLOL TARTRATE 25 MG PO TABS
12.5000 mg | ORAL_TABLET | Freq: Two times a day (BID) | ORAL | Status: DC
  Administered 2022-08-05 – 2022-08-06 (×3): 12.5 mg via ORAL
  Filled 2022-08-03 (×4): qty 1

## 2022-08-03 MED ORDER — SODIUM CHLORIDE 0.9 % IV SOLN
100.0000 mg | Freq: Two times a day (BID) | INTRAVENOUS | Status: DC
  Administered 2022-08-03 – 2022-08-05 (×4): 100 mg via INTRAVENOUS
  Filled 2022-08-03 (×5): qty 100

## 2022-08-03 NOTE — TOC Progression Note (Addendum)
Transition of Care [facility]) - Progression Note    Patient Details  Name: Carlos [name] MRN: [id number] Date of Birth: 24-Jan-1941  [facility]) CM/SW Contact  Larrie Kass, LCSW Phone Number: 08/03/2022, 10:41 AM  Clinical Narrative:    CSW spoke with pt's Niece [name] and presented bed offers. She is requesting time to review. TOC to follow.    ADDEN 12:00pm  CSW spoke with pt's Niece she stated she would not want pt's GF to receive information about pt's medical condition. She reports she will update pt's GF herself. TOC to follow.   Expected Discharge Plan:  (TBD) Barriers to Discharge: Continued Medical Work up  Expected Discharge Plan and Services                                               Social Determinants of Health (SDOH) Interventions SDOH Screenings   Food Insecurity: No Food Insecurity (08/01/2022)  Housing: Low Risk  (08/01/2022)  Transportation Needs: Patient Unable To Answer (08/01/2022)  Utilities: Not At Risk (08/01/2022)  Tobacco Use: Medium Risk (09/30/2021)    Readmission Risk Interventions     No data to display

## 2022-08-03 NOTE — Progress Notes (Signed)
Speech Language Pathology Treatment: Dysphagia  Patient Details Name: Carlos [name] MRN: [id number] DOB: 1940-08-15 Today's Date: 08/03/2022 Time: 0917-1000 SLP Time Calculation (min) (ACUTE ONLY): 43 min  Assessment / Plan / Recommendation Clinical Impression  Pt seen for dysphagia management due to RN contacting this SLP with concerns for pt aspirating.  Pt was lethargic upon walking into room but did awake adequately to speak to SLP and state "It's a waste of time" and "You just want more money" regarding recommendations.    After oral care, provided pt with intake including ice chips, thin water and nectar thick juice.  Significant delay in swallow noted - with sub-swallows up to 5 with each bolus, followed by immediate throat clearing and delayed cough concerning for airway infiltration.  Inconsistent swallow despite total cues from SLP to swallow - including coutning 1,2,3... swallow; requiring SLP to orally suction to prevent aspiration.    Pt declined further po offerings including [place] ice, etc.    Pt is strong enough to cough and expectorate viscous white secretions after moisture - thus recommend single ice chips and medicine cup boluses of thin water only when fully alert, willing to consume and swallowing.  Oral suction if pt does not elicit swallow please.    Phoned pt's niece Carlos [name]* with pt's permission and relayed concerns for pt's swallowing - which she confirmed her concerns as well.  Carlos [name] reports pt has had significant weight loss and she has concerns about his care.  Pt's prognosis for swallow to return to functional level for adequate nutrition, hydration with airway protection is guarded.    SLP will follow up next date to determine readiness for po intake.  RN, Comptroller and nurse tech informed and agreeable to plan.   Highly recommend consider palliative consult to establish GOC with this pt who dementia and appears with progressive decline from conversation with niece.      HPI HPI: Patient is an 82 y.o. male with PMH: HTN, HLD,GERD stable CAD, likely developing dementia who was admitted to the hospital on 08/01/22 with NSTEMI from troponin leak, rhabdomyolysis and clavicular fracture after a fall at home.  At baseline, he is independent in the community and lives with his friend Carlos [name] for last couple of decades. (per report, Carlos [name] has a developmental delay and in addition, she helps care for someone else). In ED, patient with multple skin tears, disheveled, smelling of urine with dried feces on his legs; lab workup unremarkable; CT head, CT chest/abd/pelvis and CT cervical spine completed and showed roximal right clavicular head nondisplaced fracture, and an old coccyx fracture but no evidence of acute intracranial or cervical spine injury.  Pt underwent BSE on 6/10 and was placed on dys3/thin diet - RN reached out to SLP today to relay concern for pt aspirating across all po intake and requested pt to be seen again.      SLP Plan  Continue with current plan of care      Recommendations for follow up therapy are one component of a multi-disciplinary discharge planning process, led by the attending physician.  Recommendations may be updated based on patient status, additional functional criteria and insurance authorization.    Recommendations  Diet recommendations: Other(comment) (sips and chips) Medication Administration: Via alternative means Compensations: Slow rate;Small sips/bites;Other (Comment) (oral suction if pt does not swallow) Postural Changes and/or Swallow Maneuvers: Out of bed for meals;Seated upright 90 degrees (place pt's head in neutral position)  Oral care BID;Staff/trained caregiver to provide oral care   Frequent or constant Supervision/Assistance Dysphagia, unspecified (R13.10)     Continue with current plan of care    Carlos Infante, MS [facility] SLP Acute Rehab Services Office [phone]  Carlos [name]  08/03/2022, 10:08 AM

## 2022-08-03 NOTE — Progress Notes (Signed)
Patient had 2 visitors present in room- per visitors, they were patient's girlfriend [name] and Sally's sister. Both wanted an update on patient's condition and had several questions for this RN. This RN informed [name] and her sister that RN could not give them any information since they are not listed as emergency contacts- visitors also did not have patient's provided password that has been set up. Visitors became very upset and requested Child psychotherapist. RN informed visitors that they would have to receive updates on patient's condition from patient's family members- niece and nephews. Visitors then began yelling at each other in patient's room and at nurse's station. Visitors then left facility after no patient information would be provided.

## 2022-08-03 NOTE — Progress Notes (Signed)
[facility]  PROGRESS NOTE  Carlos [name] ZOX:[id number] DOB: [birth year] DOA: 08/01/2022 PCP: Daisy Floro, MD   Brief HPI:    82 year old male with history of CAD, hypertension, hyperlipidemia was brought to hospital after being found down at home.  As per patient he did not pass out but fell down from stairs.  Evaluation in the ED showed CK1 888, troponin 773.  Imaging study showed proximal right clavicular head nondisplaced fracture and old coccyx fracture.  ED provider discussed with orthopedics they recommended outpatient follow-up for clavicular fracture.  ED provider discussed with cardiology Dr. Eldridge Dace, who reviewed EKG and did not feel this was acute coronary syndrome.  Felt like type II NSTEMI.    Assessment/Plan:   S/p fall/rhabdomyolysis -Patient found on floor -CK elevated to 2040, improved to 1566 this morning  -Continue LR at 100 mL/h -Follow CK level in a.m.  NSTEMI, type II -Troponin elevated likely in setting of fall/rhabdomyolysis -EKG unremarkable, cardiology reviewed the EKG and did not feel patient needs any further management from cardiac standpoint -Troponin was 773, 628 yesterday  Staph epidermidis UTI -Sensitive to doxycycline -Will discontinue ceftriaxone -Start IV doxycycline  CAD -Continue Plavix, metoprolol  Clavicle fracture -Fell downstairs on 6/8 -ED provider discussed with Dr. Jena Gauss who recommended outpatient orthopedic follow-up  Hypokalemia -Replete  Dysphagia -Swallow evaluation obtained -High risk for aspiration -Patient has dementia and has been on progressive decline -Will consult palliative care for goals of care discussion  Medications     clopidogrel  75 mg Oral Daily   docusate sodium  100 mg Oral BID   enoxaparin (LOVENOX) injection  40 mg Subcutaneous Q24H   metoprolol tartrate  12.5 mg Oral BID     Data Reviewed:   CBG:  No results for input(s): "GLUCAP" in the last 168 hours.  SpO2: 100 %     Vitals:   08/02/22 0540 08/02/22 1232 08/02/22 2016 08/03/22 0600  BP: 102/61 109/77 (!) 155/82 (!) 152/90  Pulse: (!) 58 60 69 84  Resp: 14 18 18 18   Temp: 97.9 F (36.6 C) 98.8 F (37.1 C) 98.5 F (36.9 C) 98.2 F (36.8 C)  TempSrc: Oral Oral Oral Oral  SpO2: 99% 99% 100% 100%  Weight:      Height:          Data Reviewed:  Basic Metabolic Panel: Recent Labs  Lab 08/01/22 1026 08/02/22 0420 08/03/22 0423  NA 144 144 140  K 3.7 3.2* 3.5  CL 112* 112* 108  CO2 25 24 21*  GLUCOSE 104* 112* 106*  BUN 27* 21 14  CREATININE 0.77 0.69 0.65  CALCIUM 9.2 8.7* 8.8*    CBC: Recent Labs  Lab 08/01/22 1026 08/02/22 0420 08/03/22 0423  WBC 9.7 9.7 10.5  NEUTROABS 8.4*  --   --   HGB 12.1* 11.7* 12.0*  HCT 37.5* 35.0* 36.6*  MCV 96.9 95.6 95.8  PLT 177 168 164    LFT Recent Labs  Lab 08/01/22 1026 08/03/22 0423  AST 54* 75*  ALT 25 39  ALKPHOS 55 53  BILITOT 1.6* 1.6*  PROT 6.6 6.1*  ALBUMIN 4.0 3.7     Antibiotics: Anti-infectives (From admission, onward)    Start     Dose/Rate Route Frequency Ordered Stop   08/02/22 1000  cefTRIAXone (ROCEPHIN) 1 g in sodium chloride 0.9 % 100 mL IVPB        1 g 200 mL/hr over 30 Minutes Intravenous Every 24 hours 08/01/22 1629 08/06/22  1610   08/01/22 1445  cefTRIAXone (ROCEPHIN) 1 g in sodium chloride 0.9 % 100 mL IVPB        1 g 200 mL/hr over 30 Minutes Intravenous  Once 08/01/22 1436 08/01/22 1523        DVT prophylaxis: Lovenox  Code Status: Full code  Family Communication:    CONSULTS    Subjective   Patient seen and examined, denies chest pain.   Objective    Physical Examination:  Appears in no acute distress S1-S2, regular, no murmur auscultated Lungs clear to auscultation bilaterally Neuro-alert, oriented to self only   Status is: Inpatient:             Meredeth Ide   [facility] If 7PM-7AM, please contact night-coverage at [email or web address], Office   [phone]   08/03/2022, 2:06 PM  LOS: 1 day

## 2022-08-04 DIAGNOSIS — I214 Non-ST elevation (NSTEMI) myocardial infarction: Secondary | ICD-10-CM | POA: Diagnosis not present

## 2022-08-04 LAB — CK: Total CK: 373 U/L (ref 49–397)

## 2022-08-04 MED ORDER — HALOPERIDOL LACTATE 5 MG/ML IJ SOLN
1.0000 mg | Freq: Once | INTRAMUSCULAR | Status: AC
  Administered 2022-08-05: 1 mg via INTRAVENOUS
  Filled 2022-08-04 (×2): qty 1

## 2022-08-04 MED ORDER — SODIUM CHLORIDE 0.9 % IV SOLN: INTRAVENOUS | Status: DC

## 2022-08-04 NOTE — Progress Notes (Signed)
Occupational Therapy Treatment Patient Details Name: Carlos [name] MRN: [id number] DOB: Aug 01, 1940 Today's Date: 08/04/2022   History of present illness Patient is a 82 year old male who presented on 6/9 after a fall at home with unknown time down.patient was found to have NSTEMI, R clavicular fracture, and rhabdomyolysis. PMH: GERD, osteoarthritis of knee, hyperlipidemia, STEMI, suspected dementia,   OT comments  Pt was noted to be very tangential and with confusion, often referencing things such as going to [employer] and getting to walk there. He was also noted to be with decreased insight into deficits, decreased safety awareness, and intermittent impulsivity. As such, he required frequent redirection to tasks and cues for safety. He was assisted out of bed and to the bedside chair. He was noted to be with noted unsteadiness in standing, making him a high falls risk at current. He had a RUE sling that was ill positioned, with this OT readjusting it as needed; pt had no knowledge of why he needed a sling. Patient will benefit from continued inpatient follow up therapy, <3 hours/day. Continue OT plan of care.    Recommendations for follow up therapy are one component of a multi-disciplinary discharge planning process, led by the attending physician.  Recommendations may be updated based on patient status, additional functional criteria and insurance authorization.    Assistance Recommended at Discharge Frequent or constant Supervision/Assistance  Patient can return home with the following  A lot of help with bathing/dressing/bathroom;Assistance with cooking/housework;Direct supervision/assist for medications management;Assist for transportation;Help with stairs or ramp for entrance;Direct supervision/assist for financial management;A lot of help with walking and/or transfers   Equipment Recommendations  None recommended by OT       Precautions / Restrictions Precautions Precautions:  Fall Restrictions Other Position/Activity Restrictions: Presumed RUE NWB due to clavicle fracture       Mobility Bed Mobility Overal bed mobility: Needs Assistance Bed Mobility: Supine to Sit     Supine to sit: +2 for physical assistance, +2 for safety/equipment, HOB elevated, Mod assist     General bed mobility comments: pt required increased time and cues for sequencing and attention to task    Transfers Overall transfer level: Needs assistance   Transfers: Sit to/from Stand Sit to Stand: Mod assist, +2 physical assistance           General transfer comment: required steadying assist, as well as cues for safety and sustained attention, due to impulsivity         ADL either performed or assessed with clinical judgement   ADL Overall ADL's : Needs assistance/impaired Eating/Feeding: Set up;Sitting;Supervision/ safety Eating/Feeding Details (indicate cue type and reason): based on clinical judgement             Upper Body Dressing : Moderate assistance;Sitting Upper Body Dressing Details (indicate cue type and reason): he required significant assist to readjust RUE sling, as it was ill fitted and placed Lower Body Dressing: Maximal assistance;Sit to/from stand   Toilet Transfer: BSC/3in1;Rolling walker (2 wheels);Maximal assistance;Cueing for safety;Cueing for sequencing                    Cognition Arousal/Alertness: Awake/alert Behavior During Therapy: Impulsive Overall Cognitive Status: No family/caregiver present to determine baseline cognitive functioning Area of Impairment: Attention, Safety/judgement, Awareness, Memory                              Pertinent Vitals/ Pain  Pain Assessment Pain Assessment: No/denies pain         Frequency  Min 1X/week        Progress Toward Goals  OT Goals(current goals can now be found in the care plan section)     Acute Rehab OT Goals OT Goal Formulation: Patient unable to  participate in goal setting Time For Goal Achievement: 08/16/22 Potential to Achieve Goals: Fair  Plan Discharge plan remains appropriate       AM-PAC OT "6 Clicks" Daily Activity     Outcome Measure   Help from another person eating meals?: A Little Help from another person taking care of personal grooming?: A Little Help from another person toileting, which includes using toliet, bedpan, or urinal?: A Lot Help from another person bathing (including washing, rinsing, drying)?: A Lot Help from another person to put on and taking off regular upper body clothing?: A Lot Help from another person to put on and taking off regular lower body clothing?: A Lot 6 Click Score: 14    End of Session Equipment Utilized During Treatment: Gait belt  OT Visit Diagnosis: Unsteadiness on feet (R26.81);History of falling (Z91.81);Muscle weakness (generalized) (M62.81)   Activity Tolerance Other (comment) (Fair tolerance. Limited by confusion and decreased safety awareness)   Patient Left in chair;with call bell/phone within reach;with nursing/sitter in room;with chair alarm set   Nurse Communication Mobility status        Time: 1610-9604 OT Time Calculation (min): 18 min  Charges: OT General Charges $OT Visit: 1 Visit OT Treatments $Therapeutic Activity: 8-22 mins     [name], OTR/L 08/04/2022, 5:03 PM

## 2022-08-04 NOTE — Progress Notes (Signed)
SLP Cancellation Note  Patient Details Name: Carlos [name] MRN: [id number] DOB: July 30, 1940   Cancelled treatment:        Pt now on comfort diet per GOC meeting, Thank you.  SLP will follow up to initiate exercises for pt to maximize his swallow rehab and airway protection.     Chales Abrahams 08/04/2022, 3:38 PM

## 2022-08-04 NOTE — Consult Note (Signed)
Consultation Note Date: 08/04/2022   Patient Name: Carlos [name]  DOB: [birth year]  MRN: [id number]  Age / Sex: 82 y.o., male  PCP: Carlos Floro, MD Referring Physician: Lorin Glass, MD  Reason for Consultation: Establishing goals of care  HPI/Patient Profile: 82 y.o. male     admitted on 08/01/2022    Clinical Assessment and Goals of Care: 59 year old gentleman who lives at home with girlfriend, past medical history of hypertension dyslipidemia coronary artery disease GERD, osteoarthritis, impaired mobility.  Brought into the emergency department, found to have a nondisplaced fracture proximal right clavicular head.  Was seen and evaluated by orthopedics.  Also seen and evaluated by cardiology because of abnormal EKG. Admitted to hospital medicine service. Safety sitter at bedside. Palliative consult for goals of care discussions has been requested. Chart reviewed, palliative consult request received, SLP notes reviewed and also discussed with SLP colleague. Palliative medicine is specialized medical care for people living with serious illness. It focuses on providing relief from the symptoms and stress of a serious illness. The goal is to improve quality of life for both the patient and the family. Goals of care: Broad aims of medical therapy in relation to the patient's values and preferences. Our aim is to provide medical care aimed at enabling patients to achieve the goals that matter most to them, given the circumstances of their particular medical situation and their constraints.  Call placed and I was able to reach patient's niece Carlos [name]. Discussions below. NEXT OF KIN  Niece and nephews.  Does not have a spouse does not have children. SUMMARY OF RECOMMENDATIONS   Goals of care discussions undertaken with patient's niece Carlos [name]: Considering DNR/DNI. Not in favor of artificial nutrition  temporary or permanent.  Consider careful hand assisted supervised comfort feeds recognizing aspiration risk. Skilled nursing facility with palliative services upon discharge: Family chooses either [place] [facility]. Spiritual care consult to see if it is possible to complete advance care planning documents while the patient is in the hospital. Thank you for the consult.  Code Status/Advance Care Planning: Full code Considering DNR DNI.   Symptom Management:     Palliative Prophylaxis:  Delirium Protocol  Additional Recommendations (Limitations, Scope, Preferences): No Artificial Feeding  Psycho-social/Spiritual:  Desire for further Chaplaincy support:yes Additional Recommendations: Education on Hospice  Prognosis:  Unable to determine  Discharge Planning: Skilled Nursing Facility for rehab with Palliative care service follow-up      Primary Diagnoses: Present on Admission:  NSTEMI (non-ST elevated myocardial infarction) (HCC)  Hyperlipidemia  CAD (coronary artery disease)  Rhabdomyolysis  Dementia without behavioral disturbance (HCC)   I have reviewed the medical record, interviewed the patient and family, and examined the patient. The following aspects are pertinent.  Past Medical History:  Diagnosis Date   Amnesia 09/29/2021   Angina pectoris (HCC)    CAD (coronary artery disease) 05/14/2013   GERD (gastroesophageal reflux disease)    Hypercholesteremia    Hyperlipidemia 05/14/2013   Osteoarthritis of knee 09/29/2021   Other  long term (current) drug therapy 09/29/2021   Poor balance 09/29/2021   Screening for malignant neoplasm of prostate 09/29/2021   ST elevation myocardial infarction (STEMI) of lateral wall, initial episode of care [facility]) 05/12/2013   Social History   Socioeconomic History   Marital status: Single    Spouse name: Not on file   Number of children: Not on file   Years of education: Not on file   Highest education level: Not on file   Occupational History   Not on file  Tobacco Use   Smoking status: Former   Smokeless tobacco: Former  Building services engineer Use: Never used  Substance and Sexual Activity   Alcohol use: No   Drug use: No   Sexual activity: Not on file  Other Topics Concern   Not on file  Social History Narrative   Not on file   Social Determinants of Health   Financial Resource Strain: Not on file  Food Insecurity: No Food Insecurity (08/01/2022)   Hunger Vital Sign    Worried About Running Out of Food in the Last Year: Never true    Ran Out of Food in the Last Year: Never true  Transportation Needs: Patient Unable To Answer (08/01/2022)   PRAPARE - Administrator, Civil Service (Medical): Patient unable to answer    Lack of Transportation (Non-Medical): Patient unable to answer  Physical Activity: Not on file  Stress: Not on file  Social Connections: Not on file   Family History  Problem Relation Age of Onset   Alzheimer's disease Mother    CAD Brother    CAD Brother    Scheduled Meds:  clopidogrel  75 mg Oral Daily   docusate sodium  100 mg Oral BID   enoxaparin (LOVENOX) injection  40 mg Subcutaneous Q24H   haloperidol lactate  1 mg Intravenous Once   metoprolol tartrate  12.5 mg Oral BID   Continuous Infusions:  sodium chloride 75 mL/hr at 08/04/22 1325   doxycycline (VIBRAMYCIN) IV 100 mg (08/04/22 0247)   PRN Meds:.acetaminophen **OR** acetaminophen, albuterol, ondansetron **OR** ondansetron (ZOFRAN) IV, polyethylene glycol Medications Prior to Admission:  Prior to Admission medications   Medication Sig Start Date End Date Taking? Authorizing Provider  clopidogrel (PLAVIX) 75 MG tablet Take 1 tablet (75 mg total) by mouth daily. 09/30/21  Yes Baldo Daub, MD  simvastatin (ZOCOR) 40 MG tablet Take 40 mg by mouth every evening.   Yes [provider]  b complex vitamins tablet Take 0.5 tablets by mouth once a week.    [provider]  Calcium  Carbonate Antacid 400 MG CHEW Chew 800 mg by mouth as needed (heart burn).    [provider]  GLUCOSAMINE-CHONDROITIN PO Take 1 tablet by mouth daily.    [provider]  metoprolol succinate (TOPROL-XL) 25 MG 24 hr tablet Take 1 tablet (25 mg total) by mouth daily. 09/30/21   Baldo Daub, MD  Multiple Vitamins-Minerals (MULTIVITAMIN WITH MINERALS) tablet Take 0.5 tablets by mouth daily.     [provider]  nitroGLYCERIN (NITROSTAT) 0.4 MG SL tablet Place 1 tablet (0.4 mg total) under the tongue every 5 (five) minutes x 3 doses as needed for chest pain. 09/30/21   Baldo Daub, MD  Omega-3 Fatty Acids (FISH OIL PO) Take 1 capsule by mouth daily.    [provider]  vitamin E 180 MG (400 UNITS) capsule Take 400 Units by mouth daily.  [provider]   No Known Allergies Review of Systems confusion Physical Exam Elderly gentleman resting in bed patient was asleep earlier in the morning, towards the afternoon more awake alert and interactive Regular work of breathing No edema Flat affect Verbalizes some Regular  Vital Signs: BP 104/66 (BP Location: Right Arm)   Pulse 61   Temp 97.7 F (36.5 C) (Oral)   Resp 16   Ht 5\' 11"  (1.803 m)   Wt 63.8 kg   SpO2 100%   BMI 19.62 kg/m  Pain Scale: PAINAD   Pain Score: 0-No pain   SpO2: SpO2: 100 % O2 Device:SpO2: 100 % O2 Flow Rate: .   IO: Intake/output summary:  Intake/Output Summary (Last 24 hours) at 08/04/2022 1528 Last data filed at 08/04/2022 1139 Gross per 24 hour  Intake 0 ml  Output 650 ml  Net -650 ml    LBM: Last BM Date : 08/01/22 Baseline Weight: Weight: 63.8 kg Most recent weight: Weight: 63.8 kg     Palliative Assessment/Data:   PPS 50%  Time In:  1430 Time Out:  1530 Time Total:  60  Greater than 50%  of this time was spent counseling and coordinating care related to the above assessment and plan.  Signed by: Rosalin Hawking, MD   Please contact Palliative  Medicine Team phone at [phone] for questions and concerns.  For individual provider: See Loretha Stapler

## 2022-08-04 NOTE — Plan of Care (Signed)
  Problem: Education: Goal: Knowledge of General Education information will improve Description: Including pain rating scale, medication(s)/side effects and non-pharmacologic comfort measures 08/04/2022 1950 by Val Eagle, RN Outcome: Not Progressing 08/04/2022 1940 by Val Eagle, RN Outcome: Not Progressing   Problem: Health Behavior/Discharge Planning: Goal: Ability to manage health-related needs will improve 08/04/2022 1950 by Val Eagle, RN Outcome: Not Progressing 08/04/2022 1940 by Val Eagle, RN Outcome: Not Progressing

## 2022-08-04 NOTE — TOC Progression Note (Signed)
Transition of Care [facility]) - Progression Note    Patient Details  Name: SOL ENGLERT MRN: [id number] Date of Birth: 1940-12-24  [facility]) CM/SW Contact  Larrie Kass, LCSW Phone Number: 08/04/2022, 4:39 PM  Clinical Narrative:    CSW received a message from the palliative provider, that family has chosen two different facilities for placement.  CSW attempted to talk with pt's Niece [name], no answer, Left HIPAA complaint VM requested. CSW spoke with pt's Gaynell Face and informed him the family would have to choose one facility for their uncle and CSW cannot make this decision. Pt's Nephew reports he will speak with his family and make a decision. TOC to follow.   Expected Discharge Plan:  (TBD) Barriers to Discharge: Continued Medical Work up  Expected Discharge Plan and Services                                               Social Determinants of Health (SDOH) Interventions SDOH Screenings   Food Insecurity: No Food Insecurity (08/01/2022)  Housing: Low Risk  (08/01/2022)  Transportation Needs: Patient Unable To Answer (08/01/2022)  Utilities: Not At Risk (08/01/2022)  Tobacco Use: Medium Risk (08/03/2022)    Readmission Risk Interventions     No data to display

## 2022-08-04 NOTE — Plan of Care (Signed)
  Problem: Education: Goal: Knowledge of General Education information will improve Description: Including pain rating scale, medication(s)/side effects and non-pharmacologic comfort measures Outcome: Not Progressing   Problem: Health Behavior/Discharge Planning: Goal: Ability to manage health-related needs will improve Outcome: Not Progressing   
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
## 2022-08-05 DIAGNOSIS — I214 Non-ST elevation (NSTEMI) myocardial infarction: Secondary | ICD-10-CM | POA: Diagnosis not present

## 2022-08-05 MED ORDER — HALOPERIDOL LACTATE 5 MG/ML IJ SOLN
1.0000 mg | Freq: Four times a day (QID) | INTRAMUSCULAR | Status: DC | PRN
  Administered 2022-08-06: 1 mg via INTRAVENOUS
  Filled 2022-08-05 (×2): qty 1

## 2022-08-05 MED ORDER — DOXYCYCLINE HYCLATE 100 MG PO TABS
100.0000 mg | ORAL_TABLET | Freq: Two times a day (BID) | ORAL | Status: DC
  Administered 2022-08-05 – 2022-08-06 (×2): 100 mg via ORAL
  Filled 2022-08-05 (×2): qty 1

## 2022-08-05 NOTE — Care Management Important Message (Signed)
Important Message  Patient Details IM Letter given. Name: Carlos [name] MRN: [id number] Date of Birth: 20-Jun-1940   Medicare Important Message Given:  Yes     Caren Macadam 08/05/2022, 1:48 PM

## 2022-08-05 NOTE — Plan of Care (Signed)
  Problem: Education: Goal: Knowledge of General Education information will improve Description: Including pain rating scale, medication(s)/side effects and non-pharmacologic comfort measures Outcome: Not Progressing   Problem: Health Behavior/Discharge Planning: Goal: Ability to manage health-related needs will improve Outcome: Not Progressing   
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
## 2022-08-05 NOTE — TOC Progression Note (Signed)
Transition of Care [facility]) - Progression Note    Patient Details  Name: Carlos [name] MRN: [id number] Date of Birth: December 11, 1940  Transition of Care [facility]) CM/SW Contact  Larrie Kass, LCSW Phone Number: 08/05/2022, 12:27 PM  Clinical Narrative:    CSW spoke with pt's Niece Carlos [name], she has chosen [facility]. CSW to start insurance authorization. CSW has discussed OP Palliative services with pt's Niece and informed her the facility will help arrange this, as they contract with a specific agency. CSW spoke to Blain with [facility], she reports they will have a bed tomorrow. Insurance auth pending. TOC to follow     Expected Discharge Plan:  (TBD) Barriers to Discharge: Continued Medical Work up  Expected Discharge Plan and Services                                               Social Determinants of Health (SDOH) Interventions SDOH Screenings   Food Insecurity: No Food Insecurity (08/01/2022)  Housing: Low Risk  (08/01/2022)  Transportation Needs: Patient Unable To Answer (08/01/2022)  Utilities: Not At Risk (08/01/2022)  Tobacco Use: Medium Risk (08/03/2022)    Readmission Risk Interventions     No data to display

## 2022-08-05 NOTE — Progress Notes (Signed)
Physical Therapy Treatment Patient Details Name: Carlos [name] MRN: [id number] DOB: 08-07-1940 Today's Date: 08/05/2022   History of Present Illness Patient is a 82 year old male who presented on 6/9 after a fall at home with unknown time down.patient was found to have NSTEMI, R clavicular fracture, and rhabdomyolysis. PMH: GERD, osteoarthritis of knee, hyperlipidemia, STEMI, suspected dementia,    PT Comments    Pt is making progress toward goals this session. Fatigues easily,  however is able to amb short distance into hallway. Requires frequent redirection but  is cooperative  throughout PT session. Pt has sitter and family member [name] his niece) in room. D/c plan remains appropriate.   Recommendations for follow up therapy are one component of a multi-disciplinary discharge planning process, led by the attending physician.  Recommendations may be updated based on patient status, additional functional criteria and insurance authorization.  Follow Up Recommendations  Can patient physically be transported by private vehicle: No    Assistance Recommended at Discharge Frequent or constant Supervision/Assistance  Patient can return home with the following Two people to help with walking and/or transfers;Two people to help with bathing/dressing/bathroom;Assistance with cooking/housework;Assist for transportation;Direct supervision/assist for medications management;Direct supervision/assist for financial management;Help with stairs or ramp for entrance   Equipment Recommendations  None recommended by PT    Recommendations for Other Services       Precautions / Restrictions Precautions Precautions: Fall Precaution Comments: fell just prior to this admission, pt poor historian so not able to provide fall history Restrictions Weight Bearing Restrictions: No Other Position/Activity Restrictions: Presumed RUE NWB due to clavicle fracture; sling placed for PT session     Mobility  Bed  Mobility               General bed mobility comments: pt received in recliner and returned to same    Transfers Overall transfer level: Needs assistance Equipment used: Rolling walker (2 wheels) Transfers: Sit to/from Stand Sit to Stand: Min assist, +2 physical assistance, +2 safety/equipment           General transfer comment: assist to rise, transition to RW and prevent posterior LOB. cues for trunk, hip and knee extension    Ambulation/Gait Ambulation/Gait assistance: Min assist, +2 safety/equipment, Mod assist Gait Distance (Feet): 7 Feet Assistive device: Rolling walker (2 wheels) Gait Pattern/deviations: Step-to pattern, Decreased step length - right, Decreased step length - left, Trunk flexed, Knee flexed in stance - right, Knee flexed in stance - left, Decreased dorsiflexion - right, Decreased dorsiflexion - left       General Gait Details: multi-modal cues for posture, incr step length and proximity to RW. assist for balance and RW position; +2 for safety and chair follow   Stairs             Wheelchair Mobility    Modified Rankin (Stroke Patients Only)       Balance                                            Cognition Arousal/Alertness: Awake/alert Behavior During Therapy: Restless Overall Cognitive Status: History of cognitive impairments - at baseline Area of Impairment: Attention, Safety/judgement, Awareness, Memory, Problem solving                   Current Attention Level: Focused Memory: Decreased short-term memory   Safety/Judgement: Decreased awareness of safety,  Decreased awareness of deficits   Problem Solving: Difficulty sequencing, Requires verbal cues General Comments: tangential at times, requries frequent redirection        Exercises General Exercises - Lower Extremity Ankle Circles/Pumps: AROM, Both, 5 reps Long Arc Quad: AROM, Both, Seated, 5 reps    General Comments        Pertinent  Vitals/Pain Pain Assessment Pain Assessment: No/denies pain    Home Living                          Prior Function            PT Goals (current goals can now be found in the care plan section) Acute Rehab PT Goals PT Goal Formulation: Patient unable to participate in goal setting Time For Goal Achievement: 08/16/22 Potential to Achieve Goals: Fair Progress towards PT goals: Progressing toward goals    Frequency    Min 1X/week      PT Plan Current plan remains appropriate    Co-evaluation              AM-PAC PT "6 Clicks" Mobility   Outcome Measure  Help needed turning from your back to your side while in a flat bed without using bedrails?: A Little Help needed moving from lying on your back to sitting on the side of a flat bed without using bedrails?: A Lot Help needed moving to and from a bed to a chair (including a wheelchair)?: A Lot Help needed standing up from a chair using your arms (e.g., wheelchair or bedside chair)?: A Lot Help needed to walk in hospital room?: A Lot Help needed climbing 3-5 steps with a railing? : Total 6 Click Score: 12    End of Session Equipment Utilized During Treatment: Gait belt Activity Tolerance: Patient tolerated treatment well Patient left: in chair;with call bell/phone within reach;with chair alarm set;with family/visitor present;with nursing/sitter in room Nurse Communication: Mobility status PT Visit Diagnosis: Unsteadiness on feet (R26.81);History of falling (Z91.81);Muscle weakness (generalized) (M62.81)     Time: 1610-9604 PT Time Calculation (min) (ACUTE ONLY): 23 min  Charges:  $Gait Training: 23-37 mins                     Tong Pieczynski, PT  Acute Rehab Dept [facility]) [phone]  08/05/2022    [facility] 08/05/2022, 3:47 PM

## 2022-08-05 NOTE — Progress Notes (Signed)
Initial Nutrition Assessment  DOCUMENTATION CODES:   Not applicable  INTERVENTION:  - DYS 1 diet for comfort feeds as medically appropriate.  - Family does not want artifical nutrition.    NUTRITION DIAGNOSIS:   Increased nutrient needs related to acute illness as evidenced by estimated needs.  GOAL:   Patient will meet greater than or equal to 90% of their needs  MONITOR:   PO intake, Weight trends  REASON FOR ASSESSMENT:   Consult Assessment of nutrition requirement/status  ASSESSMENT:   82 y.o. male with PMH significant for HTN, HLD, CAD/STEMI 2015, GERD osteoarthritis who presented after being found by family member on the floor. Admitted for rhabdomyolysis and NSTEMI   Patient sleeping at time of visit, did not awake to sound of voice. No family at bedside.  Per EMR, no significant changes in weight since August.  Patient initially on a regular diet but had SLP eval 6/11 and recommended NPO. Palliative care consulted and family made decision for no artifical nutrition (TF), temporary or permanent. Patient then ordered a DYS 1 diet yesterday for comfort feeds.  Sitter at bedside reports patient ate 100% of his dinner last night, no noted signs of intolerance. Did not eat breakfast this morning due to being asleep. Was documented to have had 100% of lunch today.  Palliative care continues to follow patient. He remains full code at this time.    Medications reviewed and include: Colace  Labs reviewed:  -   NUTRITION - FOCUSED PHYSICAL EXAM:  Unable to complete  Diet Order:   Diet Order             DIET - DYS 1 Room service appropriate? Yes; Fluid consistency: Thin  Diet effective now                   EDUCATION NEEDS:  Not appropriate for education at this time  Skin:  Skin Assessment: Reviewed RN Assessment  Last BM:  6/9  Height:  Ht Readings from Last 1 Encounters:  08/01/22 5\' 11"  (1.803 m)   Weight:  Wt Readings from Last 1  Encounters:  08/01/22 63.8 kg    BMI:  Body mass index is 19.62 kg/m.  Estimated Nutritional Needs:  Kcal:  1800-2000 kcals Protein:  75-90 grams Fluid:  >/= 1.8L    Shelle Iron RD, LDN For contact information, refer to [facility].

## 2022-08-05 NOTE — Progress Notes (Signed)
Daily Progress Note   Patient Name: Carlos [name]       Date: 08/05/2022 DOB: 1940/08/25  Age: 82 y.o. MRN#: [id number] Attending Physician: Lorin Glass, MD Primary Care Physician: Daisy Floro, MD Admit Date: 08/01/2022  Reason for Consultation/Follow-up: Establishing goals of care  Subjective:    Length of Stay: 3  Current Medications: Scheduled Meds:   clopidogrel  75 mg Oral Daily   docusate sodium  100 mg Oral BID   doxycycline  100 mg Oral Q12H   enoxaparin (LOVENOX) injection  40 mg Subcutaneous Q24H   metoprolol tartrate  12.5 mg Oral BID    Continuous Infusions:  sodium chloride 75 mL/hr at 08/05/22 0113    PRN Meds: acetaminophen **OR** acetaminophen, albuterol, ondansetron **OR** ondansetron (ZOFRAN) IV, polyethylene glycol  Physical Exam          Vital Signs: BP 120/69 (BP Location: Right Arm)   Pulse 65   Temp 98.1 F (36.7 C) (Oral)   Resp 16   Ht 5\' 11"  (1.803 m)   Wt 63.8 kg   SpO2 100%   BMI 19.62 kg/m  SpO2: SpO2: 100 % O2 Device: O2 Device: Room Air O2 Flow Rate:    Intake/output summary:  Intake/Output Summary (Last 24 hours) at 08/05/2022 1347 Last data filed at 08/05/2022 1249 Gross per 24 hour  Intake 2493.84 ml  Output 800 ml  Net 1693.84 ml   LBM: Last BM Date : 08/01/22 Baseline Weight: Weight: 63.8 kg Most recent weight: Weight: 63.8 kg       Palliative Assessment/Data:      Patient Active Problem List   Diagnosis Date Noted   NSTEMI (non-ST elevated myocardial infarction) (HCC) 08/01/2022   Rhabdomyolysis 08/01/2022   Fall at home, initial encounter 08/01/2022   Dementia without behavioral disturbance (HCC) 08/01/2022   Amnesia 09/29/2021   Osteoarthritis of knee 09/29/2021   Other long term (current) drug  therapy 09/29/2021   Poor balance 09/29/2021   Screening for malignant neoplasm of prostate 09/29/2021   Hypercholesteremia    GERD (gastroesophageal reflux disease)    Angina pectoris (HCC)    Hyperlipidemia 05/14/2013   CAD (coronary artery disease) 05/14/2013   ST elevation myocardial infarction (STEMI) of lateral wall, initial episode of care [facility]) 05/12/2013    Palliative Care Assessment & Plan  Patient Profile:    Assessment:  17 year old gentleman who lives at home with girlfriend, past medical history of hypertension dyslipidemia coronary artery disease GERD, osteoarthritis, impaired mobility.  Brought into the emergency department, found to have a nondisplaced fracture proximal right clavicular head.  Was seen and evaluated by orthopedics.  Also seen and evaluated by cardiology because of abnormal EKG. Dementia, dysphagia, now with comfort feeds, also with staph epidermidis UTI.   Recommendations/Plan:  SNF rehab [facility] with palliative, full code for now. Discussed with niece [name] at bedside.   Goals of Care and Additional Recommendations: Limitations on Scope of Treatment: Full Scope Treatment  Code Status:    Code Status Orders  (From admission, onward)           Start     Ordered   08/01/22 1623  Full code  Continuous       Question:  By:  Answer:  Default: patient does not have capacity for decision making, no surrogate or prior directive available   08/01/22 1624           Code Status History     Date Active Date Inactive Code Status Order ID Comments User Context   05/12/2013 1300 05/15/2013 1554 Full Code [id number]  [name], MD Inpatient       Prognosis:  Unable to determine  Discharge Planning: Skilled Nursing Facility for rehab with Palliative care service follow-up  Care plan was discussed with  patient and niece [name].   Thank you for allowing the Palliative Medicine Team to assist in the care of this patient. Mod MDM.   Greater than 50%  of this time was spent counseling and coordinating care related to the above assessment and plan.  Rosalin Hawking, MD  Please contact Palliative Medicine Team phone at [phone] for questions and concerns.

## 2022-08-05 NOTE — Progress Notes (Signed)
Occupational Therapy Treatment Patient Details Name: Carlos [name] MRN: [id number] DOB: 08/13/1940 Today's Date: 08/05/2022   History of present illness Patient is a 82 year old male who presented on 6/9 after a fall at home with unknown time down.patient was found to have NSTEMI, R clavicular fracture, and rhabdomyolysis. PMH: GERD, osteoarthritis of knee, hyperlipidemia, STEMI, suspected dementia,   OT comments  Pt was assisted with bed mobility, functional transfers, and ADLs at chair level. He required steadying assist in standing, as well as general cues for safety awareness during out of bed activity. He performed upper body grooming seated in the chair, requiring min verbal cues for initiation. He continues to requires increased assist for lower body dressing, as well as cues for memory/recall. Without further OT services, he is at risk for falls and restricted ADL participation. Continue OT plan of care.    Recommendations for follow up therapy are one component of a multi-disciplinary discharge planning process, led by the attending physician.  Recommendations may be updated based on patient status, additional functional criteria and insurance authorization.    Assistance Recommended at Discharge Frequent or constant Supervision/Assistance  Patient can return home with the following  A lot of help with bathing/dressing/bathroom;Assistance with cooking/housework;Direct supervision/assist for medications management;Assist for transportation;Help with stairs or ramp for entrance;Direct supervision/assist for financial management;A lot of help with walking and/or transfers   Equipment Recommendations  None recommended by OT       Precautions / Restrictions Precautions Precautions: Fall Precaution Comments: fell just prior to this admission, pt poor historian so not able to provide fall history Restrictions Weight Bearing Restrictions: No Other Position/Activity Restrictions: Presumed  RUE NWB due to clavicle fracture; sling placed for PT session       Mobility Bed Mobility Overal bed mobility: Needs Assistance Bed Mobility: Supine to Sit     Supine to sit: Mod assist          Transfers Overall transfer level: Needs assistance Equipment used: Rolling walker (2 wheels) Transfers: Sit to/from Stand, Bed to chair/wheelchair/BSC Sit to Stand: From elevated surface           General transfer comment: he required cues for general safety awareness and safe transfer technique         ADL either performed or assessed with clinical judgement   ADL Overall ADL's : Needs assistance/impaired Eating/Feeding: Set up;Sitting;Supervision/ safety Eating/Feeding Details (indicate cue type and reason): based on clinical judgement Grooming: Supervision/safety;Set up;Sitting Grooming Details (indicate cue type and reason): He performed face and hand washing seated in the bedside chair. He needed min verbal cues for initiation of tasks.             Lower Body Dressing: Maximal assistance Lower Body Dressing Details (indicate cue type and reason): He required increased assist to doff then donn his socks in sitting. Toilet Transfer: Rolling walker (2 wheels);Cueing for safety;Cueing for sequencing;Moderate assistance;Regular Toilet                    Cognition Arousal/Alertness: Awake/alert   Overall Cognitive Status: History of cognitive impairments - at baseline Area of Impairment: Attention, Safety/judgement, Awareness, Memory, Problem solving          General Comments: tangential at times, requries frequent redirection; pt's niece was present and stated pt's cognition is a little worse than typical for him                   Pertinent Vitals/ Pain  Pain Assessment Pain Assessment: No/denies pain         Frequency  Min 1X/week        Progress Toward Goals  OT Goals(current goals can now be found in the care plan section)   Progress towards OT goals: Progressing toward goals  Acute Rehab OT Goals OT Goal Formulation: Patient unable to participate in goal setting Time For Goal Achievement: 08/16/22 Potential to Achieve Goals: Fair  Plan Discharge plan remains appropriate       AM-PAC OT "6 Clicks" Daily Activity     Outcome Measure   Help from another person eating meals?: A Little Help from another person taking care of personal grooming?: A Little Help from another person toileting, which includes using toliet, bedpan, or urinal?: A Lot Help from another person bathing (including washing, rinsing, drying)?: A Lot Help from another person to put on and taking off regular upper body clothing?: A Lot Help from another person to put on and taking off regular lower body clothing?: A Lot 6 Click Score: 14    End of Session Equipment Utilized During Treatment: Gait belt;Rolling walker (2 wheels)  OT Visit Diagnosis: Unsteadiness on feet (R26.81);History of falling (Z91.81);Muscle weakness (generalized) (M62.81)   Activity Tolerance Patient tolerated treatment well   Patient Left in chair;with call bell/phone within reach;with chair alarm set   Nurse Communication Mobility status        Time: 1610-9604 OT Time Calculation (min): 18 min  Charges: OT General Charges $OT Visit: 1 Visit OT Treatments $Self Care/Home Management : 8-22 mins    [name], OTR/L 08/05/2022, 5:35 PM

## 2022-08-06 DIAGNOSIS — G501 Atypical facial pain: Secondary | ICD-10-CM | POA: Diagnosis not present

## 2022-08-06 DIAGNOSIS — W06XXXA Fall from bed, initial encounter: Secondary | ICD-10-CM | POA: Diagnosis not present

## 2022-08-06 DIAGNOSIS — S42021K Displaced fracture of shaft of right clavicle, subsequent encounter for fracture with nonunion: Secondary | ICD-10-CM | POA: Diagnosis not present

## 2022-08-06 DIAGNOSIS — I213 ST elevation (STEMI) myocardial infarction of unspecified site: Secondary | ICD-10-CM | POA: Diagnosis not present

## 2022-08-06 DIAGNOSIS — W19XXXA Unspecified fall, initial encounter: Secondary | ICD-10-CM | POA: Diagnosis not present

## 2022-08-06 DIAGNOSIS — I2510 Atherosclerotic heart disease of native coronary artery without angina pectoris: Secondary | ICD-10-CM | POA: Diagnosis not present

## 2022-08-06 DIAGNOSIS — Z79899 Other long term (current) drug therapy: Secondary | ICD-10-CM | POA: Diagnosis not present

## 2022-08-06 DIAGNOSIS — Z7401 Bed confinement status: Secondary | ICD-10-CM | POA: Diagnosis not present

## 2022-08-06 DIAGNOSIS — W109XXA Fall (on) (from) unspecified stairs and steps, initial encounter: Secondary | ICD-10-CM | POA: Diagnosis not present

## 2022-08-06 DIAGNOSIS — Z23 Encounter for immunization: Secondary | ICD-10-CM | POA: Diagnosis not present

## 2022-08-06 DIAGNOSIS — S01411A Laceration without foreign body of right cheek and temporomandibular area, initial encounter: Secondary | ICD-10-CM | POA: Diagnosis not present

## 2022-08-06 DIAGNOSIS — I214 Non-ST elevation (NSTEMI) myocardial infarction: Secondary | ICD-10-CM | POA: Diagnosis not present

## 2022-08-06 DIAGNOSIS — M25531 Pain in right wrist: Secondary | ICD-10-CM | POA: Diagnosis not present

## 2022-08-06 DIAGNOSIS — K219 Gastro-esophageal reflux disease without esophagitis: Secondary | ICD-10-CM | POA: Diagnosis not present

## 2022-08-06 DIAGNOSIS — S0990XA Unspecified injury of head, initial encounter: Secondary | ICD-10-CM | POA: Diagnosis not present

## 2022-08-06 DIAGNOSIS — R296 Repeated falls: Secondary | ICD-10-CM | POA: Diagnosis not present

## 2022-08-06 DIAGNOSIS — F0390 Unspecified dementia without behavioral disturbance: Secondary | ICD-10-CM | POA: Diagnosis not present

## 2022-08-06 DIAGNOSIS — I201 Angina pectoris with documented spasm: Secondary | ICD-10-CM | POA: Diagnosis not present

## 2022-08-06 DIAGNOSIS — M6282 Rhabdomyolysis: Secondary | ICD-10-CM | POA: Diagnosis not present

## 2022-08-06 DIAGNOSIS — R41841 Cognitive communication deficit: Secondary | ICD-10-CM | POA: Diagnosis not present

## 2022-08-06 DIAGNOSIS — Z7902 Long term (current) use of antithrombotics/antiplatelets: Secondary | ICD-10-CM | POA: Diagnosis not present

## 2022-08-06 DIAGNOSIS — S0993XA Unspecified injury of face, initial encounter: Secondary | ICD-10-CM | POA: Diagnosis not present

## 2022-08-06 DIAGNOSIS — M179 Osteoarthritis of knee, unspecified: Secondary | ICD-10-CM | POA: Diagnosis not present

## 2022-08-06 DIAGNOSIS — I10 Essential (primary) hypertension: Secondary | ICD-10-CM | POA: Diagnosis not present

## 2022-08-06 DIAGNOSIS — M79642 Pain in left hand: Secondary | ICD-10-CM | POA: Diagnosis not present

## 2022-08-06 DIAGNOSIS — R2689 Other abnormalities of gait and mobility: Secondary | ICD-10-CM | POA: Diagnosis not present

## 2022-08-06 DIAGNOSIS — Z87891 Personal history of nicotine dependence: Secondary | ICD-10-CM | POA: Diagnosis not present

## 2022-08-06 DIAGNOSIS — S42001D Fracture of unspecified part of right clavicle, subsequent encounter for fracture with routine healing: Secondary | ICD-10-CM | POA: Diagnosis not present

## 2022-08-06 DIAGNOSIS — Z9181 History of falling: Secondary | ICD-10-CM | POA: Diagnosis not present

## 2022-08-06 DIAGNOSIS — R1312 Dysphagia, oropharyngeal phase: Secondary | ICD-10-CM | POA: Diagnosis not present

## 2022-08-06 DIAGNOSIS — E785 Hyperlipidemia, unspecified: Secondary | ICD-10-CM | POA: Diagnosis not present

## 2022-08-06 DIAGNOSIS — E559 Vitamin D deficiency, unspecified: Secondary | ICD-10-CM | POA: Diagnosis not present

## 2022-08-06 DIAGNOSIS — M6281 Muscle weakness (generalized): Secondary | ICD-10-CM | POA: Diagnosis not present

## 2022-08-06 DIAGNOSIS — R531 Weakness: Secondary | ICD-10-CM | POA: Diagnosis not present

## 2022-08-06 DIAGNOSIS — Z043 Encounter for examination and observation following other accident: Secondary | ICD-10-CM | POA: Diagnosis not present

## 2022-08-06 DIAGNOSIS — S01112A Laceration without foreign body of left eyelid and periocular area, initial encounter: Secondary | ICD-10-CM | POA: Diagnosis not present

## 2022-08-06 DIAGNOSIS — I200 Unstable angina: Secondary | ICD-10-CM | POA: Diagnosis not present

## 2022-08-06 MED ORDER — QUETIAPINE FUMARATE 25 MG PO TABS: 25.0000 mg | ORAL_TABLET | Freq: Two times a day (BID) | ORAL | Status: AC

## 2022-08-06 MED ORDER — HALOPERIDOL LACTATE 5 MG/ML IJ SOLN
1.0000 mg | Freq: Once | INTRAMUSCULAR | Status: AC
  Administered 2022-08-06: 1 mg via INTRAVENOUS

## 2022-08-06 MED ORDER — DOCUSATE SODIUM 100 MG PO CAPS: 100.0000 mg | ORAL_CAPSULE | Freq: Two times a day (BID) | ORAL | 0 refills | Status: AC

## 2022-08-06 MED ORDER — QUETIAPINE FUMARATE 25 MG PO TABS
25.0000 mg | ORAL_TABLET | Freq: Two times a day (BID) | ORAL | Status: DC
  Administered 2022-08-06: 25 mg via ORAL
  Filled 2022-08-06: qty 1

## 2022-08-06 MED ORDER — POLYETHYLENE GLYCOL 3350 17 G PO PACK: 17.0000 g | PACK | Freq: Every day | ORAL | 0 refills | Status: AC | PRN

## 2022-08-06 NOTE — Progress Notes (Signed)
Carlos [name], at bedside this afternoon. Bruce updated on pt and informed of pending transfer. Bruce to be notified upon transfer. Bruce also made aware of messages being received from Maralyn Sago and then [place] this morning, but RN had not been able to return Sarah's call a that time. Also, not being able to return multiple phone calls to multiple family members during a shift. Bruce verbalized understanding and approval of all. Bruce stated [name] and he had been point of contact for pt updates and prefers to be either of them updated and they can pass info to other family members of their choosing at that time.   1815 Notified of Maralyn Sago being on the phone again for an update. Sarah notifed by Licensed conveyancer upon RN's request to please let Maralyn Sago know Smitty Cords had been by and not long left, please call him.

## 2022-08-06 NOTE — TOC Transition Note (Signed)
Transition of Care [facility]) - CM/SW Discharge Note   Patient Details  Name: Carlos [name] MRN: [id number] Date of Birth: [birth year]  [facility] CM/SW Contact:  Larrie Kass, LCSW Phone Number: 08/06/2022, 11:06 AM   Clinical Narrative:    CSW spoke with pt's Niece [name] to inform her about pt's insurance auth and pt will be d/c today. Pt's Niece is requesting EMS transport. CSW spoke with Lowella Bandy from [facility] , she stated she will call CSW back due to not being in office. TOC to follow   ADDEN Pt to d/c to [facility], RM assignment [phone] call report [phone]. PTAR called no Further TOC needs TOC sign off.     Barriers to Discharge: Continued Medical Work up   Patient Goals and CMS Choice      Discharge Placement                         Discharge Plan and Services Additional resources added to the After Visit Summary for                                       Social Determinants of Health (SDOH) Interventions SDOH Screenings   Food Insecurity: No Food Insecurity (08/01/2022)  Housing: Low Risk  (08/01/2022)  Transportation Needs: Patient Unable To Answer (08/01/2022)  Utilities: Not At Risk (08/01/2022)  Tobacco Use: Medium Risk (08/03/2022)     Readmission Risk Interventions     No data to display
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
## 2022-08-06 NOTE — Progress Notes (Signed)
Patient start agitated, try to get out of the bed.

## 2022-08-06 NOTE — Progress Notes (Signed)
Report called to [facility] LPN at [facility]. Gaynell Face, at bedside and updated. Will notify upon leaving [facility].

## 2022-08-06 NOTE — Progress Notes (Signed)
Carlos [name], nephew, notified via telephone of pt currently being picked up [facility], transferring to [employer].

## 2022-08-06 NOTE — TOC Progression Note (Signed)
Transition of Care [facility]) - Progression Note    Patient Details  Name: Carlos [name] MRN: [id number] Date of Birth: 06-26-1940  Transition of Care [facility]) CM/SW Contact  Larrie Kass, LCSW Phone Number: 08/06/2022, 10:52 AM  Clinical Narrative:     Pt's insurance auth for [employer] was approved. TOC to follow.   Expected Discharge Plan:  (TBD) Barriers to Discharge: Continued Medical Work up  Expected Discharge Plan and Services                                               Social Determinants of Health (SDOH) Interventions SDOH Screenings   Food Insecurity: No Food Insecurity (08/01/2022)  Housing: Low Risk  (08/01/2022)  Transportation Needs: Patient Unable To Answer (08/01/2022)  Utilities: Not At Risk (08/01/2022)  Tobacco Use: Medium Risk (08/03/2022)    Readmission Risk Interventions     No data to display

## 2022-08-09 DIAGNOSIS — M6282 Rhabdomyolysis: Secondary | ICD-10-CM | POA: Diagnosis not present

## 2022-08-09 DIAGNOSIS — F0390 Unspecified dementia without behavioral disturbance: Secondary | ICD-10-CM | POA: Diagnosis not present

## 2022-08-09 DIAGNOSIS — S42001D Fracture of unspecified part of right clavicle, subsequent encounter for fracture with routine healing: Secondary | ICD-10-CM | POA: Diagnosis not present

## 2022-08-09 DIAGNOSIS — S42021K Displaced fracture of shaft of right clavicle, subsequent encounter for fracture with nonunion: Secondary | ICD-10-CM | POA: Diagnosis not present

## 2022-08-09 DIAGNOSIS — R41841 Cognitive communication deficit: Secondary | ICD-10-CM | POA: Diagnosis not present

## 2022-08-09 DIAGNOSIS — M6281 Muscle weakness (generalized): Secondary | ICD-10-CM | POA: Diagnosis not present

## 2022-08-09 DIAGNOSIS — Z9181 History of falling: Secondary | ICD-10-CM | POA: Diagnosis not present

## 2022-08-09 DIAGNOSIS — I214 Non-ST elevation (NSTEMI) myocardial infarction: Secondary | ICD-10-CM | POA: Diagnosis not present

## 2022-08-10 DIAGNOSIS — M6282 Rhabdomyolysis: Secondary | ICD-10-CM | POA: Diagnosis not present

## 2022-08-10 DIAGNOSIS — S42021K Displaced fracture of shaft of right clavicle, subsequent encounter for fracture with nonunion: Secondary | ICD-10-CM | POA: Diagnosis not present

## 2022-08-10 DIAGNOSIS — F0390 Unspecified dementia without behavioral disturbance: Secondary | ICD-10-CM | POA: Diagnosis not present

## 2022-08-10 DIAGNOSIS — I214 Non-ST elevation (NSTEMI) myocardial infarction: Secondary | ICD-10-CM | POA: Diagnosis not present

## 2022-08-11 ENCOUNTER — Other Ambulatory Visit: Payer: Self-pay

## 2022-08-11 ENCOUNTER — Emergency Department (HOSPITAL_COMMUNITY): Payer: Medicare Other

## 2022-08-11 ENCOUNTER — Encounter (HOSPITAL_COMMUNITY): Payer: Self-pay | Admitting: Emergency Medicine

## 2022-08-11 ENCOUNTER — Emergency Department (HOSPITAL_COMMUNITY)
Admission: EM | Admit: 2022-08-11 | Discharge: 2022-08-12 | Disposition: A | Discharge status: Home / Self Care | Payer: Medicare Other | Attending: Student | Admitting: Student

## 2022-08-11 DIAGNOSIS — M79642 Pain in left hand: Secondary | ICD-10-CM | POA: Diagnosis not present

## 2022-08-11 DIAGNOSIS — F0390 Unspecified dementia without behavioral disturbance: Secondary | ICD-10-CM | POA: Diagnosis not present

## 2022-08-11 DIAGNOSIS — S0990XA Unspecified injury of head, initial encounter: Secondary | ICD-10-CM | POA: Diagnosis not present

## 2022-08-11 DIAGNOSIS — I2510 Atherosclerotic heart disease of native coronary artery without angina pectoris: Secondary | ICD-10-CM | POA: Diagnosis not present

## 2022-08-11 DIAGNOSIS — S01411A Laceration without foreign body of right cheek and temporomandibular area, initial encounter: Secondary | ICD-10-CM | POA: Diagnosis not present

## 2022-08-11 DIAGNOSIS — Z043 Encounter for examination and observation following other accident: Secondary | ICD-10-CM | POA: Diagnosis not present

## 2022-08-11 DIAGNOSIS — Z87891 Personal history of nicotine dependence: Secondary | ICD-10-CM | POA: Insufficient documentation

## 2022-08-11 DIAGNOSIS — R296 Repeated falls: Secondary | ICD-10-CM | POA: Diagnosis not present

## 2022-08-11 DIAGNOSIS — Z23 Encounter for immunization: Secondary | ICD-10-CM | POA: Insufficient documentation

## 2022-08-11 DIAGNOSIS — W06XXXA Fall from bed, initial encounter: Secondary | ICD-10-CM | POA: Insufficient documentation

## 2022-08-11 DIAGNOSIS — Z7902 Long term (current) use of antithrombotics/antiplatelets: Secondary | ICD-10-CM | POA: Diagnosis not present

## 2022-08-11 DIAGNOSIS — S01112A Laceration without foreign body of left eyelid and periocular area, initial encounter: Secondary | ICD-10-CM | POA: Diagnosis not present

## 2022-08-11 DIAGNOSIS — W19XXXA Unspecified fall, initial encounter: Secondary | ICD-10-CM

## 2022-08-11 DIAGNOSIS — M25531 Pain in right wrist: Secondary | ICD-10-CM | POA: Diagnosis not present

## 2022-08-11 LAB — URINALYSIS, ROUTINE W REFLEX MICROSCOPIC
Bilirubin Urine: NEGATIVE
Glucose, UA: NEGATIVE mg/dL
Hgb urine dipstick: NEGATIVE
Ketones, ur: NEGATIVE mg/dL
Leukocytes,Ua: NEGATIVE
Nitrite: NEGATIVE
Protein, ur: NEGATIVE mg/dL
Specific Gravity, Urine: 1.006 (ref 1.005–1.030)
pH: 5 (ref 5.0–8.0)

## 2022-08-11 MED ORDER — TETANUS-DIPHTH-ACELL PERTUSSIS 5-2.5-18.5 LF-MCG/0.5 IM SUSY
0.5000 mL | PREFILLED_SYRINGE | Freq: Once | INTRAMUSCULAR | Status: AC
  Administered 2022-08-12: 0.5 mL via INTRAMUSCULAR
  Filled 2022-08-11: qty 0.5

## 2022-08-11 NOTE — ED Notes (Signed)
Patient had an unwitnessed fall. He was found on the floor by a staff member indicating he had to use the bathroom. He is here for an unwitnessed fall. The patient got out of bed from the end due to the rails being up. Patient fell shortly after EMS departed and had not yet been able to get into the room. Patient may have suffered skin tears from the fall, but did not complain of pain. He did not appear to have hit his head. He was placed in the bed, bed alarm placed in the bed and turned on, fall bractlet placed and slip free socks on. He was educated on not getting out of the bed without assistance from staff members.

## 2022-08-11 NOTE — ED Triage Notes (Signed)
Patient presents from [facility] due to an unwitnessed fall tonight which caused tailbone pain. He has suffered multiple falls recently. He complains of tailbone pain post fall and a laceration can be seen to the left cheek and eyebrow.. Today the facility believe he may have been on the floor for about 30 mins. He is not on blood thinners and there was no loss of consciousness.      EMS vitals: 140/82 BP 92 HR 99% SPO2 room air

## 2022-08-11 NOTE — ED Notes (Signed)
   08/11/22 2311  What Happened  Was fall witnessed? No  Was patient injured? Yes  Patient found on floor  Found by Staff-comment  Stated prior activity bathroom-unassisted  Provider Notification  Provider Name/Title Kommor MD  Date Provider Notified 08/11/22  Time Provider Notified 2303  Method of Notification Face-to-face  Notification Reason Fall  Provider response At bedside  Date of Provider Response 08/11/22  Time of Provider Response 2313  Follow Up  Additional tests No  Simple treatment Dressing  Progress note created (see row info) Yes  Blank note created Yes  Adult Fall Risk Assessment  Risk Factor Category (scoring not indicated) High fall risk per protocol (document High fall risk)  Patient Fall Risk Level High fall risk  Adult Fall Risk Interventions  Required Bundle Interventions *See Row Information* High fall risk - low, moderate, and high requirements implemented  Additional Interventions Reorient/diversional activities with confused patients;Use of appropriate toileting equipment (bedpan, BSC, etc.)  Fall intervention(s) refused/Patient educated regarding refusal Bed alarm;Nonskid socks;Open door if unsupervised;Yellow bracelet;Supervision while toileting/edge of bed sitting  Screening for Fall Injury Risk (To be completed on HIGH fall risk patients) - Assessing Need for Floor Mats  Risk For Fall Injury- Criteria for Floor Mats Admitted as a result of a fall  Vitals  Temp 97.9 F (36.6 C)  Temp Source Oral  BP (!) 144/69  MAP (mmHg) 92  Pulse Rate 75  Resp 16  Oxygen Therapy  SpO2 100 %  Pain Assessment  Pain Scale 0-10  Pain Score 0  Neurological  Neuro (WDL) X  Level of Consciousness Alert  Orientation Level Oriented to person  Cognition Follows commands;Poor attention/concentration;Poor judgement;Poor safety awareness  Musculoskeletal  Musculoskeletal (WDL) X  Generalized Weakness Yes  Integumentary  Integumentary (WDL) X  Skin Color  Appropriate for ethnicity  Skin Condition Dry  Skin Integrity Other (Comment) (Lacterations and skin tears)

## 2022-08-12 DIAGNOSIS — Z9181 History of falling: Secondary | ICD-10-CM | POA: Diagnosis not present

## 2022-08-12 DIAGNOSIS — I214 Non-ST elevation (NSTEMI) myocardial infarction: Secondary | ICD-10-CM | POA: Diagnosis not present

## 2022-08-12 DIAGNOSIS — S42021K Displaced fracture of shaft of right clavicle, subsequent encounter for fracture with nonunion: Secondary | ICD-10-CM | POA: Diagnosis not present

## 2022-08-12 DIAGNOSIS — F0390 Unspecified dementia without behavioral disturbance: Secondary | ICD-10-CM | POA: Diagnosis not present

## 2022-08-12 DIAGNOSIS — R41841 Cognitive communication deficit: Secondary | ICD-10-CM | POA: Diagnosis not present

## 2022-08-12 DIAGNOSIS — M6281 Muscle weakness (generalized): Secondary | ICD-10-CM | POA: Diagnosis not present

## 2022-08-12 DIAGNOSIS — M6282 Rhabdomyolysis: Secondary | ICD-10-CM | POA: Diagnosis not present

## 2022-08-12 DIAGNOSIS — S42001D Fracture of unspecified part of right clavicle, subsequent encounter for fracture with routine healing: Secondary | ICD-10-CM | POA: Diagnosis not present

## 2022-08-12 LAB — CBC WITH DIFFERENTIAL/PLATELET
Abs Immature Granulocytes: 0.07 10*3/uL (ref 0.00–0.07)
Basophils Absolute: 0.1 10*3/uL (ref 0.0–0.1)
Basophils Relative: 1 %
Eosinophils Absolute: 0.1 10*3/uL (ref 0.0–0.5)
Eosinophils Relative: 1 %
HCT: 34 % — ABNORMAL LOW (ref 39.0–52.0)
Hemoglobin: 10.7 g/dL — ABNORMAL LOW (ref 13.0–17.0)
Immature Granulocytes: 1 %
Lymphocytes Relative: 10 %
Lymphs Abs: 0.8 10*3/uL (ref 0.7–4.0)
MCH: 30.6 pg (ref 26.0–34.0)
MCHC: 31.5 g/dL (ref 30.0–36.0)
MCV: 97.1 fL (ref 80.0–100.0)
Monocytes Absolute: 0.9 10*3/uL (ref 0.1–1.0)
Monocytes Relative: 11 %
Neutro Abs: 6.2 10*3/uL (ref 1.7–7.7)
Neutrophils Relative %: 76 %
Platelets: 256 10*3/uL (ref 150–400)
RBC: 3.5 MIL/uL — ABNORMAL LOW (ref 4.22–5.81)
RDW: 13.4 % (ref 11.5–15.5)
WBC: 8 10*3/uL (ref 4.0–10.5)
nRBC: 0 % (ref 0.0–0.2)

## 2022-08-12 LAB — COMPREHENSIVE METABOLIC PANEL
ALT: 25 U/L (ref 0–44)
AST: 19 U/L (ref 15–41)
Albumin: 3.2 g/dL — ABNORMAL LOW (ref 3.5–5.0)
Alkaline Phosphatase: 59 U/L (ref 38–126)
Anion gap: 5 (ref 5–15)
BUN: 16 mg/dL (ref 8–23)
CO2: 27 mmol/L (ref 22–32)
Calcium: 9 mg/dL (ref 8.9–10.3)
Chloride: 108 mmol/L (ref 98–111)
Creatinine, Ser: 0.78 mg/dL (ref 0.61–1.24)
GFR, Estimated: >60 mL/min (ref >60)
Glucose, Bld: 100 mg/dL — ABNORMAL HIGH (ref 70–99)
Potassium: 3.8 mmol/L (ref 3.5–5.1)
Sodium: 140 mmol/L (ref 135–145)
Total Bilirubin: 0.8 mg/dL (ref 0.3–1.2)
Total Protein: 5.6 g/dL — ABNORMAL LOW (ref 6.5–8.1)

## 2022-08-12 NOTE — ED Provider Notes (Signed)
[place] EMERGENCY DEPARTMENT AT [facility] Provider Note  CSN: [id number] Arrival date & time: 08/11/22 2242  Chief Complaint(s) Fall  HPI Carlos [name] is a 82 y.o. male with PMH CAD, HTN, HLD who presents to the emergency room for evaluation of a fall.  Patient suffered an unwitnessed fall this evening found in the floor by a staff member.  While here in the emergency room, patient attempted to get out of bed and fell again.  He arrives with skin tears to the still the right, hand on the left as well as a small laceration over the left eyebrow.  No blood thinner use.  Denies chest pain, shortness of breath, abdominal pain, nausea, vomiting or other systemic symptoms.   Past Medical History Past Medical History:  Diagnosis Date   Amnesia 09/29/2021   Angina pectoris (HCC)    CAD (coronary artery disease) 05/14/2013   GERD (gastroesophageal reflux disease)    Hypercholesteremia    Hyperlipidemia 05/14/2013   Osteoarthritis of knee 09/29/2021   Other long term (current) drug therapy 09/29/2021   Poor balance 09/29/2021   Screening for malignant neoplasm of prostate 09/29/2021   ST elevation myocardial infarction (STEMI) of lateral wall, initial episode of care (HCC) 05/12/2013   Patient Active Problem List   Diagnosis Date Noted   NSTEMI (non-ST elevated myocardial infarction) (HCC) 08/01/2022   Rhabdomyolysis 08/01/2022   Fall at home, initial encounter 08/01/2022   Dementia without behavioral disturbance (HCC) 08/01/2022   Amnesia 09/29/2021   Osteoarthritis of knee 09/29/2021   Other long term (current) drug therapy 09/29/2021   Poor balance 09/29/2021   Screening for malignant neoplasm of prostate 09/29/2021   Hypercholesteremia    GERD (gastroesophageal reflux disease)    Angina pectoris (HCC)    Hyperlipidemia 05/14/2013   CAD (coronary artery disease) 05/14/2013   ST elevation myocardial infarction (STEMI) of lateral wall, initial episode of care (HCC) 05/12/2013    Home Medication(s) Prior to Admission medications   Medication Sig Start Date End Date Taking? Authorizing Provider  b complex vitamins tablet Take 0.5 tablets by mouth once a week.    [provider]  Calcium Carbonate Antacid 400 MG CHEW Chew 800 mg by mouth as needed (heart burn).    [provider]  clopidogrel (PLAVIX) 75 MG tablet Take 1 tablet (75 mg total) by mouth daily. 09/30/21   Baldo Daub, MD  docusate sodium (COLACE) 100 MG capsule Take 1 capsule (100 mg total) by mouth 2 (two) times daily. 08/06/22   Lorin Glass, MD  GLUCOSAMINE-CHONDROITIN PO Take 1 tablet by mouth daily.    [provider]  Multiple Vitamins-Minerals (MULTIVITAMIN WITH MINERALS) tablet Take 0.5 tablets by mouth daily.     [provider]  nitroGLYCERIN (NITROSTAT) 0.4 MG SL tablet Place 1 tablet (0.4 mg total) under the tongue every 5 (five) minutes x 3 doses as needed for chest pain. 09/30/21   Baldo Daub, MD  Omega-3 Fatty Acids (FISH OIL PO) Take 1 capsule by mouth daily.    [provider]  polyethylene glycol (MIRALAX / GLYCOLAX) 17 g packet Take 17 g by mouth daily as needed for mild constipation. 08/06/22   Lorin Glass, MD  QUEtiapine (SEROQUEL) 25 MG tablet Take 1 tablet (25 mg total) by mouth 2 (two) times daily. 08/06/22   Lorin Glass, MD  simvastatin (ZOCOR) 40 MG tablet Take 40 mg by mouth every evening.    [provider]  vitamin  E 180 MG (400 UNITS) capsule Take 400 Units by mouth daily.    [provider]                                                                                                                                    Past Surgical History Past Surgical History:  Procedure Laterality Date   ABDOMINAL SURGERY     CARDIAC CATHETERIZATION     CORONARY ANGIOPLASTY     LEFT HEART CATHETERIZATION WITH CORONARY ANGIOGRAM N/A 05/12/2013   Procedure: LEFT HEART CATHETERIZATION WITH CORONARY ANGIOGRAM;  Surgeon:  Micheline Chapman, MD;  [facility]: [facility] CATH LAB;  Service: Cardiovascular;  Laterality: N/A;   PERCUTANEOUS STENT INTERVENTION N/A 05/12/2013   Procedure: PERCUTANEOUS STENT INTERVENTION;  Surgeon: Micheline Chapman, MD;  Location: [facility] CATH LAB;  Service: Cardiovascular;  Laterality: N/A;   Family History Family History  Problem Relation Age of Onset   Alzheimer's disease Mother    CAD Brother    CAD Brother     Social History Social History   Tobacco Use   Smoking status: Former   Smokeless tobacco: Former  Building services engineer Use: Never used  Substance Use Topics   Alcohol use: No   Drug use: No   Allergies Patient has no known allergies.  Review of Systems Review of Systems  Skin:  Positive for wound.    Physical Exam Vital Signs  I have reviewed the triage vital signs BP (!) 142/73 (BP Location: Right Arm)   Pulse (!) 58   Temp 97.9 F (36.6 C) (Oral)   Resp 16   SpO2 100%   Physical Exam Constitutional:      General: He is not in acute distress.    Appearance: Normal appearance.  HENT:     Head: Normocephalic.     Comments: 2 cm laceration over the left elbow    Nose: No congestion or rhinorrhea.  Eyes:     General:        Right eye: No discharge.        Left eye: No discharge.     Extraocular Movements: Extraocular movements intact.     Pupils: Pupils are equal, round, and reactive to light.  Cardiovascular:     Rate and Rhythm: Normal rate and regular rhythm.     Heart sounds: No murmur heard. Pulmonary:     Effort: No respiratory distress.     Breath sounds: No wheezing or rales.  Abdominal:     General: There is no distension.     Tenderness: There is no abdominal tenderness.  Musculoskeletal:        General: Tenderness present. Normal range of motion.     Cervical back: Normal range of motion.  Skin:    General: Skin is warm and dry.     Findings: Bruising and lesion present.  Neurological:     General: No focal  deficit present.     Mental  Status: He is alert.     ED Results and Treatments Labs (all labs ordered are listed, but only abnormal results are displayed) Labs Reviewed  COMPREHENSIVE METABOLIC PANEL - Abnormal; Notable for the following components:      Result Value   Glucose, Bld 100 (*)    Total Protein 5.6 (*)    Albumin 3.2 (*)    All other components within normal limits  CBC WITH DIFFERENTIAL/PLATELET - Abnormal; Notable for the following components:   RBC 3.50 (*)    Hemoglobin 10.7 (*)    HCT 34.0 (*)    All other components within normal limits  URINALYSIS, ROUTINE W REFLEX MICROSCOPIC - Abnormal; Notable for the following components:   Color, Urine STRAW (*)    All other components within normal limits                                                                                                                          Radiology DG Hand Complete Left  Result Date: 08/12/2022 CLINICAL DATA:  Recent fall with left hand pain, initial encounter EXAM: LEFT HAND - COMPLETE 3+ VIEW COMPARISON:  None Available. FINDINGS: Degenerative changes of the first [facility] joint are noted. No acute fracture or dislocation is noted. No soft tissue abnormality is seen. IMPRESSION: Degenerative change without acute abnormality. Electronically Signed   By: Alcide Clever M.D.   On: 08/12/2022 00:05   DG Wrist Complete Right  Result Date: 08/12/2022 CLINICAL DATA:  Recent fall with right wrist pain, initial encounter EXAM: RIGHT WRIST - COMPLETE 3+ VIEW COMPARISON:  None Available. FINDINGS: There is no evidence of fracture or dislocation. There is no evidence of arthropathy or other focal bone abnormality. Soft tissues are unremarkable. IMPRESSION: No acute abnormality noted. Electronically Signed   By: Alcide Clever M.D.   On: 08/12/2022 00:04   DG Pelvis Portable  Result Date: 08/11/2022 CLINICAL DATA:  Fall EXAM: PORTABLE PELVIS 1-2 VIEWS COMPARISON:  08/01/2022 FINDINGS: Large stool in the colon. SI joints are non  widened. Pubic symphysis and rami appear intact. No definitive fracture or malalignment. Bilateral hip arthritis. IMPRESSION: No definite acute osseous abnormality. Electronically Signed   By: Jasmine Pang M.D.   On: 08/11/2022 23:58   DG Chest Portable 1 View  Result Date: 08/11/2022 CLINICAL DATA:  Fall EXAM: PORTABLE CHEST 1 VIEW COMPARISON:  08/01/2022 FINDINGS: The heart size and mediastinal contours are within normal limits. Aortic atherosclerosis. Both lungs are clear. The visualized skeletal structures are unremarkable. IMPRESSION: No active disease. Electronically Signed   By: Jasmine Pang M.D.   On: 08/11/2022 23:57   CT HEAD WO CONTRAST ( )  Result Date: 08/11/2022 CLINICAL DATA:  Multiple falls recently.  Laceration to left cheek. EXAM: CT HEAD WITHOUT CONTRAST CT CERVICAL SPINE WITHOUT CONTRAST TECHNIQUE: Multidetector CT imaging of the head and cervical spine was performed following the standard protocol without intravenous contrast. Multiplanar CT image  reconstructions of the cervical spine were also generated. RADIATION DOSE REDUCTION: This exam was performed according to the departmental dose-optimization program which includes automated exposure control, adjustment of the mA and/or kV according to patient size and/or use of iterative reconstruction technique. COMPARISON:  CT head and cervical spine 08/01/2022 FINDINGS: CT HEAD FINDINGS Brain: No intracranial hemorrhage, mass effect, or evidence of acute infarct. No hydrocephalus. No extra-axial fluid collection. Generalized cerebral atrophy. Ill-defined hypoattenuation within the cerebral white matter is nonspecific but consistent with chronic small vessel ischemic disease. Vascular: No hyperdense vessel. Intracranial arterial calcification. Skull: No fracture or focal lesion.  Contusion about the left face. Sinuses/Orbits: No acute finding. Paranasal sinuses and mastoid air cells are well aerated. Other: None. CT CERVICAL SPINE  FINDINGS Alignment: No evidence of traumatic malalignment. Skull base and vertebrae: No acute fracture. No primary bone lesion or focal pathologic process. Soft tissues and spinal canal: No prevertebral fluid or swelling. No visible canal hematoma. Disc levels: Moderate multilevel spondylosis, disc space height loss, and degenerative endplate changes. No severe spinal canal narrowing. Uncovertebral spurring and facet arthropathy cause advanced neural foraminal narrowing on the left at C3-C4, bilaterally at C4-C5, and bilaterally at C5-C6. Upper chest: Negative. Other: None. IMPRESSION: 1. No acute intracranial abnormality. 2. No acute fracture in the cervical spine. 3. Contusion to the left face. Electronically Signed   By: Minerva Fester M.D.   On: 08/11/2022 23:48   CT CERVICAL SPINE WO CONTRAST  Result Date: 08/11/2022 CLINICAL DATA:  Multiple falls recently.  Laceration to left cheek. EXAM: CT HEAD WITHOUT CONTRAST CT CERVICAL SPINE WITHOUT CONTRAST TECHNIQUE: Multidetector CT imaging of the head and cervical spine was performed following the standard protocol without intravenous contrast. Multiplanar CT image reconstructions of the cervical spine were also generated. RADIATION DOSE REDUCTION: This exam was performed according to the departmental dose-optimization program which includes automated exposure control, adjustment of the mA and/or kV according to patient size and/or use of iterative reconstruction technique. COMPARISON:  CT head and cervical spine 08/01/2022 FINDINGS: CT HEAD FINDINGS Brain: No intracranial hemorrhage, mass effect, or evidence of acute infarct. No hydrocephalus. No extra-axial fluid collection. Generalized cerebral atrophy. Ill-defined hypoattenuation within the cerebral white matter is nonspecific but consistent with chronic small vessel ischemic disease. Vascular: No hyperdense vessel. Intracranial arterial calcification. Skull: No fracture or focal lesion.  Contusion about the  left face. Sinuses/Orbits: No acute finding. Paranasal sinuses and mastoid air cells are well aerated. Other: None. CT CERVICAL SPINE FINDINGS Alignment: No evidence of traumatic malalignment. Skull base and vertebrae: No acute fracture. No primary bone lesion or focal pathologic process. Soft tissues and spinal canal: No prevertebral fluid or swelling. No visible canal hematoma. Disc levels: Moderate multilevel spondylosis, disc space height loss, and degenerative endplate changes. No severe spinal canal narrowing. Uncovertebral spurring and facet arthropathy cause advanced neural foraminal narrowing on the left at C3-C4, bilaterally at C4-C5, and bilaterally at C5-C6. Upper chest: Negative. Other: None. IMPRESSION: 1. No acute intracranial abnormality. 2. No acute fracture in the cervical spine. 3. Contusion to the left face. Electronically Signed   By: Minerva Fester M.D.   On: 08/11/2022 23:48    Pertinent labs & imaging results that were available during my care of the patient were reviewed by me and considered in my medical decision making (see MDM for details).  Medications Ordered in ED Medications  Tdap (BOOSTRIX) injection 0.5 mL (0.5 mLs Intramuscular Given 08/12/22 0037)  Procedures .Marland KitchenLaceration Repair  Date/Time: 08/12/2022 3:13 AM  Performed by: Glendora Score, MD Authorized by: Glendora Score, MD   Anesthesia:    Anesthesia method:  None Laceration details:    Location:  Face   Face location:  L eyebrow   Length (cm):  2 Treatment:    Amount of cleaning:  Standard Skin repair:    Repair method:  Tissue adhesive Approximation:    Approximation:  Close Repair type:    Repair type:  Simple Post-procedure details:    Dressing:  Open (no dressing)   (including critical care time)  Medical Decision Making / ED Course   This patient  presents to the ED for concern of fall, this involves an extensive number of treatment options, and is a complaint that carries with it a high risk of complications and morbidity.  The differential diagnosis includes fracture, hematoma, contusion, ligamentous injury, closed head injury, ICH  MDM: Patient seen emergency room for evaluation of a fall.  Physical exam with skin tears to bilateral hands and wrists, laceration of the left eyebrow.  Laboratory evaluation with a hemoglobin of 10.7 but is otherwise unremarkable.  Urinalysis without evidence of infection.  Tetanus updated.  Trauma imaging including x-ray of left hand, right wrist, chest, pelvis unremarkable.  CT head and C-spine unremarkable.  Laceration repaired with tissue adhesive and patient discharged with outpatient follow-up.  He does not meet inpatient criteria for admission with negative trauma workup at this time.   Additional history obtained: -Additional history obtained from family member -External records from outside source obtained and reviewed including: Chart review including previous notes, labs, imaging, consultation notes   Lab Tests: -I ordered, reviewed, and interpreted labs.   The pertinent results include:   Labs Reviewed  COMPREHENSIVE METABOLIC PANEL - Abnormal; Notable for the following components:      Result Value   Glucose, Bld 100 (*)    Total Protein 5.6 (*)    Albumin 3.2 (*)    All other components within normal limits  CBC WITH DIFFERENTIAL/PLATELET - Abnormal; Notable for the following components:   RBC 3.50 (*)    Hemoglobin 10.7 (*)    HCT 34.0 (*)    All other components within normal limits  URINALYSIS, ROUTINE W REFLEX MICROSCOPIC - Abnormal; Notable for the following components:   Color, Urine STRAW (*)    All other components within normal limits      Imaging Studies ordered: I ordered imaging studies including  x-ray left hand, right wrist, pelvis, chest, CT head and C-spine I  independently visualized and interpreted imaging. I agree with the radiologist interpretation   Medicines ordered and prescription drug management: Meds ordered this encounter  Medications   Tdap (BOOSTRIX) injection 0.5 mL    -I have reviewed the patients home medicines and have made adjustments as needed  Critical interventions none    Cardiac Monitoring: The patient was maintained on a cardiac monitor.  I personally viewed and interpreted the cardiac monitored which showed an underlying rhythm of: NSR  Social Determinants of Health:  Factors impacting patients care include: Lives in skilled nursing facility   Reevaluation: After the interventions noted above, I reevaluated the patient and found that they have :improved  Co morbidities that complicate the patient evaluation  Past Medical History:  Diagnosis Date   Amnesia 09/29/2021   Angina pectoris (HCC)    CAD (coronary artery disease) 05/14/2013   GERD (gastroesophageal reflux disease)    Hypercholesteremia  Hyperlipidemia 05/14/2013   Osteoarthritis of knee 09/29/2021   Other long term (current) drug therapy 09/29/2021   Poor balance 09/29/2021   Screening for malignant neoplasm of prostate 09/29/2021   ST elevation myocardial infarction (STEMI) of lateral wall, initial episode of care [facility]) 05/12/2013      Dispostion: I considered admission for this patient, but at this time he does not meet inpatient criteria for admission and safe for discharge with outpatient follow-up     Final Clinical Impression(s) / ED Diagnoses Final diagnoses:  Fall, initial encounter     @PCDICTATION @    Glendora Score, MD 08/12/22 [phone]

## 2022-08-16 DIAGNOSIS — F0390 Unspecified dementia without behavioral disturbance: Secondary | ICD-10-CM | POA: Diagnosis not present

## 2022-08-16 DIAGNOSIS — S42001D Fracture of unspecified part of right clavicle, subsequent encounter for fracture with routine healing: Secondary | ICD-10-CM | POA: Diagnosis not present

## 2022-08-16 DIAGNOSIS — S42021K Displaced fracture of shaft of right clavicle, subsequent encounter for fracture with nonunion: Secondary | ICD-10-CM | POA: Diagnosis not present

## 2022-08-16 DIAGNOSIS — M6281 Muscle weakness (generalized): Secondary | ICD-10-CM | POA: Diagnosis not present

## 2022-08-16 DIAGNOSIS — I214 Non-ST elevation (NSTEMI) myocardial infarction: Secondary | ICD-10-CM | POA: Diagnosis not present

## 2022-08-16 DIAGNOSIS — R41841 Cognitive communication deficit: Secondary | ICD-10-CM | POA: Diagnosis not present

## 2022-08-16 DIAGNOSIS — Z9181 History of falling: Secondary | ICD-10-CM | POA: Diagnosis not present

## 2022-08-16 DIAGNOSIS — M6282 Rhabdomyolysis: Secondary | ICD-10-CM | POA: Diagnosis not present

## 2022-08-19 DIAGNOSIS — Z9181 History of falling: Secondary | ICD-10-CM | POA: Diagnosis not present

## 2022-08-19 DIAGNOSIS — M6281 Muscle weakness (generalized): Secondary | ICD-10-CM | POA: Diagnosis not present

## 2022-08-19 DIAGNOSIS — R41841 Cognitive communication deficit: Secondary | ICD-10-CM | POA: Diagnosis not present

## 2022-08-19 DIAGNOSIS — S42001D Fracture of unspecified part of right clavicle, subsequent encounter for fracture with routine healing: Secondary | ICD-10-CM | POA: Diagnosis not present

## 2022-08-23 DIAGNOSIS — R41841 Cognitive communication deficit: Secondary | ICD-10-CM | POA: Diagnosis not present

## 2022-08-23 DIAGNOSIS — Z9181 History of falling: Secondary | ICD-10-CM | POA: Diagnosis not present

## 2022-08-23 DIAGNOSIS — M6281 Muscle weakness (generalized): Secondary | ICD-10-CM | POA: Diagnosis not present

## 2022-08-23 DIAGNOSIS — S42001D Fracture of unspecified part of right clavicle, subsequent encounter for fracture with routine healing: Secondary | ICD-10-CM | POA: Diagnosis not present

## 2022-08-26 DIAGNOSIS — S42021K Displaced fracture of shaft of right clavicle, subsequent encounter for fracture with nonunion: Secondary | ICD-10-CM | POA: Diagnosis not present

## 2022-08-26 DIAGNOSIS — E785 Hyperlipidemia, unspecified: Secondary | ICD-10-CM | POA: Diagnosis not present

## 2022-08-26 DIAGNOSIS — M6282 Rhabdomyolysis: Secondary | ICD-10-CM | POA: Diagnosis not present

## 2022-08-26 DIAGNOSIS — E559 Vitamin D deficiency, unspecified: Secondary | ICD-10-CM | POA: Diagnosis not present

## 2022-09-08 DIAGNOSIS — Z9181 History of falling: Secondary | ICD-10-CM | POA: Diagnosis not present

## 2022-09-08 DIAGNOSIS — Z556 Problems related to health literacy: Secondary | ICD-10-CM | POA: Diagnosis not present

## 2022-09-08 DIAGNOSIS — F03911 Unspecified dementia, unspecified severity, with agitation: Secondary | ICD-10-CM | POA: Diagnosis not present

## 2022-09-08 DIAGNOSIS — I10 Essential (primary) hypertension: Secondary | ICD-10-CM | POA: Diagnosis not present

## 2022-09-08 DIAGNOSIS — I2510 Atherosclerotic heart disease of native coronary artery without angina pectoris: Secondary | ICD-10-CM | POA: Diagnosis not present

## 2022-09-08 DIAGNOSIS — T796XXD Traumatic ischemia of muscle, subsequent encounter: Secondary | ICD-10-CM | POA: Diagnosis not present

## 2022-09-08 DIAGNOSIS — M1990 Unspecified osteoarthritis, unspecified site: Secondary | ICD-10-CM | POA: Diagnosis not present

## 2022-09-08 DIAGNOSIS — E785 Hyperlipidemia, unspecified: Secondary | ICD-10-CM | POA: Diagnosis not present

## 2022-09-08 DIAGNOSIS — R1310 Dysphagia, unspecified: Secondary | ICD-10-CM | POA: Diagnosis not present

## 2022-09-08 DIAGNOSIS — S42024D Nondisplaced fracture of shaft of right clavicle, subsequent encounter for fracture with routine healing: Secondary | ICD-10-CM | POA: Diagnosis not present

## 2022-09-08 DIAGNOSIS — Z87440 Personal history of urinary (tract) infections: Secondary | ICD-10-CM | POA: Diagnosis not present

## 2022-09-08 DIAGNOSIS — K219 Gastro-esophageal reflux disease without esophagitis: Secondary | ICD-10-CM | POA: Diagnosis not present

## 2022-09-08 DIAGNOSIS — F0390 Unspecified dementia without behavioral disturbance: Secondary | ICD-10-CM | POA: Diagnosis not present

## 2022-09-08 DIAGNOSIS — I252 Old myocardial infarction: Secondary | ICD-10-CM | POA: Diagnosis not present

## 2022-09-08 DIAGNOSIS — Z7902 Long term (current) use of antithrombotics/antiplatelets: Secondary | ICD-10-CM | POA: Diagnosis not present

## 2022-09-13 DIAGNOSIS — I252 Old myocardial infarction: Secondary | ICD-10-CM | POA: Diagnosis not present

## 2022-09-13 DIAGNOSIS — T796XXD Traumatic ischemia of muscle, subsequent encounter: Secondary | ICD-10-CM | POA: Diagnosis not present

## 2022-09-13 DIAGNOSIS — M1990 Unspecified osteoarthritis, unspecified site: Secondary | ICD-10-CM | POA: Diagnosis not present

## 2022-09-13 DIAGNOSIS — I10 Essential (primary) hypertension: Secondary | ICD-10-CM | POA: Diagnosis not present

## 2022-09-13 DIAGNOSIS — Z7902 Long term (current) use of antithrombotics/antiplatelets: Secondary | ICD-10-CM | POA: Diagnosis not present

## 2022-09-13 DIAGNOSIS — Z556 Problems related to health literacy: Secondary | ICD-10-CM | POA: Diagnosis not present

## 2022-09-13 DIAGNOSIS — K219 Gastro-esophageal reflux disease without esophagitis: Secondary | ICD-10-CM | POA: Diagnosis not present

## 2022-09-13 DIAGNOSIS — S42024D Nondisplaced fracture of shaft of right clavicle, subsequent encounter for fracture with routine healing: Secondary | ICD-10-CM | POA: Diagnosis not present

## 2022-09-13 DIAGNOSIS — Z9181 History of falling: Secondary | ICD-10-CM | POA: Diagnosis not present

## 2022-09-13 DIAGNOSIS — R1310 Dysphagia, unspecified: Secondary | ICD-10-CM | POA: Diagnosis not present

## 2022-09-13 DIAGNOSIS — E785 Hyperlipidemia, unspecified: Secondary | ICD-10-CM | POA: Diagnosis not present

## 2022-09-13 DIAGNOSIS — Z87440 Personal history of urinary (tract) infections: Secondary | ICD-10-CM | POA: Diagnosis not present

## 2022-09-13 DIAGNOSIS — I2510 Atherosclerotic heart disease of native coronary artery without angina pectoris: Secondary | ICD-10-CM | POA: Diagnosis not present

## 2022-09-13 DIAGNOSIS — F0390 Unspecified dementia without behavioral disturbance: Secondary | ICD-10-CM | POA: Diagnosis not present

## 2022-09-14 DIAGNOSIS — L609 Nail disorder, unspecified: Secondary | ICD-10-CM | POA: Diagnosis not present

## 2022-09-14 DIAGNOSIS — L84 Corns and callosities: Secondary | ICD-10-CM | POA: Diagnosis not present

## 2022-09-21 DIAGNOSIS — T796XXD Traumatic ischemia of muscle, subsequent encounter: Secondary | ICD-10-CM | POA: Diagnosis not present

## 2022-09-21 DIAGNOSIS — Z556 Problems related to health literacy: Secondary | ICD-10-CM | POA: Diagnosis not present

## 2022-09-21 DIAGNOSIS — Z9181 History of falling: Secondary | ICD-10-CM | POA: Diagnosis not present

## 2022-09-21 DIAGNOSIS — M1990 Unspecified osteoarthritis, unspecified site: Secondary | ICD-10-CM | POA: Diagnosis not present

## 2022-09-21 DIAGNOSIS — R1310 Dysphagia, unspecified: Secondary | ICD-10-CM | POA: Diagnosis not present

## 2022-09-21 DIAGNOSIS — K219 Gastro-esophageal reflux disease without esophagitis: Secondary | ICD-10-CM | POA: Diagnosis not present

## 2022-09-21 DIAGNOSIS — Z87440 Personal history of urinary (tract) infections: Secondary | ICD-10-CM | POA: Diagnosis not present

## 2022-09-21 DIAGNOSIS — I252 Old myocardial infarction: Secondary | ICD-10-CM | POA: Diagnosis not present

## 2022-09-21 DIAGNOSIS — Z7902 Long term (current) use of antithrombotics/antiplatelets: Secondary | ICD-10-CM | POA: Diagnosis not present

## 2022-09-21 DIAGNOSIS — F0390 Unspecified dementia without behavioral disturbance: Secondary | ICD-10-CM | POA: Diagnosis not present

## 2022-09-21 DIAGNOSIS — I2510 Atherosclerotic heart disease of native coronary artery without angina pectoris: Secondary | ICD-10-CM | POA: Diagnosis not present

## 2022-09-21 DIAGNOSIS — S42024D Nondisplaced fracture of shaft of right clavicle, subsequent encounter for fracture with routine healing: Secondary | ICD-10-CM | POA: Diagnosis not present

## 2022-09-21 DIAGNOSIS — E785 Hyperlipidemia, unspecified: Secondary | ICD-10-CM | POA: Diagnosis not present

## 2022-09-21 DIAGNOSIS — I10 Essential (primary) hypertension: Secondary | ICD-10-CM | POA: Diagnosis not present

## 2022-09-23 DIAGNOSIS — K219 Gastro-esophageal reflux disease without esophagitis: Secondary | ICD-10-CM | POA: Diagnosis not present

## 2022-09-23 DIAGNOSIS — M1990 Unspecified osteoarthritis, unspecified site: Secondary | ICD-10-CM | POA: Diagnosis not present

## 2022-09-23 DIAGNOSIS — R1310 Dysphagia, unspecified: Secondary | ICD-10-CM | POA: Diagnosis not present

## 2022-09-23 DIAGNOSIS — Z87440 Personal history of urinary (tract) infections: Secondary | ICD-10-CM | POA: Diagnosis not present

## 2022-09-23 DIAGNOSIS — T796XXD Traumatic ischemia of muscle, subsequent encounter: Secondary | ICD-10-CM | POA: Diagnosis not present

## 2022-09-23 DIAGNOSIS — I2510 Atherosclerotic heart disease of native coronary artery without angina pectoris: Secondary | ICD-10-CM | POA: Diagnosis not present

## 2022-09-23 DIAGNOSIS — S42024D Nondisplaced fracture of shaft of right clavicle, subsequent encounter for fracture with routine healing: Secondary | ICD-10-CM | POA: Diagnosis not present

## 2022-09-23 DIAGNOSIS — I10 Essential (primary) hypertension: Secondary | ICD-10-CM | POA: Diagnosis not present

## 2022-09-23 DIAGNOSIS — Z556 Problems related to health literacy: Secondary | ICD-10-CM | POA: Diagnosis not present

## 2022-09-23 DIAGNOSIS — Z7902 Long term (current) use of antithrombotics/antiplatelets: Secondary | ICD-10-CM | POA: Diagnosis not present

## 2022-09-23 DIAGNOSIS — Z9181 History of falling: Secondary | ICD-10-CM | POA: Diagnosis not present

## 2022-09-23 DIAGNOSIS — E785 Hyperlipidemia, unspecified: Secondary | ICD-10-CM | POA: Diagnosis not present

## 2022-09-23 DIAGNOSIS — F0390 Unspecified dementia without behavioral disturbance: Secondary | ICD-10-CM | POA: Diagnosis not present

## 2022-09-23 DIAGNOSIS — I252 Old myocardial infarction: Secondary | ICD-10-CM | POA: Diagnosis not present

## 2022-09-28 DIAGNOSIS — M1990 Unspecified osteoarthritis, unspecified site: Secondary | ICD-10-CM | POA: Diagnosis not present

## 2022-09-28 DIAGNOSIS — I10 Essential (primary) hypertension: Secondary | ICD-10-CM | POA: Diagnosis not present

## 2022-09-28 DIAGNOSIS — I2510 Atherosclerotic heart disease of native coronary artery without angina pectoris: Secondary | ICD-10-CM | POA: Diagnosis not present

## 2022-09-28 DIAGNOSIS — K219 Gastro-esophageal reflux disease without esophagitis: Secondary | ICD-10-CM | POA: Diagnosis not present

## 2022-09-28 DIAGNOSIS — Z87440 Personal history of urinary (tract) infections: Secondary | ICD-10-CM | POA: Diagnosis not present

## 2022-09-28 DIAGNOSIS — F0390 Unspecified dementia without behavioral disturbance: Secondary | ICD-10-CM | POA: Diagnosis not present

## 2022-09-28 DIAGNOSIS — T796XXD Traumatic ischemia of muscle, subsequent encounter: Secondary | ICD-10-CM | POA: Diagnosis not present

## 2022-09-28 DIAGNOSIS — S42024D Nondisplaced fracture of shaft of right clavicle, subsequent encounter for fracture with routine healing: Secondary | ICD-10-CM | POA: Diagnosis not present

## 2022-09-28 DIAGNOSIS — R1310 Dysphagia, unspecified: Secondary | ICD-10-CM | POA: Diagnosis not present

## 2022-09-28 DIAGNOSIS — I252 Old myocardial infarction: Secondary | ICD-10-CM | POA: Diagnosis not present

## 2022-09-28 DIAGNOSIS — Z7902 Long term (current) use of antithrombotics/antiplatelets: Secondary | ICD-10-CM | POA: Diagnosis not present

## 2022-09-28 DIAGNOSIS — Z9181 History of falling: Secondary | ICD-10-CM | POA: Diagnosis not present

## 2022-09-28 DIAGNOSIS — E785 Hyperlipidemia, unspecified: Secondary | ICD-10-CM | POA: Diagnosis not present

## 2022-09-28 DIAGNOSIS — Z556 Problems related to health literacy: Secondary | ICD-10-CM | POA: Diagnosis not present

## 2022-10-01 DIAGNOSIS — Z87440 Personal history of urinary (tract) infections: Secondary | ICD-10-CM | POA: Diagnosis not present

## 2022-10-01 DIAGNOSIS — T796XXD Traumatic ischemia of muscle, subsequent encounter: Secondary | ICD-10-CM | POA: Diagnosis not present

## 2022-10-01 DIAGNOSIS — Z9181 History of falling: Secondary | ICD-10-CM | POA: Diagnosis not present

## 2022-10-01 DIAGNOSIS — I10 Essential (primary) hypertension: Secondary | ICD-10-CM | POA: Diagnosis not present

## 2022-10-01 DIAGNOSIS — Z556 Problems related to health literacy: Secondary | ICD-10-CM | POA: Diagnosis not present

## 2022-10-01 DIAGNOSIS — K219 Gastro-esophageal reflux disease without esophagitis: Secondary | ICD-10-CM | POA: Diagnosis not present

## 2022-10-01 DIAGNOSIS — M1990 Unspecified osteoarthritis, unspecified site: Secondary | ICD-10-CM | POA: Diagnosis not present

## 2022-10-01 DIAGNOSIS — S42024D Nondisplaced fracture of shaft of right clavicle, subsequent encounter for fracture with routine healing: Secondary | ICD-10-CM | POA: Diagnosis not present

## 2022-10-01 DIAGNOSIS — Z7902 Long term (current) use of antithrombotics/antiplatelets: Secondary | ICD-10-CM | POA: Diagnosis not present

## 2022-10-01 DIAGNOSIS — I252 Old myocardial infarction: Secondary | ICD-10-CM | POA: Diagnosis not present

## 2022-10-01 DIAGNOSIS — E785 Hyperlipidemia, unspecified: Secondary | ICD-10-CM | POA: Diagnosis not present

## 2022-10-01 DIAGNOSIS — I2510 Atherosclerotic heart disease of native coronary artery without angina pectoris: Secondary | ICD-10-CM | POA: Diagnosis not present

## 2022-10-01 DIAGNOSIS — F0390 Unspecified dementia without behavioral disturbance: Secondary | ICD-10-CM | POA: Diagnosis not present

## 2022-10-01 DIAGNOSIS — R1310 Dysphagia, unspecified: Secondary | ICD-10-CM | POA: Diagnosis not present

## 2022-10-04 DIAGNOSIS — S42024D Nondisplaced fracture of shaft of right clavicle, subsequent encounter for fracture with routine healing: Secondary | ICD-10-CM | POA: Diagnosis not present

## 2022-10-04 DIAGNOSIS — F0390 Unspecified dementia without behavioral disturbance: Secondary | ICD-10-CM | POA: Diagnosis not present

## 2022-10-04 DIAGNOSIS — I2510 Atherosclerotic heart disease of native coronary artery without angina pectoris: Secondary | ICD-10-CM | POA: Diagnosis not present

## 2022-10-04 DIAGNOSIS — T796XXD Traumatic ischemia of muscle, subsequent encounter: Secondary | ICD-10-CM | POA: Diagnosis not present

## 2022-10-04 DIAGNOSIS — K219 Gastro-esophageal reflux disease without esophagitis: Secondary | ICD-10-CM | POA: Diagnosis not present

## 2022-10-04 DIAGNOSIS — E785 Hyperlipidemia, unspecified: Secondary | ICD-10-CM | POA: Diagnosis not present

## 2022-10-04 DIAGNOSIS — Z9181 History of falling: Secondary | ICD-10-CM | POA: Diagnosis not present

## 2022-10-04 DIAGNOSIS — R1310 Dysphagia, unspecified: Secondary | ICD-10-CM | POA: Diagnosis not present

## 2022-10-04 DIAGNOSIS — I252 Old myocardial infarction: Secondary | ICD-10-CM | POA: Diagnosis not present

## 2022-10-04 DIAGNOSIS — I10 Essential (primary) hypertension: Secondary | ICD-10-CM | POA: Diagnosis not present

## 2022-10-04 DIAGNOSIS — Z7902 Long term (current) use of antithrombotics/antiplatelets: Secondary | ICD-10-CM | POA: Diagnosis not present

## 2022-10-04 DIAGNOSIS — Z556 Problems related to health literacy: Secondary | ICD-10-CM | POA: Diagnosis not present

## 2022-10-04 DIAGNOSIS — Z87440 Personal history of urinary (tract) infections: Secondary | ICD-10-CM | POA: Diagnosis not present

## 2022-10-04 DIAGNOSIS — M1990 Unspecified osteoarthritis, unspecified site: Secondary | ICD-10-CM | POA: Diagnosis not present

## 2022-10-05 DIAGNOSIS — R410 Disorientation, unspecified: Secondary | ICD-10-CM | POA: Diagnosis not present

## 2022-10-05 DIAGNOSIS — N390 Urinary tract infection, site not specified: Secondary | ICD-10-CM | POA: Diagnosis not present

## 2022-10-05 DIAGNOSIS — K219 Gastro-esophageal reflux disease without esophagitis: Secondary | ICD-10-CM | POA: Diagnosis not present

## 2022-10-05 DIAGNOSIS — E441 Mild protein-calorie malnutrition: Secondary | ICD-10-CM | POA: Diagnosis not present

## 2022-10-06 DIAGNOSIS — K219 Gastro-esophageal reflux disease without esophagitis: Secondary | ICD-10-CM | POA: Diagnosis not present

## 2022-10-06 DIAGNOSIS — R1310 Dysphagia, unspecified: Secondary | ICD-10-CM | POA: Diagnosis not present

## 2022-10-06 DIAGNOSIS — I10 Essential (primary) hypertension: Secondary | ICD-10-CM | POA: Diagnosis not present

## 2022-10-06 DIAGNOSIS — S42024D Nondisplaced fracture of shaft of right clavicle, subsequent encounter for fracture with routine healing: Secondary | ICD-10-CM | POA: Diagnosis not present

## 2022-10-06 DIAGNOSIS — M1990 Unspecified osteoarthritis, unspecified site: Secondary | ICD-10-CM | POA: Diagnosis not present

## 2022-10-06 DIAGNOSIS — I252 Old myocardial infarction: Secondary | ICD-10-CM | POA: Diagnosis not present

## 2022-10-06 DIAGNOSIS — Z556 Problems related to health literacy: Secondary | ICD-10-CM | POA: Diagnosis not present

## 2022-10-06 DIAGNOSIS — I2510 Atherosclerotic heart disease of native coronary artery without angina pectoris: Secondary | ICD-10-CM | POA: Diagnosis not present

## 2022-10-06 DIAGNOSIS — E785 Hyperlipidemia, unspecified: Secondary | ICD-10-CM | POA: Diagnosis not present

## 2022-10-06 DIAGNOSIS — Z9181 History of falling: Secondary | ICD-10-CM | POA: Diagnosis not present

## 2022-10-06 DIAGNOSIS — Z87440 Personal history of urinary (tract) infections: Secondary | ICD-10-CM | POA: Diagnosis not present

## 2022-10-06 DIAGNOSIS — Z7902 Long term (current) use of antithrombotics/antiplatelets: Secondary | ICD-10-CM | POA: Diagnosis not present

## 2022-10-06 DIAGNOSIS — F0390 Unspecified dementia without behavioral disturbance: Secondary | ICD-10-CM | POA: Diagnosis not present

## 2022-10-06 DIAGNOSIS — T796XXD Traumatic ischemia of muscle, subsequent encounter: Secondary | ICD-10-CM | POA: Diagnosis not present

## 2022-10-07 DIAGNOSIS — F03B0 Unspecified dementia, moderate, without behavioral disturbance, psychotic disturbance, mood disturbance, and anxiety: Secondary | ICD-10-CM | POA: Diagnosis not present

## 2022-10-08 DIAGNOSIS — Z7902 Long term (current) use of antithrombotics/antiplatelets: Secondary | ICD-10-CM | POA: Diagnosis not present

## 2022-10-08 DIAGNOSIS — K219 Gastro-esophageal reflux disease without esophagitis: Secondary | ICD-10-CM | POA: Diagnosis not present

## 2022-10-08 DIAGNOSIS — S42024D Nondisplaced fracture of shaft of right clavicle, subsequent encounter for fracture with routine healing: Secondary | ICD-10-CM | POA: Diagnosis not present

## 2022-10-08 DIAGNOSIS — Z556 Problems related to health literacy: Secondary | ICD-10-CM | POA: Diagnosis not present

## 2022-10-08 DIAGNOSIS — R1310 Dysphagia, unspecified: Secondary | ICD-10-CM | POA: Diagnosis not present

## 2022-10-08 DIAGNOSIS — F0390 Unspecified dementia without behavioral disturbance: Secondary | ICD-10-CM | POA: Diagnosis not present

## 2022-10-08 DIAGNOSIS — I2510 Atherosclerotic heart disease of native coronary artery without angina pectoris: Secondary | ICD-10-CM | POA: Diagnosis not present

## 2022-10-08 DIAGNOSIS — T796XXD Traumatic ischemia of muscle, subsequent encounter: Secondary | ICD-10-CM | POA: Diagnosis not present

## 2022-10-08 DIAGNOSIS — I10 Essential (primary) hypertension: Secondary | ICD-10-CM | POA: Diagnosis not present

## 2022-10-08 DIAGNOSIS — M1990 Unspecified osteoarthritis, unspecified site: Secondary | ICD-10-CM | POA: Diagnosis not present

## 2022-10-08 DIAGNOSIS — I252 Old myocardial infarction: Secondary | ICD-10-CM | POA: Diagnosis not present

## 2022-10-08 DIAGNOSIS — E785 Hyperlipidemia, unspecified: Secondary | ICD-10-CM | POA: Diagnosis not present

## 2022-10-08 DIAGNOSIS — Z87440 Personal history of urinary (tract) infections: Secondary | ICD-10-CM | POA: Diagnosis not present

## 2022-10-08 DIAGNOSIS — Z9181 History of falling: Secondary | ICD-10-CM | POA: Diagnosis not present

## 2022-10-13 DIAGNOSIS — I10 Essential (primary) hypertension: Secondary | ICD-10-CM | POA: Diagnosis not present

## 2022-10-13 DIAGNOSIS — R1310 Dysphagia, unspecified: Secondary | ICD-10-CM | POA: Diagnosis not present

## 2022-10-13 DIAGNOSIS — Z556 Problems related to health literacy: Secondary | ICD-10-CM | POA: Diagnosis not present

## 2022-10-13 DIAGNOSIS — Z7902 Long term (current) use of antithrombotics/antiplatelets: Secondary | ICD-10-CM | POA: Diagnosis not present

## 2022-10-13 DIAGNOSIS — Z9181 History of falling: Secondary | ICD-10-CM | POA: Diagnosis not present

## 2022-10-13 DIAGNOSIS — S42024D Nondisplaced fracture of shaft of right clavicle, subsequent encounter for fracture with routine healing: Secondary | ICD-10-CM | POA: Diagnosis not present

## 2022-10-13 DIAGNOSIS — T796XXD Traumatic ischemia of muscle, subsequent encounter: Secondary | ICD-10-CM | POA: Diagnosis not present

## 2022-10-13 DIAGNOSIS — E785 Hyperlipidemia, unspecified: Secondary | ICD-10-CM | POA: Diagnosis not present

## 2022-10-13 DIAGNOSIS — I2510 Atherosclerotic heart disease of native coronary artery without angina pectoris: Secondary | ICD-10-CM | POA: Diagnosis not present

## 2022-10-13 DIAGNOSIS — Z87440 Personal history of urinary (tract) infections: Secondary | ICD-10-CM | POA: Diagnosis not present

## 2022-10-13 DIAGNOSIS — K219 Gastro-esophageal reflux disease without esophagitis: Secondary | ICD-10-CM | POA: Diagnosis not present

## 2022-10-13 DIAGNOSIS — I252 Old myocardial infarction: Secondary | ICD-10-CM | POA: Diagnosis not present

## 2022-10-13 DIAGNOSIS — F0390 Unspecified dementia without behavioral disturbance: Secondary | ICD-10-CM | POA: Diagnosis not present

## 2022-10-13 DIAGNOSIS — M1990 Unspecified osteoarthritis, unspecified site: Secondary | ICD-10-CM | POA: Diagnosis not present

## 2022-10-20 DIAGNOSIS — I10 Essential (primary) hypertension: Secondary | ICD-10-CM | POA: Diagnosis not present

## 2022-10-20 DIAGNOSIS — M1990 Unspecified osteoarthritis, unspecified site: Secondary | ICD-10-CM | POA: Diagnosis not present

## 2022-10-20 DIAGNOSIS — E785 Hyperlipidemia, unspecified: Secondary | ICD-10-CM | POA: Diagnosis not present

## 2022-10-20 DIAGNOSIS — Z87440 Personal history of urinary (tract) infections: Secondary | ICD-10-CM | POA: Diagnosis not present

## 2022-10-20 DIAGNOSIS — K219 Gastro-esophageal reflux disease without esophagitis: Secondary | ICD-10-CM | POA: Diagnosis not present

## 2022-10-20 DIAGNOSIS — S42024D Nondisplaced fracture of shaft of right clavicle, subsequent encounter for fracture with routine healing: Secondary | ICD-10-CM | POA: Diagnosis not present

## 2022-10-20 DIAGNOSIS — Z9181 History of falling: Secondary | ICD-10-CM | POA: Diagnosis not present

## 2022-10-20 DIAGNOSIS — Z7902 Long term (current) use of antithrombotics/antiplatelets: Secondary | ICD-10-CM | POA: Diagnosis not present

## 2022-10-20 DIAGNOSIS — F0390 Unspecified dementia without behavioral disturbance: Secondary | ICD-10-CM | POA: Diagnosis not present

## 2022-10-20 DIAGNOSIS — R1310 Dysphagia, unspecified: Secondary | ICD-10-CM | POA: Diagnosis not present

## 2022-10-20 DIAGNOSIS — Z556 Problems related to health literacy: Secondary | ICD-10-CM | POA: Diagnosis not present

## 2022-10-20 DIAGNOSIS — I252 Old myocardial infarction: Secondary | ICD-10-CM | POA: Diagnosis not present

## 2022-10-20 DIAGNOSIS — T796XXD Traumatic ischemia of muscle, subsequent encounter: Secondary | ICD-10-CM | POA: Diagnosis not present

## 2022-10-20 DIAGNOSIS — I2510 Atherosclerotic heart disease of native coronary artery without angina pectoris: Secondary | ICD-10-CM | POA: Diagnosis not present

## 2022-10-26 DIAGNOSIS — F0390 Unspecified dementia without behavioral disturbance: Secondary | ICD-10-CM | POA: Diagnosis not present

## 2022-10-26 DIAGNOSIS — M1990 Unspecified osteoarthritis, unspecified site: Secondary | ICD-10-CM | POA: Diagnosis not present

## 2022-10-26 DIAGNOSIS — Z7902 Long term (current) use of antithrombotics/antiplatelets: Secondary | ICD-10-CM | POA: Diagnosis not present

## 2022-10-26 DIAGNOSIS — Z9181 History of falling: Secondary | ICD-10-CM | POA: Diagnosis not present

## 2022-10-26 DIAGNOSIS — Z556 Problems related to health literacy: Secondary | ICD-10-CM | POA: Diagnosis not present

## 2022-10-26 DIAGNOSIS — T796XXD Traumatic ischemia of muscle, subsequent encounter: Secondary | ICD-10-CM | POA: Diagnosis not present

## 2022-10-26 DIAGNOSIS — I2510 Atherosclerotic heart disease of native coronary artery without angina pectoris: Secondary | ICD-10-CM | POA: Diagnosis not present

## 2022-10-26 DIAGNOSIS — Z87440 Personal history of urinary (tract) infections: Secondary | ICD-10-CM | POA: Diagnosis not present

## 2022-10-26 DIAGNOSIS — E785 Hyperlipidemia, unspecified: Secondary | ICD-10-CM | POA: Diagnosis not present

## 2022-10-26 DIAGNOSIS — R1310 Dysphagia, unspecified: Secondary | ICD-10-CM | POA: Diagnosis not present

## 2022-10-26 DIAGNOSIS — I10 Essential (primary) hypertension: Secondary | ICD-10-CM | POA: Diagnosis not present

## 2022-10-26 DIAGNOSIS — I252 Old myocardial infarction: Secondary | ICD-10-CM | POA: Diagnosis not present

## 2022-10-26 DIAGNOSIS — K219 Gastro-esophageal reflux disease without esophagitis: Secondary | ICD-10-CM | POA: Diagnosis not present

## 2022-10-26 DIAGNOSIS — S42024D Nondisplaced fracture of shaft of right clavicle, subsequent encounter for fracture with routine healing: Secondary | ICD-10-CM | POA: Diagnosis not present

## 2022-11-01 DIAGNOSIS — W19XXXA Unspecified fall, initial encounter: Secondary | ICD-10-CM | POA: Diagnosis not present

## 2022-11-01 DIAGNOSIS — M25552 Pain in left hip: Secondary | ICD-10-CM | POA: Diagnosis not present

## 2022-11-02 ENCOUNTER — Emergency Department (HOSPITAL_COMMUNITY)
Admission: EM | Admit: 2022-11-02 | Discharge: 2022-11-02 | Disposition: A | Discharge status: Home / Self Care | Payer: Medicare Other | Attending: Emergency Medicine | Admitting: Emergency Medicine

## 2022-11-02 ENCOUNTER — Emergency Department (HOSPITAL_COMMUNITY): Payer: Medicare Other

## 2022-11-02 ENCOUNTER — Other Ambulatory Visit: Payer: Self-pay

## 2022-11-02 ENCOUNTER — Encounter (HOSPITAL_COMMUNITY): Payer: Self-pay

## 2022-11-02 DIAGNOSIS — M25552 Pain in left hip: Secondary | ICD-10-CM | POA: Insufficient documentation

## 2022-11-02 DIAGNOSIS — I672 Cerebral atherosclerosis: Secondary | ICD-10-CM | POA: Diagnosis not present

## 2022-11-02 DIAGNOSIS — M25521 Pain in right elbow: Secondary | ICD-10-CM | POA: Diagnosis not present

## 2022-11-02 DIAGNOSIS — W06XXXA Fall from bed, initial encounter: Secondary | ICD-10-CM | POA: Diagnosis not present

## 2022-11-02 DIAGNOSIS — Z7902 Long term (current) use of antithrombotics/antiplatelets: Secondary | ICD-10-CM | POA: Diagnosis not present

## 2022-11-02 DIAGNOSIS — R410 Disorientation, unspecified: Secondary | ICD-10-CM | POA: Diagnosis not present

## 2022-11-02 DIAGNOSIS — W19XXXA Unspecified fall, initial encounter: Secondary | ICD-10-CM

## 2022-11-02 DIAGNOSIS — M160 Bilateral primary osteoarthritis of hip: Secondary | ICD-10-CM | POA: Diagnosis not present

## 2022-11-02 DIAGNOSIS — R456 Violent behavior: Secondary | ICD-10-CM | POA: Diagnosis not present

## 2022-11-02 DIAGNOSIS — I6523 Occlusion and stenosis of bilateral carotid arteries: Secondary | ICD-10-CM | POA: Diagnosis not present

## 2022-11-02 DIAGNOSIS — M25551 Pain in right hip: Secondary | ICD-10-CM | POA: Diagnosis not present

## 2022-11-02 DIAGNOSIS — F0390 Unspecified dementia without behavioral disturbance: Secondary | ICD-10-CM | POA: Insufficient documentation

## 2022-11-02 DIAGNOSIS — Z043 Encounter for examination and observation following other accident: Secondary | ICD-10-CM | POA: Diagnosis not present

## 2022-11-02 DIAGNOSIS — I959 Hypotension, unspecified: Secondary | ICD-10-CM | POA: Diagnosis not present

## 2022-11-02 DIAGNOSIS — Z7401 Bed confinement status: Secondary | ICD-10-CM | POA: Diagnosis not present

## 2022-11-02 LAB — COMPREHENSIVE METABOLIC PANEL
ALT: 19 U/L (ref 0–44)
AST: 19 U/L (ref 15–41)
Albumin: 3.4 g/dL — ABNORMAL LOW (ref 3.5–5.0)
Alkaline Phosphatase: 65 U/L (ref 38–126)
Anion gap: 7 (ref 5–15)
BUN: 23 mg/dL (ref 8–23)
CO2: 26 mmol/L (ref 22–32)
Calcium: 9.3 mg/dL (ref 8.9–10.3)
Chloride: 106 mmol/L (ref 98–111)
Creatinine, Ser: 0.96 mg/dL (ref 0.61–1.24)
GFR, Estimated: >60 mL/min (ref >60)
Glucose, Bld: 115 mg/dL — ABNORMAL HIGH (ref 70–99)
Potassium: 3.7 mmol/L (ref 3.5–5.1)
Sodium: 139 mmol/L (ref 135–145)
Total Bilirubin: 0.6 mg/dL (ref 0.3–1.2)
Total Protein: 5.8 g/dL — ABNORMAL LOW (ref 6.5–8.1)

## 2022-11-02 LAB — I-STAT CHEM 8, ED
BUN: 25 mg/dL — ABNORMAL HIGH (ref 8–23)
Calcium, Ion: 1.27 mmol/L (ref 1.15–1.40)
Chloride: 105 mmol/L (ref 98–111)
Creatinine, Ser: 1 mg/dL (ref 0.61–1.24)
Glucose, Bld: 112 mg/dL — ABNORMAL HIGH (ref 70–99)
HCT: 33 % — ABNORMAL LOW (ref 39.0–52.0)
Hemoglobin: 11.2 g/dL — ABNORMAL LOW (ref 13.0–17.0)
Potassium: 3.8 mmol/L (ref 3.5–5.1)
Sodium: 142 mmol/L (ref 135–145)
TCO2: 25 mmol/L (ref 22–32)

## 2022-11-02 LAB — CBC WITH DIFFERENTIAL/PLATELET
Abs Immature Granulocytes: 0.05 10*3/uL (ref 0.00–0.07)
Basophils Absolute: 0 10*3/uL (ref 0.0–0.1)
Basophils Relative: 1 %
Eosinophils Absolute: 0 10*3/uL (ref 0.0–0.5)
Eosinophils Relative: 0 %
HCT: 35.9 % — ABNORMAL LOW (ref 39.0–52.0)
Hemoglobin: 11.5 g/dL — ABNORMAL LOW (ref 13.0–17.0)
Immature Granulocytes: 1 %
Lymphocytes Relative: 8 %
Lymphs Abs: 0.7 10*3/uL (ref 0.7–4.0)
MCH: 30 pg (ref 26.0–34.0)
MCHC: 32 g/dL (ref 30.0–36.0)
MCV: 93.7 fL (ref 80.0–100.0)
Monocytes Absolute: 0.7 10*3/uL (ref 0.1–1.0)
Monocytes Relative: 8 %
Neutro Abs: 7.1 10*3/uL (ref 1.7–7.7)
Neutrophils Relative %: 82 %
Platelets: 217 10*3/uL (ref 150–400)
RBC: 3.83 MIL/uL — ABNORMAL LOW (ref 4.22–5.81)
RDW: 13.3 % (ref 11.5–15.5)
WBC: 8.6 10*3/uL (ref 4.0–10.5)
nRBC: 0 % (ref 0.0–0.2)

## 2022-11-02 LAB — I-STAT CG4 LACTIC ACID, ED: Lactic Acid, Venous: 1.4 mmol/L (ref 0.5–1.9)

## 2022-11-02 MED ORDER — SODIUM CHLORIDE 0.9 % IV BOLUS
500.0000 mL | Freq: Once | INTRAVENOUS | Status: AC
  Administered 2022-11-02: 500 mL via INTRAVENOUS

## 2022-11-02 NOTE — ED Notes (Signed)
[facility] called for transport.  

## 2022-11-02 NOTE — ED Notes (Signed)
EDP made aware of BP, verbal order for urinalysis and NS bolus

## 2022-11-02 NOTE — ED Notes (Signed)
Trauma Response Nurse Documentation   Carlos [name] is a 82 y.Carlos. male arriving to [facility] ED via EMS  On clopidogrel 75 mg daily. Trauma was activated as a Level 2 by ED charge RN based on the following trauma criteria Elderly patients > 65 with head trauma on anti-coagulation (excluding ASA).  Patient cleared for CT by Dr. Sharilyn Sites PA-C. Pt transported to CT with trauma response nurse present to monitor. RN remained with the patient throughout their absence from the department for clinical observation.   GCS 14 PERRLA 3mm.  History   Past Medical History:  Diagnosis Date   Amnesia 09/29/2021   Angina pectoris (HCC)    CAD (coronary artery disease) 05/14/2013   GERD (gastroesophageal reflux disease)    Hypercholesteremia    Hyperlipidemia 05/14/2013   Osteoarthritis of knee 09/29/2021   Other long term (current) drug therapy 09/29/2021   Poor balance 09/29/2021   Screening for malignant neoplasm of prostate 09/29/2021   ST elevation myocardial infarction (STEMI) of lateral wall, initial episode of care [facility]) 05/12/2013     Past Surgical History:  Procedure Laterality Date   ABDOMINAL SURGERY     CARDIAC CATHETERIZATION     CORONARY ANGIOPLASTY     LEFT HEART CATHETERIZATION WITH CORONARY ANGIOGRAM N/A 05/12/2013   Procedure: LEFT HEART CATHETERIZATION WITH CORONARY ANGIOGRAM;  Surgeon: Micheline Chapman, MD;  Location: [facility] CATH LAB;  Service: Cardiovascular;  Laterality: N/A;   PERCUTANEOUS STENT INTERVENTION N/A 05/12/2013   Procedure: PERCUTANEOUS STENT INTERVENTION;  Surgeon: Micheline Chapman, MD;  Location: [facility] CATH LAB;  Service: Cardiovascular;  Laterality: N/A;       Initial Focused Assessment (If applicable, or please see trauma documentation): Airway patent/unobstructed, BS clear No obvious uncontrolled hemorrhage GCS 14 PERRLA 3mm  Confused at baseline, unwitnessed fall from memory care facility. Complained of left hip and right elbow pain with EMS, no complaints upon  arrival.  CT's Completed:   CT Head and CT C-Spine   Interventions:  Trauma lab draw [place] J collar CT head and cervical spine Portable chest, pelvis and right elbow XRAY  Plan for disposition:  Pending workup  Consults completed:  None at the time of this note  Event Summary: Presents from [facility] memory care after an unwitnessed fall, found on the floor by staff. On plavix. GCS 14 confused at baseline. C/Carlos right elbow pain, left hip pain. No obvious deformities noted, no shortening/rotation, CMS intact to all extremities. EMS collar changed to [place] J for comfort. Portable xrays completed, then escorted to CT by TRN.  MTP Summary (If applicable): NA  Bedside handoff with ED RN Whitney.    Carlos [name] Carlos [name]  Trauma Response RN  Please call TRN at [phone] for further assistance.

## 2022-11-02 NOTE — ED Triage Notes (Signed)
Pt bib [facility] from [place] memory care pt had unwitnessed fall, pt on plavix. Staff heard him fall and walked in seeing him on the floor. Pt has baseline dementia. On scene c/o rt elbow pain and left hip pain. Pt currently has no c/o pain.  Bp- 114/60 Hr-74 95% ra Cbg 114 20g lac

## 2022-11-02 NOTE — ED Provider Notes (Signed)
[place] EMERGENCY DEPARTMENT AT [facility] Provider Note   CSN: [id number] Arrival date & time: 11/02/22  0007     History  Chief Complaint  Patient presents with   Carlos [name] is a 82 y.o. male.  The history is provided by the patient and medical records.   82 year old male with history of dementia, GERD, hyperlipidemia, presenting to the ED after an unwitnessed fall at his nursing facility.  Patient reports his shoe got caught under the edge of the bed causing him to fall.  Staff heard him fall and walked into him immediately.  He was lying on the floor on his side.  No apparent loss of consciousness.  EMS was called and upon their arrival he was complaining of right elbow and left hip pain, however after getting him off the floor this seems to have resolved.  He did not have any acute signs of head trauma with EMS, however is on Plavix.  He was placed in a c-collar as a precaution.  Home Medications Prior to Admission medications   Medication Sig Start Date End Date Taking? Authorizing Provider  b complex vitamins tablet Take 0.5 tablets by mouth once a week.    [provider]  Calcium Carbonate Antacid 400 MG CHEW Chew 800 mg by mouth as needed (heart burn).    [provider]  clopidogrel (PLAVIX) 75 MG tablet Take 1 tablet (75 mg total) by mouth daily. 09/30/21   Baldo Daub, MD  docusate sodium (COLACE) 100 MG capsule Take 1 capsule (100 mg total) by mouth 2 (two) times daily. 08/06/22   Lorin Glass, MD  GLUCOSAMINE-CHONDROITIN PO Take 1 tablet by mouth daily.    [provider]  Multiple Vitamins-Minerals (MULTIVITAMIN WITH MINERALS) tablet Take 0.5 tablets by mouth daily.     [provider]  nitroGLYCERIN (NITROSTAT) 0.4 MG SL tablet Place 1 tablet (0.4 mg total) under the tongue every 5 (five) minutes x 3 doses as needed for chest pain. 09/30/21   Baldo Daub, MD  Omega-3 Fatty Acids (FISH OIL PO) Take 1  capsule by mouth daily.    [provider]  polyethylene glycol (MIRALAX / GLYCOLAX) 17 g packet Take 17 g by mouth daily as needed for mild constipation. 08/06/22   Lorin Glass, MD  QUEtiapine (SEROQUEL) 25 MG tablet Take 1 tablet (25 mg total) by mouth 2 (two) times daily. 08/06/22   Lorin Glass, MD  simvastatin (ZOCOR) 40 MG tablet Take 40 mg by mouth every evening.    [provider]  vitamin E 180 MG (400 UNITS) capsule Take 400 Units by mouth daily.    [provider]      Allergies    Patient has no known allergies.    Review of Systems   Review of Systems  Unable to perform ROS: Dementia    Physical Exam Updated Vital Signs BP 106/67   Pulse 65   Temp 98 F (36.7 C) (Oral)   Resp 13   Ht 5\' 11"  (1.803 m)   Wt 64 kg   SpO2 100%   BMI 19.68 kg/m   Physical Exam Vitals and nursing note reviewed.  Constitutional:      Appearance: He is well-developed.     Comments: Elderly, awake/alert  HENT:     Head: Normocephalic and atraumatic.     Comments: No visible head trauma Eyes:     Conjunctiva/sclera: Conjunctivae normal.  Pupils: Pupils are equal, round, and reactive to light.  Neck:     Comments: C-collar in place Cardiovascular:     Rate and Rhythm: Normal rate and regular rhythm.     Heart sounds: Normal heart sounds.  Pulmonary:     Effort: Pulmonary effort is normal.     Breath sounds: Normal breath sounds. No wheezing or rhonchi.  Abdominal:     General: Bowel sounds are normal.     Palpations: Abdomen is soft.  Musculoskeletal:        General: Normal range of motion.     Comments: Pelvis stable, no appreciable leg shortening, no elicited tenderness Right elbow without noted skin tear, bruising, or deformity, no pain elicited with ROM Left elbow normal  Skin:    General: Skin is warm and dry.  Neurological:     Mental Status: He is alert.     Comments: Awake, alert, able to state name and knows he is at the hospital,  moves extremities on command, no focal deficits     ED Results / Procedures / Treatments   Labs (all labs ordered are listed, but only abnormal results are displayed) Labs Reviewed  CBC WITH DIFFERENTIAL/PLATELET - Abnormal; Notable for the following components:      Result Value   RBC 3.83 (*)    Hemoglobin 11.5 (*)    HCT 35.9 (*)    All other components within normal limits  COMPREHENSIVE METABOLIC PANEL - Abnormal; Notable for the following components:   Glucose, Bld 115 (*)    Total Protein 5.8 (*)    Albumin 3.4 (*)    All other components within normal limits  I-STAT CHEM 8, ED - Abnormal; Notable for the following components:   BUN 25 (*)    Glucose, Bld 112 (*)    Hemoglobin 11.2 (*)    HCT 33.0 (*)    All other components within normal limits  I-STAT CG4 LACTIC ACID, ED    EKG None  Radiology DG Chest Port 1 View  Result Date: 11/02/2022 CLINICAL DATA:  Status post fall. EXAM: PORTABLE CHEST 1 VIEW COMPARISON:  August 11, 2022 FINDINGS: The heart size and mediastinal contours are within normal limits. Both lungs are clear. The visualized skeletal structures are unremarkable. IMPRESSION: No active disease. Electronically Signed   By: Aram Candela M.D.   On: 11/02/2022 01:47   DG Elbow Complete Right  Result Date: 11/02/2022 CLINICAL DATA:  Status post fall. EXAM: RIGHT ELBOW - COMPLETE 3+ VIEW COMPARISON:  None Available. FINDINGS: There is no evidence of acute fracture or dislocation. A very small chronic appearing cortical irregularity is seen involving the lateral aspect of the right radial head. Mild to moderate severity degenerative changes are present. There is no evidence of arthropathy or other focal bone abnormality. Soft tissue structures are unremarkable. IMPRESSION: 1. Mild to moderate severity degenerative changes. 2. Chronic appearing area of cortical irregularity involving the right radial head. Correlation with physical examination is recommended to  exclude the presence of point tenderness. Electronically Signed   By: Aram Candela M.D.   On: 11/02/2022 01:46   DG Pelvis Portable  Result Date: 11/02/2022 CLINICAL DATA:  Status post fall. EXAM: PORTABLE PELVIS 1-2 VIEWS COMPARISON:  August 11, 2022 FINDINGS: There is no evidence of pelvic fracture or diastasis. Moderate to marked severity degenerative changes seen involving both hips in the form of joint space narrowing, subchondral cyst formation and acetabular sclerosis. IMPRESSION: Degenerative changes involving both hips. Electronically  Signed   By: Aram Candela M.D.   On: 11/02/2022 01:43   CT HEAD WO CONTRAST ( )  Result Date: 11/02/2022 CLINICAL DATA:  Fall EXAM: CT HEAD WITHOUT CONTRAST CT CERVICAL SPINE WITHOUT CONTRAST TECHNIQUE: Multidetector CT imaging of the head and cervical spine was performed following the standard protocol without intravenous contrast. Multiplanar CT image reconstructions of the cervical spine were also generated. RADIATION DOSE REDUCTION: This exam was performed according to the departmental dose-optimization program which includes automated exposure control, adjustment of the mA and/or kV according to patient size and/or use of iterative reconstruction technique. COMPARISON:  None Available. FINDINGS: CT HEAD FINDINGS Brain: There is no mass, hemorrhage or extra-axial collection. The size and configuration of the ventricles and extra-axial CSF spaces are normal. There is hypoattenuation of the periventricular white matter, most commonly indicating chronic ischemic microangiopathy. Vascular: Atherosclerotic calcification of the vertebral and internal carotid arteries at the skull base. No abnormal hyperdensity of the major intracranial arteries or dural venous sinuses. Skull: The visualized skull base, calvarium and extracranial soft tissues are normal. Sinuses/Orbits: No fluid levels or advanced mucosal thickening of the visualized paranasal sinuses. No  mastoid or middle ear effusion. The orbits are normal. CT CERVICAL SPINE FINDINGS Alignment: No static subluxation. Facets are aligned. Occipital condyles are normally positioned. Skull base and vertebrae: No acute fracture. Soft tissues and spinal canal: No prevertebral fluid or swelling. No visible canal hematoma. Disc levels: No advanced spinal canal or neural foraminal stenosis. Upper chest: No pneumothorax, pulmonary nodule or pleural effusion. Other: Normal visualized paraspinal cervical soft tissues. IMPRESSION: 1. No acute intracranial abnormality. 2. Chronic ischemic microangiopathy. 3. No acute fracture or static subluxation of the cervical spine. Electronically Signed   By: Deatra Robinson M.D.   On: 11/02/2022 01:23   CT Cervical Spine Wo Contrast  Result Date: 11/02/2022 CLINICAL DATA:  Fall EXAM: CT HEAD WITHOUT CONTRAST CT CERVICAL SPINE WITHOUT CONTRAST TECHNIQUE: Multidetector CT imaging of the head and cervical spine was performed following the standard protocol without intravenous contrast. Multiplanar CT image reconstructions of the cervical spine were also generated. RADIATION DOSE REDUCTION: This exam was performed according to the departmental dose-optimization program which includes automated exposure control, adjustment of the mA and/or kV according to patient size and/or use of iterative reconstruction technique. COMPARISON:  None Available. FINDINGS: CT HEAD FINDINGS Brain: There is no mass, hemorrhage or extra-axial collection. The size and configuration of the ventricles and extra-axial CSF spaces are normal. There is hypoattenuation of the periventricular white matter, most commonly indicating chronic ischemic microangiopathy. Vascular: Atherosclerotic calcification of the vertebral and internal carotid arteries at the skull base. No abnormal hyperdensity of the major intracranial arteries or dural venous sinuses. Skull: The visualized skull base, calvarium and extracranial soft  tissues are normal. Sinuses/Orbits: No fluid levels or advanced mucosal thickening of the visualized paranasal sinuses. No mastoid or middle ear effusion. The orbits are normal. CT CERVICAL SPINE FINDINGS Alignment: No static subluxation. Facets are aligned. Occipital condyles are normally positioned. Skull base and vertebrae: No acute fracture. Soft tissues and spinal canal: No prevertebral fluid or swelling. No visible canal hematoma. Disc levels: No advanced spinal canal or neural foraminal stenosis. Upper chest: No pneumothorax, pulmonary nodule or pleural effusion. Other: Normal visualized paraspinal cervical soft tissues. IMPRESSION: 1. No acute intracranial abnormality. 2. Chronic ischemic microangiopathy. 3. No acute fracture or static subluxation of the cervical spine. Electronically Signed   By: Deatra Robinson M.D.   On: 11/02/2022 01:23  Procedures Procedures    Medications Ordered in ED Medications  sodium chloride 0.9 % bolus 500 mL (0 mLs Intravenous Stopped 11/02/22 0150)    ED Course/ Medical Decision Making/ A&P                                 Medical Decision Making Amount and/or Complexity of Data Reviewed Labs: ordered. Radiology: ordered and independent interpretation performed. ECG/medicine tests: ordered and independent interpretation performed.   82 year old male presenting to the ED as a level 2 fall on thinners.  Had unwitnessed fall at nursing facility but staff evaluated immediately afterwards.  It sounds like his shoe got caught under the corner of the bed.  No clear head injury or loss of consciousness.  He is awake and alert on arrival.  Facility reports he is at his baseline mental status.  He does not have any visible signs of head trauma.  Complained of right elbow and left hip pain initially but resolved once he was out of the floor.  Will obtain screening x-rays, basic labs.  Labs without any leukocytosis or electrolyte derangement.  CT head/neck negative  for acute findings.  Chest and pelvic films negative.  Elbow films with findings of chronic appearing irregularity at radial head.  Patient reassessed, denies any elbow pain at present.  He has not pain with deep palpation or ROM of right elbow.  Remains without swelling or deformity.  Suspect this is likely chronic.   Results discussed with family at bedside, feel he is stable for discharge back to facility.  Follow-up with PCP.  Return here for new concerns.  Final Clinical Impression(s) / ED Diagnoses Final diagnoses:  Fall, initial encounter    Rx / DC Orders ED Discharge Orders     None         Garlon Hatchet, PA-C 11/02/22 0331    Shon Baton, MD 11/02/22 [phone]

## 2022-11-02 NOTE — Discharge Instructions (Signed)
Imaging today did not show any acute injuries. Labs were reassuring. Can follow-up with primary care doctor. Return here for new concerns.

## 2022-11-02 NOTE — Progress Notes (Signed)
   11/02/22 0000  Spiritual Encounters  Type of Visit Initial  Care provided to: Pt not available  Referral source Trauma page  Reason for visit Trauma  OnCall Visit No   Chaplain responded to a level two trauam. The patient was attended to by the medical team.  No family is present. If a chaplain is requested someone will respond.   Valerie Roys [facility]  [phone]

## 2022-11-02 NOTE — Progress Notes (Signed)
Orthopedic Tech Progress Note Patient Details:  Carlos [name] [birth year] [id number]  Patient ID: [name], male   DOB: 05-Apr-1940, 82 y.o.   MRN: [id number] I attended trauma page. Trinna Post 11/02/2022, 12:19 AM

## 2022-11-02 NOTE — ED Notes (Signed)
Patient transported to CT 

## 2022-11-02 NOTE — ED Notes (Signed)
Attempted to call report x2 to [facility] [place]

## 2022-11-03 DIAGNOSIS — Z87440 Personal history of urinary (tract) infections: Secondary | ICD-10-CM | POA: Diagnosis not present

## 2022-11-03 DIAGNOSIS — M1990 Unspecified osteoarthritis, unspecified site: Secondary | ICD-10-CM | POA: Diagnosis not present

## 2022-11-03 DIAGNOSIS — E785 Hyperlipidemia, unspecified: Secondary | ICD-10-CM | POA: Diagnosis not present

## 2022-11-03 DIAGNOSIS — T796XXD Traumatic ischemia of muscle, subsequent encounter: Secondary | ICD-10-CM | POA: Diagnosis not present

## 2022-11-03 DIAGNOSIS — S42024D Nondisplaced fracture of shaft of right clavicle, subsequent encounter for fracture with routine healing: Secondary | ICD-10-CM | POA: Diagnosis not present

## 2022-11-03 DIAGNOSIS — Z7902 Long term (current) use of antithrombotics/antiplatelets: Secondary | ICD-10-CM | POA: Diagnosis not present

## 2022-11-03 DIAGNOSIS — I2510 Atherosclerotic heart disease of native coronary artery without angina pectoris: Secondary | ICD-10-CM | POA: Diagnosis not present

## 2022-11-03 DIAGNOSIS — R1310 Dysphagia, unspecified: Secondary | ICD-10-CM | POA: Diagnosis not present

## 2022-11-03 DIAGNOSIS — I10 Essential (primary) hypertension: Secondary | ICD-10-CM | POA: Diagnosis not present

## 2022-11-03 DIAGNOSIS — Z556 Problems related to health literacy: Secondary | ICD-10-CM | POA: Diagnosis not present

## 2022-11-03 DIAGNOSIS — I252 Old myocardial infarction: Secondary | ICD-10-CM | POA: Diagnosis not present

## 2022-11-03 DIAGNOSIS — Z9181 History of falling: Secondary | ICD-10-CM | POA: Diagnosis not present

## 2022-11-03 DIAGNOSIS — F0390 Unspecified dementia without behavioral disturbance: Secondary | ICD-10-CM | POA: Diagnosis not present

## 2022-11-03 DIAGNOSIS — K219 Gastro-esophageal reflux disease without esophagitis: Secondary | ICD-10-CM | POA: Diagnosis not present

## 2022-11-04 DIAGNOSIS — I2510 Atherosclerotic heart disease of native coronary artery without angina pectoris: Secondary | ICD-10-CM | POA: Diagnosis not present

## 2022-11-04 DIAGNOSIS — F0390 Unspecified dementia without behavioral disturbance: Secondary | ICD-10-CM | POA: Diagnosis not present

## 2022-11-04 DIAGNOSIS — E785 Hyperlipidemia, unspecified: Secondary | ICD-10-CM | POA: Diagnosis not present

## 2022-11-04 DIAGNOSIS — R1310 Dysphagia, unspecified: Secondary | ICD-10-CM | POA: Diagnosis not present

## 2022-11-04 DIAGNOSIS — Z7902 Long term (current) use of antithrombotics/antiplatelets: Secondary | ICD-10-CM | POA: Diagnosis not present

## 2022-11-04 DIAGNOSIS — K219 Gastro-esophageal reflux disease without esophagitis: Secondary | ICD-10-CM | POA: Diagnosis not present

## 2022-11-04 DIAGNOSIS — T796XXD Traumatic ischemia of muscle, subsequent encounter: Secondary | ICD-10-CM | POA: Diagnosis not present

## 2022-11-04 DIAGNOSIS — I10 Essential (primary) hypertension: Secondary | ICD-10-CM | POA: Diagnosis not present

## 2022-11-04 DIAGNOSIS — M1990 Unspecified osteoarthritis, unspecified site: Secondary | ICD-10-CM | POA: Diagnosis not present

## 2022-11-04 DIAGNOSIS — Z9181 History of falling: Secondary | ICD-10-CM | POA: Diagnosis not present

## 2022-11-04 DIAGNOSIS — Z556 Problems related to health literacy: Secondary | ICD-10-CM | POA: Diagnosis not present

## 2022-11-04 DIAGNOSIS — S42024D Nondisplaced fracture of shaft of right clavicle, subsequent encounter for fracture with routine healing: Secondary | ICD-10-CM | POA: Diagnosis not present

## 2022-11-04 DIAGNOSIS — Z87440 Personal history of urinary (tract) infections: Secondary | ICD-10-CM | POA: Diagnosis not present

## 2022-11-04 DIAGNOSIS — I252 Old myocardial infarction: Secondary | ICD-10-CM | POA: Diagnosis not present

## 2022-11-07 DIAGNOSIS — I252 Old myocardial infarction: Secondary | ICD-10-CM | POA: Diagnosis not present

## 2022-11-07 DIAGNOSIS — E785 Hyperlipidemia, unspecified: Secondary | ICD-10-CM | POA: Diagnosis not present

## 2022-11-07 DIAGNOSIS — M1990 Unspecified osteoarthritis, unspecified site: Secondary | ICD-10-CM | POA: Diagnosis not present

## 2022-11-07 DIAGNOSIS — S42024D Nondisplaced fracture of shaft of right clavicle, subsequent encounter for fracture with routine healing: Secondary | ICD-10-CM | POA: Diagnosis not present

## 2022-11-07 DIAGNOSIS — Z87440 Personal history of urinary (tract) infections: Secondary | ICD-10-CM | POA: Diagnosis not present

## 2022-11-07 DIAGNOSIS — Z7902 Long term (current) use of antithrombotics/antiplatelets: Secondary | ICD-10-CM | POA: Diagnosis not present

## 2022-11-07 DIAGNOSIS — K219 Gastro-esophageal reflux disease without esophagitis: Secondary | ICD-10-CM | POA: Diagnosis not present

## 2022-11-07 DIAGNOSIS — F0390 Unspecified dementia without behavioral disturbance: Secondary | ICD-10-CM | POA: Diagnosis not present

## 2022-11-07 DIAGNOSIS — T796XXD Traumatic ischemia of muscle, subsequent encounter: Secondary | ICD-10-CM | POA: Diagnosis not present

## 2022-11-07 DIAGNOSIS — Z9181 History of falling: Secondary | ICD-10-CM | POA: Diagnosis not present

## 2022-11-07 DIAGNOSIS — R1310 Dysphagia, unspecified: Secondary | ICD-10-CM | POA: Diagnosis not present

## 2022-11-07 DIAGNOSIS — Z556 Problems related to health literacy: Secondary | ICD-10-CM | POA: Diagnosis not present

## 2022-11-07 DIAGNOSIS — I10 Essential (primary) hypertension: Secondary | ICD-10-CM | POA: Diagnosis not present

## 2022-11-07 DIAGNOSIS — I2510 Atherosclerotic heart disease of native coronary artery without angina pectoris: Secondary | ICD-10-CM | POA: Diagnosis not present

## 2022-11-12 DIAGNOSIS — T796XXD Traumatic ischemia of muscle, subsequent encounter: Secondary | ICD-10-CM | POA: Diagnosis not present

## 2022-11-12 DIAGNOSIS — Z9181 History of falling: Secondary | ICD-10-CM | POA: Diagnosis not present

## 2022-11-12 DIAGNOSIS — E785 Hyperlipidemia, unspecified: Secondary | ICD-10-CM | POA: Diagnosis not present

## 2022-11-12 DIAGNOSIS — S42024D Nondisplaced fracture of shaft of right clavicle, subsequent encounter for fracture with routine healing: Secondary | ICD-10-CM | POA: Diagnosis not present

## 2022-11-12 DIAGNOSIS — I10 Essential (primary) hypertension: Secondary | ICD-10-CM | POA: Diagnosis not present

## 2022-11-12 DIAGNOSIS — I252 Old myocardial infarction: Secondary | ICD-10-CM | POA: Diagnosis not present

## 2022-11-12 DIAGNOSIS — Z87440 Personal history of urinary (tract) infections: Secondary | ICD-10-CM | POA: Diagnosis not present

## 2022-11-12 DIAGNOSIS — Z556 Problems related to health literacy: Secondary | ICD-10-CM | POA: Diagnosis not present

## 2022-11-12 DIAGNOSIS — F0390 Unspecified dementia without behavioral disturbance: Secondary | ICD-10-CM | POA: Diagnosis not present

## 2022-11-12 DIAGNOSIS — M1990 Unspecified osteoarthritis, unspecified site: Secondary | ICD-10-CM | POA: Diagnosis not present

## 2022-11-12 DIAGNOSIS — R1310 Dysphagia, unspecified: Secondary | ICD-10-CM | POA: Diagnosis not present

## 2022-11-12 DIAGNOSIS — I2510 Atherosclerotic heart disease of native coronary artery without angina pectoris: Secondary | ICD-10-CM | POA: Diagnosis not present

## 2022-11-12 DIAGNOSIS — Z7902 Long term (current) use of antithrombotics/antiplatelets: Secondary | ICD-10-CM | POA: Diagnosis not present

## 2022-11-12 DIAGNOSIS — K219 Gastro-esophageal reflux disease without esophagitis: Secondary | ICD-10-CM | POA: Diagnosis not present

## 2022-11-15 DIAGNOSIS — S42024D Nondisplaced fracture of shaft of right clavicle, subsequent encounter for fracture with routine healing: Secondary | ICD-10-CM | POA: Diagnosis not present

## 2022-11-15 DIAGNOSIS — K219 Gastro-esophageal reflux disease without esophagitis: Secondary | ICD-10-CM | POA: Diagnosis not present

## 2022-11-15 DIAGNOSIS — F0390 Unspecified dementia without behavioral disturbance: Secondary | ICD-10-CM | POA: Diagnosis not present

## 2022-11-15 DIAGNOSIS — Z7902 Long term (current) use of antithrombotics/antiplatelets: Secondary | ICD-10-CM | POA: Diagnosis not present

## 2022-11-15 DIAGNOSIS — M1990 Unspecified osteoarthritis, unspecified site: Secondary | ICD-10-CM | POA: Diagnosis not present

## 2022-11-15 DIAGNOSIS — I252 Old myocardial infarction: Secondary | ICD-10-CM | POA: Diagnosis not present

## 2022-11-15 DIAGNOSIS — T796XXD Traumatic ischemia of muscle, subsequent encounter: Secondary | ICD-10-CM | POA: Diagnosis not present

## 2022-11-15 DIAGNOSIS — Z9181 History of falling: Secondary | ICD-10-CM | POA: Diagnosis not present

## 2022-11-15 DIAGNOSIS — E785 Hyperlipidemia, unspecified: Secondary | ICD-10-CM | POA: Diagnosis not present

## 2022-11-15 DIAGNOSIS — Z556 Problems related to health literacy: Secondary | ICD-10-CM | POA: Diagnosis not present

## 2022-11-15 DIAGNOSIS — I10 Essential (primary) hypertension: Secondary | ICD-10-CM | POA: Diagnosis not present

## 2022-11-15 DIAGNOSIS — I2510 Atherosclerotic heart disease of native coronary artery without angina pectoris: Secondary | ICD-10-CM | POA: Diagnosis not present

## 2022-11-15 DIAGNOSIS — Z87440 Personal history of urinary (tract) infections: Secondary | ICD-10-CM | POA: Diagnosis not present

## 2022-11-15 DIAGNOSIS — R1310 Dysphagia, unspecified: Secondary | ICD-10-CM | POA: Diagnosis not present

## 2022-11-22 DIAGNOSIS — I2510 Atherosclerotic heart disease of native coronary artery without angina pectoris: Secondary | ICD-10-CM | POA: Diagnosis not present

## 2022-11-22 DIAGNOSIS — I252 Old myocardial infarction: Secondary | ICD-10-CM | POA: Diagnosis not present

## 2022-11-22 DIAGNOSIS — S42024D Nondisplaced fracture of shaft of right clavicle, subsequent encounter for fracture with routine healing: Secondary | ICD-10-CM | POA: Diagnosis not present

## 2022-11-22 DIAGNOSIS — F0390 Unspecified dementia without behavioral disturbance: Secondary | ICD-10-CM | POA: Diagnosis not present

## 2022-11-22 DIAGNOSIS — Z556 Problems related to health literacy: Secondary | ICD-10-CM | POA: Diagnosis not present

## 2022-11-22 DIAGNOSIS — T796XXD Traumatic ischemia of muscle, subsequent encounter: Secondary | ICD-10-CM | POA: Diagnosis not present

## 2022-11-22 DIAGNOSIS — Z7902 Long term (current) use of antithrombotics/antiplatelets: Secondary | ICD-10-CM | POA: Diagnosis not present

## 2022-11-22 DIAGNOSIS — Z87440 Personal history of urinary (tract) infections: Secondary | ICD-10-CM | POA: Diagnosis not present

## 2022-11-22 DIAGNOSIS — K219 Gastro-esophageal reflux disease without esophagitis: Secondary | ICD-10-CM | POA: Diagnosis not present

## 2022-11-22 DIAGNOSIS — M1990 Unspecified osteoarthritis, unspecified site: Secondary | ICD-10-CM | POA: Diagnosis not present

## 2022-11-22 DIAGNOSIS — E785 Hyperlipidemia, unspecified: Secondary | ICD-10-CM | POA: Diagnosis not present

## 2022-11-22 DIAGNOSIS — R1310 Dysphagia, unspecified: Secondary | ICD-10-CM | POA: Diagnosis not present

## 2022-11-22 DIAGNOSIS — Z9181 History of falling: Secondary | ICD-10-CM | POA: Diagnosis not present

## 2022-11-22 DIAGNOSIS — I10 Essential (primary) hypertension: Secondary | ICD-10-CM | POA: Diagnosis not present

## 2022-11-30 DIAGNOSIS — S42024D Nondisplaced fracture of shaft of right clavicle, subsequent encounter for fracture with routine healing: Secondary | ICD-10-CM | POA: Diagnosis not present

## 2022-11-30 DIAGNOSIS — I252 Old myocardial infarction: Secondary | ICD-10-CM | POA: Diagnosis not present

## 2022-11-30 DIAGNOSIS — Z7902 Long term (current) use of antithrombotics/antiplatelets: Secondary | ICD-10-CM | POA: Diagnosis not present

## 2022-11-30 DIAGNOSIS — T796XXD Traumatic ischemia of muscle, subsequent encounter: Secondary | ICD-10-CM | POA: Diagnosis not present

## 2022-11-30 DIAGNOSIS — M1990 Unspecified osteoarthritis, unspecified site: Secondary | ICD-10-CM | POA: Diagnosis not present

## 2022-11-30 DIAGNOSIS — Z556 Problems related to health literacy: Secondary | ICD-10-CM | POA: Diagnosis not present

## 2022-11-30 DIAGNOSIS — E785 Hyperlipidemia, unspecified: Secondary | ICD-10-CM | POA: Diagnosis not present

## 2022-11-30 DIAGNOSIS — F0390 Unspecified dementia without behavioral disturbance: Secondary | ICD-10-CM | POA: Diagnosis not present

## 2022-11-30 DIAGNOSIS — Z87440 Personal history of urinary (tract) infections: Secondary | ICD-10-CM | POA: Diagnosis not present

## 2022-11-30 DIAGNOSIS — R1310 Dysphagia, unspecified: Secondary | ICD-10-CM | POA: Diagnosis not present

## 2022-11-30 DIAGNOSIS — Z9181 History of falling: Secondary | ICD-10-CM | POA: Diagnosis not present

## 2022-11-30 DIAGNOSIS — I10 Essential (primary) hypertension: Secondary | ICD-10-CM | POA: Diagnosis not present

## 2022-11-30 DIAGNOSIS — I2510 Atherosclerotic heart disease of native coronary artery without angina pectoris: Secondary | ICD-10-CM | POA: Diagnosis not present

## 2022-11-30 DIAGNOSIS — K219 Gastro-esophageal reflux disease without esophagitis: Secondary | ICD-10-CM | POA: Diagnosis not present

## 2022-12-04 ENCOUNTER — Encounter (HOSPITAL_COMMUNITY): Payer: Self-pay

## 2022-12-04 ENCOUNTER — Emergency Department (HOSPITAL_COMMUNITY): Payer: Medicare Other

## 2022-12-04 ENCOUNTER — Other Ambulatory Visit: Payer: Self-pay

## 2022-12-04 ENCOUNTER — Emergency Department (HOSPITAL_COMMUNITY)
Admission: EM | Admit: 2022-12-04 | Discharge: 2022-12-04 | Disposition: A | Discharge status: Skilled Nursing Facility | Payer: Medicare Other | Attending: Emergency Medicine | Admitting: Emergency Medicine

## 2022-12-04 DIAGNOSIS — R9431 Abnormal electrocardiogram [ECG] [EKG]: Secondary | ICD-10-CM | POA: Diagnosis not present

## 2022-12-04 DIAGNOSIS — F03C0 Unspecified dementia, severe, without behavioral disturbance, psychotic disturbance, mood disturbance, and anxiety: Secondary | ICD-10-CM | POA: Diagnosis not present

## 2022-12-04 DIAGNOSIS — Z7902 Long term (current) use of antithrombotics/antiplatelets: Secondary | ICD-10-CM | POA: Insufficient documentation

## 2022-12-04 DIAGNOSIS — R531 Weakness: Secondary | ICD-10-CM | POA: Diagnosis not present

## 2022-12-04 DIAGNOSIS — W19XXXA Unspecified fall, initial encounter: Secondary | ICD-10-CM | POA: Insufficient documentation

## 2022-12-04 DIAGNOSIS — F0390 Unspecified dementia without behavioral disturbance: Secondary | ICD-10-CM | POA: Diagnosis not present

## 2022-12-04 DIAGNOSIS — M4854XA Collapsed vertebra, not elsewhere classified, thoracic region, initial encounter for fracture: Secondary | ICD-10-CM | POA: Diagnosis not present

## 2022-12-04 DIAGNOSIS — Y92129 Unspecified place in nursing home as the place of occurrence of the external cause: Secondary | ICD-10-CM | POA: Insufficient documentation

## 2022-12-04 DIAGNOSIS — S40011A Contusion of right shoulder, initial encounter: Secondary | ICD-10-CM | POA: Diagnosis not present

## 2022-12-04 DIAGNOSIS — Z043 Encounter for examination and observation following other accident: Secondary | ICD-10-CM | POA: Diagnosis not present

## 2022-12-04 DIAGNOSIS — R519 Headache, unspecified: Secondary | ICD-10-CM | POA: Diagnosis not present

## 2022-12-04 DIAGNOSIS — R6889 Other general symptoms and signs: Secondary | ICD-10-CM | POA: Diagnosis not present

## 2022-12-04 DIAGNOSIS — S4991XA Unspecified injury of right shoulder and upper arm, initial encounter: Secondary | ICD-10-CM | POA: Diagnosis not present

## 2022-12-04 DIAGNOSIS — Z7901 Long term (current) use of anticoagulants: Secondary | ICD-10-CM | POA: Insufficient documentation

## 2022-12-04 DIAGNOSIS — Z7401 Bed confinement status: Secondary | ICD-10-CM | POA: Diagnosis not present

## 2022-12-04 DIAGNOSIS — M542 Cervicalgia: Secondary | ICD-10-CM | POA: Diagnosis not present

## 2022-12-04 DIAGNOSIS — S0990XA Unspecified injury of head, initial encounter: Secondary | ICD-10-CM | POA: Diagnosis not present

## 2022-12-04 DIAGNOSIS — F419 Anxiety disorder, unspecified: Secondary | ICD-10-CM | POA: Insufficient documentation

## 2022-12-04 DIAGNOSIS — S199XXA Unspecified injury of neck, initial encounter: Secondary | ICD-10-CM | POA: Diagnosis not present

## 2022-12-04 LAB — CBG MONITORING, ED: Glucose-Capillary: 108 mg/dL — ABNORMAL HIGH (ref 70–99)

## 2022-12-04 NOTE — ED Triage Notes (Signed)
Pt BIB EMS from [facility] care for an unwitnessed fall w/ no obvious head injury. Complained of right shoulder pain. Pt is on Plavix. VS stable upon arrival.

## 2022-12-04 NOTE — ED Provider Notes (Signed)
[place] EMERGENCY DEPARTMENT AT [facility] Provider Note   CSN: [id number] Arrival date & time: 12/04/22  0746     History  Chief Complaint  Patient presents with   Carlos [name] is a 82 y.o. male.  Patient brought to the emergency department by [facility].  Patient had a unwitnessed fall.  Patient was found on the floor by the facility staff.  Patient has been complaining of pain in his right shoulder since he fell.  Patient is known to have frequent falls.  He is on Plavix.  Staff is unsure if patient struck his head.  He is not believed to have had any loss of consciousness.  On my evaluation patient only complains of pain in his shoulder when I move his arm.  The history is provided by the patient and the EMS personnel. No language interpreter was used.  Fall This is a new problem. The current episode started less than 1 hour ago. Nothing aggravates the symptoms. Nothing relieves the symptoms. He has tried nothing for the symptoms.       Home Medications Prior to Admission medications   Medication Sig Start Date End Date Taking? Authorizing Provider  b complex vitamins tablet Take 0.5 tablets by mouth once a week.    [provider]  Calcium Carbonate Antacid 400 MG CHEW Chew 800 mg by mouth as needed (heart burn).    [provider]  clopidogrel (PLAVIX) 75 MG tablet Take 1 tablet (75 mg total) by mouth daily. 09/30/21   Baldo Daub, MD  docusate sodium (COLACE) 100 MG capsule Take 1 capsule (100 mg total) by mouth 2 (two) times daily. 08/06/22   Lorin Glass, MD  GLUCOSAMINE-CHONDROITIN PO Take 1 tablet by mouth daily.    [provider]  Multiple Vitamins-Minerals (MULTIVITAMIN WITH MINERALS) tablet Take 0.5 tablets by mouth daily.     [provider]  nitroGLYCERIN (NITROSTAT) 0.4 MG SL tablet Place 1 tablet (0.4 mg total) under the tongue every 5 (five) minutes x 3 doses as needed for chest pain.  09/30/21   Baldo Daub, MD  Omega-3 Fatty Acids (FISH OIL PO) Take 1 capsule by mouth daily.    [provider]  polyethylene glycol (MIRALAX / GLYCOLAX) 17 g packet Take 17 g by mouth daily as needed for mild constipation. 08/06/22   Lorin Glass, MD  QUEtiapine (SEROQUEL) 25 MG tablet Take 1 tablet (25 mg total) by mouth 2 (two) times daily. 08/06/22   Lorin Glass, MD  simvastatin (ZOCOR) 40 MG tablet Take 40 mg by mouth every evening.    [provider]  vitamin E 180 MG (400 UNITS) capsule Take 400 Units by mouth daily.    [provider]      Allergies    Patient has no known allergies.    Review of Systems   Review of Systems  All other systems reviewed and are negative.   Physical Exam Updated Vital Signs BP 116/60 (BP Location: Right Arm)   Pulse 62   Temp 98.1 F (36.7 C) (Oral)   Resp 17   Ht 5\' 11"  (1.803 m)   Wt 65 kg   SpO2 100%   BMI 19.99 kg/m  Physical Exam Vitals and nursing note reviewed.  Constitutional:      Appearance: He is well-developed.  HENT:     Head: Normocephalic and atraumatic.     Right Ear: External ear normal.  Left Ear: External ear normal.     Nose: Nose normal.     Mouth/Throat:     Mouth: Mucous membranes are moist.  Cardiovascular:     Rate and Rhythm: Normal rate.  Pulmonary:     Effort: Pulmonary effort is normal.  Abdominal:     General: There is no distension.  Musculoskeletal:        General: Normal range of motion.     Cervical back: Normal range of motion.  Skin:    General: Skin is warm.     Capillary Refill: Capillary refill takes less than 2 seconds.  Neurological:     Mental Status: He is alert and oriented to person, place, and time.  Psychiatric:        Mood and Affect: Mood normal.     ED Results / Procedures / Treatments   Labs (all labs ordered are listed, but only abnormal results are displayed) Labs Reviewed  CBG MONITORING, ED - Abnormal; Notable for the following  components:      Result Value   Glucose-Capillary 108 (*)    All other components within normal limits    EKG EKG Interpretation Date/Time:  Saturday December 04 2022 07:55:59 EDT Ventricular Rate:  74 PR Interval:  171 QRS Duration:  98 QT Interval:  405 QTC Calculation: 450 R Axis:   52  Text Interpretation: Sinus rhythm Nonspecific T abnormalities, lateral leads Minimal ST elevation, inferior leads rbbb resolved since last tracing Confirmed by Linwood Dibbles [phone]) on 12/04/2022 8:02:10 AM  Radiology CT Cervical Spine Wo Contrast  Result Date: 12/04/2022 CLINICAL DATA:  82 year old male status post unwitnessed fall. Pain. EXAM: CT CERVICAL SPINE WITHOUT CONTRAST TECHNIQUE: Multidetector CT imaging of the cervical spine was performed without intravenous contrast. Multiplanar CT image reconstructions were also generated. RADIATION DOSE REDUCTION: This exam was performed according to the departmental dose-optimization program which includes automated exposure control, adjustment of the mA and/or kV according to patient size and/or use of iterative reconstruction technique. COMPARISON:  Cervical spine CT 11/02/2022. FINDINGS: Alignment: Stable and relatively normal cervical lordosis. Cervicothoracic junction alignment is within normal limits. Bilateral posterior element alignment is within normal limits. Skull base and vertebrae: Visualized skull base is intact. No atlanto-occipital dissociation. C1 and C2 appear intact and aligned. No acute osseous abnormality identified. Soft tissues and spinal canal: No prevertebral fluid or swelling. No visible canal hematoma. Negative for age visible noncontrast neck soft tissues, calcified carotid atherosclerosis. Disc levels: Stable and fairly age-appropriate cervical spine degeneration. Upper chest: Stable subtle T2 superior endplate compression, and otherwise visible upper thoracic levels appear intact. Negative lung apices. IMPRESSION: 1. No acute  traumatic injury identified in the cervical spine. 2. Age-appropriate cervical spine degeneration. Electronically Signed   By: Odessa Fleming M.D.   On: 12/04/2022 09:45   CT Head Wo Contrast  Result Date: 12/04/2022 CLINICAL DATA:  82 year old male status post unwitnessed fall. Pain. EXAM: CT HEAD WITHOUT CONTRAST TECHNIQUE: Contiguous axial images were obtained from the base of the skull through the vertex without intravenous contrast. RADIATION DOSE REDUCTION: This exam was performed according to the departmental dose-optimization program which includes automated exposure control, adjustment of the mA and/or kV according to patient size and/or use of iterative reconstruction technique. COMPARISON:  Head CT 11/02/2022. FINDINGS: Brain: Extra-axial CSF appears to be physiologic. Cerebral volume is within normal limits for age. No midline shift, ventriculomegaly, mass effect, evidence of mass lesion, intracranial hemorrhage or evidence of cortically based acute infarction. Gray-white differentiation  within normal limits for age. Vascular: Calcified atherosclerosis at the skull base. No suspicious intracranial vascular hyperdensity. Skull: Stable.  No fracture identified. Sinuses/Orbits: Visualized paranasal sinuses and mastoids are clear. Other: No orbit or scalp soft tissue injury identified. IMPRESSION: 1. No acute traumatic injury identified. 2. Stable and negative for age noncontrast CT appearance of the brain. Electronically Signed   By: Odessa Fleming M.D.   On: 12/04/2022 09:41   DG Chest 2 View  Result Date: 12/04/2022 CLINICAL DATA:  Larey Seat EXAM: CHEST - 2 VIEW COMPARISON:  11/02/2022 FINDINGS: Lungs are clear.  No pneumothorax. Heart size and mediastinal contours are within normal limits. Aortic Atherosclerosis (ICD10-170.0). No effusion. Visualized bones unremarkable. IMPRESSION: No acute cardiopulmonary disease. Electronically Signed   By: Corlis Leak M.D.   On: 12/04/2022 08:59   DG Shoulder Right  Result  Date: 12/04/2022 CLINICAL DATA:  Larey Seat EXAM: RIGHT SHOULDER - 2+ VIEW COMPARISON:  None Available. FINDINGS: No fracture or dislocation.  Normal mineralization and alignment. IMPRESSION: Negative. Electronically Signed   By: Corlis Leak M.D.   On: 12/04/2022 08:58    Procedures Procedures    Medications Ordered in ED Medications - No data to display  ED Course/ Medical Decision Making/ A&P                                 Medical Decision Making Patient had a unwitnessed fall at the facility where he resides.  Patient has been complaining of pain to his right shoulder  Amount and/or Complexity of Data Reviewed Independent Historian: EMS    Details: EMS reports staff did not feel patient had hit his head but he has been complaining of right shoulder pain External Data Reviewed: notes.    Details: Review his ED notes reviewed facility notes reviewed Radiology: ordered and independent interpretation performed. Decision-making details documented in ED Course.    Details: CT head and CT cervical spine show no evidence of injury x-ray right shoulder and x-ray chest shows no evidence of fracture.  Risk Risk Details: Patient awake and alert, he moves all extremities.           Final Clinical Impression(s) / ED Diagnoses Final diagnoses:  Contusion of right shoulder, initial encounter  Fall, initial encounter  Severe dementia, unspecified dementia type, unspecified whether behavioral, psychotic, or mood disturbance or anxiety (HCC)    Rx / DC Orders ED Discharge Orders     None      An After Visit Summary was printed and given to the patient.    Elson Areas, PA-C 12/04/22 1000    Linwood Dibbles, MD 12/04/22 1016

## 2022-12-07 DIAGNOSIS — S42024D Nondisplaced fracture of shaft of right clavicle, subsequent encounter for fracture with routine healing: Secondary | ICD-10-CM | POA: Diagnosis not present

## 2022-12-07 DIAGNOSIS — Z87440 Personal history of urinary (tract) infections: Secondary | ICD-10-CM | POA: Diagnosis not present

## 2022-12-07 DIAGNOSIS — I2510 Atherosclerotic heart disease of native coronary artery without angina pectoris: Secondary | ICD-10-CM | POA: Diagnosis not present

## 2022-12-07 DIAGNOSIS — Z7902 Long term (current) use of antithrombotics/antiplatelets: Secondary | ICD-10-CM | POA: Diagnosis not present

## 2022-12-07 DIAGNOSIS — I252 Old myocardial infarction: Secondary | ICD-10-CM | POA: Diagnosis not present

## 2022-12-07 DIAGNOSIS — I700 Atherosclerosis of aorta: Secondary | ICD-10-CM | POA: Diagnosis not present

## 2022-12-07 DIAGNOSIS — R110 Nausea: Secondary | ICD-10-CM | POA: Diagnosis not present

## 2022-12-07 DIAGNOSIS — R451 Restlessness and agitation: Secondary | ICD-10-CM | POA: Diagnosis not present

## 2022-12-07 DIAGNOSIS — F0390 Unspecified dementia without behavioral disturbance: Secondary | ICD-10-CM | POA: Diagnosis not present

## 2022-12-07 DIAGNOSIS — Z9181 History of falling: Secondary | ICD-10-CM | POA: Diagnosis not present

## 2022-12-07 DIAGNOSIS — T796XXD Traumatic ischemia of muscle, subsequent encounter: Secondary | ICD-10-CM | POA: Diagnosis not present

## 2022-12-07 DIAGNOSIS — I10 Essential (primary) hypertension: Secondary | ICD-10-CM | POA: Diagnosis not present

## 2022-12-07 DIAGNOSIS — Z556 Problems related to health literacy: Secondary | ICD-10-CM | POA: Diagnosis not present

## 2022-12-07 DIAGNOSIS — M1990 Unspecified osteoarthritis, unspecified site: Secondary | ICD-10-CM | POA: Diagnosis not present

## 2022-12-07 DIAGNOSIS — F05 Delirium due to known physiological condition: Secondary | ICD-10-CM | POA: Diagnosis not present

## 2022-12-07 DIAGNOSIS — E785 Hyperlipidemia, unspecified: Secondary | ICD-10-CM | POA: Diagnosis not present

## 2022-12-07 DIAGNOSIS — K219 Gastro-esophageal reflux disease without esophagitis: Secondary | ICD-10-CM | POA: Diagnosis not present

## 2022-12-07 DIAGNOSIS — R1310 Dysphagia, unspecified: Secondary | ICD-10-CM | POA: Diagnosis not present

## 2022-12-08 ENCOUNTER — Inpatient Hospital Stay (HOSPITAL_COMMUNITY)
Admission: EM | Admit: 2022-12-08 | Discharge: 2022-12-13 | DRG: 393 | Disposition: A | Discharge status: Skilled Nursing Facility | Payer: Medicare Other | Source: Skilled Nursing Facility | Attending: Family Medicine | Admitting: Family Medicine

## 2022-12-08 ENCOUNTER — Other Ambulatory Visit: Payer: Self-pay

## 2022-12-08 ENCOUNTER — Emergency Department (HOSPITAL_COMMUNITY): Payer: Medicare Other

## 2022-12-08 ENCOUNTER — Inpatient Hospital Stay (HOSPITAL_COMMUNITY): Payer: Medicare Other

## 2022-12-08 ENCOUNTER — Encounter (HOSPITAL_COMMUNITY): Payer: Self-pay

## 2022-12-08 DIAGNOSIS — R9389 Abnormal findings on diagnostic imaging of other specified body structures: Secondary | ICD-10-CM | POA: Diagnosis not present

## 2022-12-08 DIAGNOSIS — R1111 Vomiting without nausea: Secondary | ICD-10-CM | POA: Diagnosis not present

## 2022-12-08 DIAGNOSIS — R270 Ataxia, unspecified: Secondary | ICD-10-CM | POA: Diagnosis not present

## 2022-12-08 DIAGNOSIS — Z8249 Family history of ischemic heart disease and other diseases of the circulatory system: Secondary | ICD-10-CM | POA: Diagnosis not present

## 2022-12-08 DIAGNOSIS — G9341 Metabolic encephalopathy: Secondary | ICD-10-CM | POA: Diagnosis not present

## 2022-12-08 DIAGNOSIS — K219 Gastro-esophageal reflux disease without esophagitis: Secondary | ICD-10-CM | POA: Diagnosis present

## 2022-12-08 DIAGNOSIS — R4182 Altered mental status, unspecified: Secondary | ICD-10-CM | POA: Diagnosis not present

## 2022-12-08 DIAGNOSIS — Z7902 Long term (current) use of antithrombotics/antiplatelets: Secondary | ICD-10-CM | POA: Diagnosis not present

## 2022-12-08 DIAGNOSIS — R1310 Dysphagia, unspecified: Secondary | ICD-10-CM | POA: Diagnosis present

## 2022-12-08 DIAGNOSIS — F03C4 Unspecified dementia, severe, with anxiety: Secondary | ICD-10-CM | POA: Diagnosis not present

## 2022-12-08 DIAGNOSIS — R110 Nausea: Secondary | ICD-10-CM | POA: Diagnosis not present

## 2022-12-08 DIAGNOSIS — I10 Essential (primary) hypertension: Secondary | ICD-10-CM | POA: Diagnosis not present

## 2022-12-08 DIAGNOSIS — F03C11 Unspecified dementia, severe, with agitation: Secondary | ICD-10-CM | POA: Diagnosis not present

## 2022-12-08 DIAGNOSIS — K56609 Unspecified intestinal obstruction, unspecified as to partial versus complete obstruction: Secondary | ICD-10-CM | POA: Diagnosis not present

## 2022-12-08 DIAGNOSIS — K8689 Other specified diseases of pancreas: Secondary | ICD-10-CM | POA: Diagnosis not present

## 2022-12-08 DIAGNOSIS — T434X5A Adverse effect of butyrophenone and thiothixene neuroleptics, initial encounter: Secondary | ICD-10-CM | POA: Diagnosis not present

## 2022-12-08 DIAGNOSIS — E876 Hypokalemia: Secondary | ICD-10-CM | POA: Insufficient documentation

## 2022-12-08 DIAGNOSIS — I252 Old myocardial infarction: Secondary | ICD-10-CM

## 2022-12-08 DIAGNOSIS — I350 Nonrheumatic aortic (valve) stenosis: Secondary | ICD-10-CM | POA: Diagnosis present

## 2022-12-08 DIAGNOSIS — I2510 Atherosclerotic heart disease of native coronary artery without angina pectoris: Secondary | ICD-10-CM | POA: Diagnosis present

## 2022-12-08 DIAGNOSIS — F03C18 Unspecified dementia, severe, with other behavioral disturbance: Secondary | ICD-10-CM | POA: Diagnosis present

## 2022-12-08 DIAGNOSIS — K4030 Unilateral inguinal hernia, with obstruction, without gangrene, not specified as recurrent: Secondary | ICD-10-CM | POA: Diagnosis not present

## 2022-12-08 DIAGNOSIS — E785 Hyperlipidemia, unspecified: Secondary | ICD-10-CM | POA: Diagnosis present

## 2022-12-08 DIAGNOSIS — Z66 Do not resuscitate: Secondary | ICD-10-CM | POA: Diagnosis not present

## 2022-12-08 DIAGNOSIS — Z79899 Other long term (current) drug therapy: Secondary | ICD-10-CM | POA: Diagnosis not present

## 2022-12-08 DIAGNOSIS — G928 Other toxic encephalopathy: Secondary | ICD-10-CM | POA: Diagnosis not present

## 2022-12-08 DIAGNOSIS — E7800 Pure hypercholesterolemia, unspecified: Secondary | ICD-10-CM | POA: Diagnosis present

## 2022-12-08 DIAGNOSIS — I25118 Atherosclerotic heart disease of native coronary artery with other forms of angina pectoris: Secondary | ICD-10-CM | POA: Diagnosis not present

## 2022-12-08 DIAGNOSIS — R296 Repeated falls: Secondary | ICD-10-CM | POA: Diagnosis not present

## 2022-12-08 DIAGNOSIS — I700 Atherosclerosis of aorta: Secondary | ICD-10-CM | POA: Diagnosis not present

## 2022-12-08 DIAGNOSIS — Z87891 Personal history of nicotine dependence: Secondary | ICD-10-CM | POA: Diagnosis not present

## 2022-12-08 DIAGNOSIS — Z955 Presence of coronary angioplasty implant and graft: Secondary | ICD-10-CM

## 2022-12-08 DIAGNOSIS — I2583 Coronary atherosclerosis due to lipid rich plaque: Secondary | ICD-10-CM | POA: Diagnosis not present

## 2022-12-08 DIAGNOSIS — E782 Mixed hyperlipidemia: Secondary | ICD-10-CM | POA: Diagnosis not present

## 2022-12-08 DIAGNOSIS — Z03822 Encounter for observation for suspected aspirated (inhaled) foreign body ruled out: Secondary | ICD-10-CM | POA: Diagnosis not present

## 2022-12-08 DIAGNOSIS — R531 Weakness: Secondary | ICD-10-CM | POA: Diagnosis not present

## 2022-12-08 DIAGNOSIS — K4090 Unilateral inguinal hernia, without obstruction or gangrene, not specified as recurrent: Secondary | ICD-10-CM | POA: Diagnosis not present

## 2022-12-08 DIAGNOSIS — K5669 Other partial intestinal obstruction: Secondary | ICD-10-CM | POA: Diagnosis not present

## 2022-12-08 DIAGNOSIS — Z4682 Encounter for fitting and adjustment of non-vascular catheter: Secondary | ICD-10-CM | POA: Diagnosis not present

## 2022-12-08 DIAGNOSIS — R2689 Other abnormalities of gait and mobility: Secondary | ICD-10-CM | POA: Diagnosis present

## 2022-12-08 DIAGNOSIS — I672 Cerebral atherosclerosis: Secondary | ICD-10-CM | POA: Diagnosis not present

## 2022-12-08 DIAGNOSIS — Z01818 Encounter for other preprocedural examination: Secondary | ICD-10-CM | POA: Diagnosis not present

## 2022-12-08 DIAGNOSIS — R413 Other amnesia: Secondary | ICD-10-CM | POA: Diagnosis present

## 2022-12-08 DIAGNOSIS — F03911 Unspecified dementia, unspecified severity, with agitation: Secondary | ICD-10-CM | POA: Diagnosis present

## 2022-12-08 DIAGNOSIS — N281 Cyst of kidney, acquired: Secondary | ICD-10-CM | POA: Diagnosis not present

## 2022-12-08 LAB — CBC WITH DIFFERENTIAL/PLATELET
Abs Immature Granulocytes: 0.04 10*3/uL (ref 0.00–0.07)
Basophils Absolute: 0 10*3/uL (ref 0.0–0.1)
Basophils Relative: 0 %
Eosinophils Absolute: 0 10*3/uL (ref 0.0–0.5)
Eosinophils Relative: 0 %
HCT: 42 % (ref 39.0–52.0)
Hemoglobin: 13.6 g/dL (ref 13.0–17.0)
Immature Granulocytes: 0 %
Lymphocytes Relative: 3 %
Lymphs Abs: 0.4 10*3/uL — ABNORMAL LOW (ref 0.7–4.0)
MCH: 30.6 pg (ref 26.0–34.0)
MCHC: 32.4 g/dL (ref 30.0–36.0)
MCV: 94.4 fL (ref 80.0–100.0)
Monocytes Absolute: 0.9 10*3/uL (ref 0.1–1.0)
Monocytes Relative: 8 %
Neutro Abs: 9.9 10*3/uL — ABNORMAL HIGH (ref 1.7–7.7)
Neutrophils Relative %: 89 %
Platelets: 236 10*3/uL (ref 150–400)
RBC: 4.45 MIL/uL (ref 4.22–5.81)
RDW: 13.8 % (ref 11.5–15.5)
WBC: 11.3 10*3/uL — ABNORMAL HIGH (ref 4.0–10.5)
nRBC: 0 % (ref 0.0–0.2)

## 2022-12-08 LAB — URINALYSIS, ROUTINE W REFLEX MICROSCOPIC
Bacteria, UA: NONE SEEN
Bilirubin Urine: NEGATIVE
Glucose, UA: NEGATIVE mg/dL
Ketones, ur: 20 mg/dL — AB
Leukocytes,Ua: NEGATIVE
Nitrite: NEGATIVE
Protein, ur: NEGATIVE mg/dL
RBC / HPF: >50 RBC/hpf (ref 0–5)
Specific Gravity, Urine: 1.027 (ref 1.005–1.030)
pH: 5 (ref 5.0–8.0)

## 2022-12-08 LAB — COMPREHENSIVE METABOLIC PANEL
ALT: 29 U/L (ref 0–44)
AST: 26 U/L (ref 15–41)
Albumin: 4.2 g/dL (ref 3.5–5.0)
Alkaline Phosphatase: 61 U/L (ref 38–126)
Anion gap: 13 (ref 5–15)
BUN: 40 mg/dL — ABNORMAL HIGH (ref 8–23)
CO2: 23 mmol/L (ref 22–32)
Calcium: 9.5 mg/dL (ref 8.9–10.3)
Chloride: 108 mmol/L (ref 98–111)
Creatinine, Ser: 1.01 mg/dL (ref 0.61–1.24)
GFR, Estimated: >60 mL/min (ref >60)
Glucose, Bld: 111 mg/dL — ABNORMAL HIGH (ref 70–99)
Potassium: 4.3 mmol/L (ref 3.5–5.1)
Sodium: 144 mmol/L (ref 135–145)
Total Bilirubin: 1.6 mg/dL — ABNORMAL HIGH (ref 0.3–1.2)
Total Protein: 7.3 g/dL (ref 6.5–8.1)

## 2022-12-08 LAB — CBG MONITORING, ED: Glucose-Capillary: 94 mg/dL (ref 70–99)

## 2022-12-08 MED ORDER — FENTANYL CITRATE PF 50 MCG/ML IJ SOSY: 12.5000 ug | PREFILLED_SYRINGE | INTRAMUSCULAR | Status: DC | PRN

## 2022-12-08 MED ORDER — TRAZODONE HCL 50 MG PO TABS
25.0000 mg | ORAL_TABLET | Freq: Every evening | ORAL | Status: DC | PRN
  Administered 2022-12-09 – 2022-12-13 (×2): 25 mg via ORAL
  Filled 2022-12-08 (×2): qty 1

## 2022-12-08 MED ORDER — ALBUTEROL SULFATE (2.5 MG/3ML) 0.083% IN NEBU: 2.5000 mg | INHALATION_SOLUTION | RESPIRATORY_TRACT | Status: DC | PRN

## 2022-12-08 MED ORDER — QUETIAPINE FUMARATE 50 MG PO TABS
25.0000 mg | ORAL_TABLET | Freq: Every day | ORAL | Status: DC
  Administered 2022-12-09 – 2022-12-12 (×4): 25 mg via ORAL
  Filled 2022-12-08 (×5): qty 1

## 2022-12-08 MED ORDER — ENOXAPARIN SODIUM 40 MG/0.4ML IJ SOSY
40.0000 mg | PREFILLED_SYRINGE | Freq: Every day | INTRAMUSCULAR | Status: DC
  Administered 2022-12-09 – 2022-12-12 (×5): 40 mg via SUBCUTANEOUS
  Filled 2022-12-08 (×5): qty 0.4

## 2022-12-08 MED ORDER — HALOPERIDOL LACTATE 5 MG/ML IJ SOLN
5.0000 mg | Freq: Once | INTRAMUSCULAR | Status: AC
  Administered 2022-12-08: 5 mg via INTRAVENOUS
  Filled 2022-12-08: qty 1

## 2022-12-08 MED ORDER — ACETAMINOPHEN 650 MG RE SUPP: 650.0000 mg | Freq: Four times a day (QID) | RECTAL | Status: DC | PRN

## 2022-12-08 MED ORDER — OXYCODONE HCL 5 MG PO TABS
5.0000 mg | ORAL_TABLET | ORAL | Status: DC | PRN
  Filled 2022-12-08: qty 1

## 2022-12-08 MED ORDER — IOHEXOL 300 MG/ML  SOLN
100.0000 mL | Freq: Once | INTRAMUSCULAR | Status: AC | PRN
  Administered 2022-12-08: 100 mL via INTRAVENOUS

## 2022-12-08 MED ORDER — ONDANSETRON HCL 4 MG/2ML IJ SOLN: 4.0000 mg | Freq: Four times a day (QID) | INTRAMUSCULAR | Status: DC | PRN

## 2022-12-08 MED ORDER — LACTATED RINGERS IV BOLUS
1000.0000 mL | Freq: Three times a day (TID) | INTRAVENOUS | Status: DC | PRN
  Administered 2022-12-08: 1000 mL via INTRAVENOUS

## 2022-12-08 MED ORDER — ONDANSETRON HCL 4 MG PO TABS: 4.0000 mg | ORAL_TABLET | Freq: Four times a day (QID) | ORAL | Status: DC | PRN

## 2022-12-08 MED ORDER — SODIUM CHLORIDE 0.9 % IV SOLN: INTRAVENOUS | Status: DC

## 2022-12-08 MED ORDER — ACETAMINOPHEN 325 MG PO TABS
650.0000 mg | ORAL_TABLET | Freq: Four times a day (QID) | ORAL | Status: DC | PRN
  Filled 2022-12-08: qty 2

## 2022-12-08 MED ORDER — LACTATED RINGERS IV BOLUS: 1000.0000 mL | Freq: Once | INTRAVENOUS | Status: DC

## 2022-12-08 NOTE — ED Triage Notes (Signed)
Patient brought in by EMS due to weakness, nausea and vomiting. Per EMS, patient was seen a few days ago for falls, facility reports patient has had continuous falls since returning. EMS reports his emesis looks like orange/brownish phlegm.

## 2022-12-08 NOTE — Consult Note (Signed)
Carlos [name]  [birth year] [id number]  CARE TEAM:  PCP: Carlos Floro, MD  Outpatient Care Team: Patient Care Team: Carlos Floro, MD as PCP - General (Family Medicine)  Inpatient Treatment Team: Treatment Team:  Carlos Gottron, MD Carlos Belt, RN Carlos [name], NT [facility], Md, MD Carlos Hopping, MD   This patient is a 82 y.o.male who presents today for surgical evaluation at the request of Carlos [name], [facility] ED.   Chief complaint / Reason for evaluation: SBO with LIH  82 year old male with multiple medical problems.  Has significant dementia and lives at memory care facility.  His niece, Carlos [name] is legal guardian.  Patient has balance issues.  Coronary artery disease with prior STEMI in 2015.  Angina.  Hyperlipidemia.  Does have some intermittent agitation and belligerence but is better at the memory facility.  Patient having worsening agitation confusion and falls for the past few days.  Went to the emergency department 4 days ago.  Contusion noted but seems stable enough to go back.  Had persistent problems.  Developed nausea and vomiting yesterday.  Worsening.  Brought to emergency department.  Feculent emesis.  NG tube placed with large volume return.  CAT scan done with but I feel it is going very dilated bowel and left inguinal hernia containing bowel.  Left groin with soft but large mass not reducible.  Surgical consultation requested.  Patient cannot provide history.  Patient's niece was there when I came in and she explained situation.  He has some agitation and anxiety.  Switch from lorazepam to Seroquel.  Has some constipation issues but nothing too severe.  Usually rather functional and active.  Followed by Carlos. Dulce [name] with cardiology.  She thinks he has had maybe spleen surgery in the past -bleeding present on CAT scan and no obvious incisions noted.   Assessment  Carlos [name]  82 y.o. male       Problem List:  Principal Problem:   SBO  (small bowel obstruction) (HCC)   Small bowel obstruction.    Left inguinal hernia initially incarcerated but I was able to reduce at bedside.  Plan:  NG tube decompression.  He is very agitated wants to pull it out.  And has feculent output in the canister is nearly full.  I worry about aspiration or perforation if he pulls it out soon which is most likely very likely.  If he has bowel movements and flatus, can trial clamping trials and removing advance.  Because he has only been vomiting for the past 24 hours and has no evidence of any perforation or ischemia, hopefully he will turn around quickly.  Hard to say in an 82 year old demented patient  Mittens and pharmacological therapy for his delirium on top of dementia.  Carlos. Kirby [name] at bedside discussing with the patient's niece who is legal guardian.  IV fluid resuscitation.  Elevated BUN concerning for dehydration but no frank renal failure at this time.  I was able to gradually reduce the left inguinal hernia.  He tried to pull at me and cursed at first but after sharp pain of reduction seems to be more calm now.  No need antibiotics from a surgery standpoint.  Management of his other numerous medical problems per primary service.  Agree with CT scan of head and other workup for his recurrent falls  Patient is technically DNR but niece wishes to proceed with emergency surgery should that be needed.  Hopefully with reduction that is  less likely.  Standard of care is to consider repair of inguinal hernias to avoid these episodes of incarceration/strangulation, along worsening pain discomfort.  While I think it to be technically feasible to do it any be a decent candidate for a minimally invasive preperitoneal approach, the biggest issue is that his severe dementia and memory loss makes me concerned.  Patient will need general anesthesia.  He is at risk for delirium and mental worsening of his dementia because of the need for general  anesthesia.  It is a shorter case & in my experience is usually not a long-term issue, but the literature does caution that he can lose ground.  Do not know what this patient's quality of life is.  However another episode and certainly emergency hernia surgery would be much higher risk. Hard to say.  It would be helpful to know what his baseline functioning is.  Also know why he is having all these falls and make sure that some that something else is going on.  I would definitely want medical and cardiac clearance to assess risks.  He is followed by Carlos. Dulce [name].  Patient's niece considering options.  I would not rush to fix right now anyway.  Carlos. Derrell [name] with surgery on-call tonight updated.  Surgery team will help follow.  -VTE prophylaxis- SCDs, etc  -mobilize as tolerated to help recovery  I reviewed nursing notes, ED provider notes, last 24 h vitals and pain scores, last 48 h intake and output, last 24 h labs and trends, and last 24 h imaging results. I have reviewed this patient's available data, including medical history, events of note, test results, etc as part of my evaluation.  A significant portion of that time was spent in counseling.  Care during the described time interval was provided by me.  This care required high  level of medical decision making.  12/08/2022  Carlos Sportsman, MD, FACS, MASCRS Esophageal, Gastrointestinal & Colorectal Surgery Robotic and Minimally Invasive Surgery  [facility] [address] [place], [place] [address] [phone] Fax [phone] Main  CONTACT INFORMATION: Weekday (9AM-5PM): Call [facility] main office at [phone] Weeknight (5PM-9AM) or Weekend/Holiday: Check EPIC "Web Links" tab & use "AMION" (password " [email or web address]") for General Surgery [facility] coverage  Please, DO NOT use SecureChat  (it is not reliable communication to reach operating surgeons & will lead to a delay in care).   Epic  staff messaging available for outptient concerns needing 1-2 business day response.      12/08/2022      Past Medical History:  Diagnosis Date   Amnesia 09/29/2021   Angina pectoris (HCC)    CAD (coronary artery disease) 05/14/2013   GERD (gastroesophageal reflux disease)    Hypercholesteremia    Hyperlipidemia 05/14/2013   Osteoarthritis of knee 09/29/2021   Other long term (current) drug therapy 09/29/2021   Poor balance 09/29/2021   Screening for malignant neoplasm of prostate 09/29/2021   ST elevation myocardial infarction (STEMI) of lateral wall, initial episode of care [facility]) 05/12/2013    Past Surgical History:  Procedure Laterality Date   ABDOMINAL SURGERY     CARDIAC CATHETERIZATION     CORONARY ANGIOPLASTY     LEFT HEART CATHETERIZATION WITH CORONARY ANGIOGRAM N/A 05/12/2013   Procedure: LEFT HEART CATHETERIZATION WITH CORONARY ANGIOGRAM;  Surgeon: Micheline Chapman, MD;  Location: [facility] CATH LAB;  Service: Cardiovascular;  Laterality: N/A;   PERCUTANEOUS STENT INTERVENTION N/A 05/12/2013  Procedure: PERCUTANEOUS STENT INTERVENTION;  Surgeon: Micheline Chapman, MD;  Location: [facility] CATH LAB;  Service: Cardiovascular;  Laterality: N/A;    Social History   Socioeconomic History   Marital status: Single    Spouse name: Not on file   Number of children: Not on file   Years of education: Not on file   Highest education level: Not on file  Occupational History   Not on file  Tobacco Use   Smoking status: Former   Smokeless tobacco: Former  Advertising account planner   Vaping status: Never Used  Substance and Sexual Activity   Alcohol use: No   Drug use: No   Sexual activity: Not on file  Other Topics Concern   Not on file  Social History Narrative   Not on file   Social Determinants of Health   Financial Resource Strain: Not on file  Food Insecurity: No Food Insecurity (08/01/2022)   Hunger Vital Sign    Worried About Running Out of Food in the Last Year: Never true    Ran Out of Food  in the Last Year: Never true  Transportation Needs: Patient Unable To Answer (08/01/2022)   PRAPARE - Transportation    Lack of Transportation (Medical): Patient unable to answer    Lack of Transportation (Non-Medical): Patient unable to answer  Physical Activity: Not on file  Stress: Not on file  Social Connections: Not on file  Intimate Partner Violence: Patient Unable To Answer (08/01/2022)   Humiliation, Afraid, Rape, and Kick questionnaire    Fear of Current or Ex-Partner: Patient unable to answer    Emotionally Abused: Patient unable to answer    Physically Abused: Patient unable to answer    Sexually Abused: Patient unable to answer    Family History  Problem Relation Age of Onset   Alzheimer's disease Mother    CAD Brother    CAD Brother     Current Facility-Administered Medications  Medication Dose Route Frequency Provider Last Rate Last Admin   0.9 %  sodium chloride infusion   Intravenous Continuous Carlos [name], Mir M, MD       acetaminophen (TYLENOL) tablet 650 mg  650 mg Oral Q6H PRN Carlos [name], Mir M, MD       Or   acetaminophen (TYLENOL) suppository 650 mg  650 mg Rectal Q6H PRN Carlos [name], Mir M, MD       albuterol (PROVENTIL) (2.5 MG/3ML) 0.083% nebulizer solution 2.5 mg  2.5 mg Nebulization Q2H PRN Carlos [name], Mir M, MD       enoxaparin (LOVENOX) injection 40 mg  40 mg Subcutaneous QHS Carlos [name], Mir M, MD       fentaNYL (SUBLIMAZE) injection 12.5-50 mcg  12.5-50 mcg Intravenous Q2H PRN Carlos [name], Mir M, MD       lactated ringers bolus 1,000 mL  1,000 mL Intravenous Q8H PRN Karie Soda, MD 999 mL/hr at 12/08/22 1658 1,000 mL at 12/08/22 1658   lactated ringers bolus 1,000 mL  1,000 mL Intravenous Once Karie Soda, MD       ondansetron (ZOFRAN) tablet 4 mg  4 mg Oral Q6H PRN Carlos [name], Mir M, MD       Or   ondansetron [facility]) injection 4 mg  4 mg Intravenous Q6H PRN Carlos [name], Mir M, MD       oxyCODONE (Oxy IR/ROXICODONE) immediate release tablet 5 mg  5 mg  Oral Q4H PRN Carlos [name], Mir M, MD       QUEtiapine (SEROQUEL) tablet 25 mg  25 mg Oral  BID Carlos [name], Mir M, MD       traZODone (DESYREL) tablet 25 mg  25 mg Oral QHS PRN Carlos Gottron, MD       Current Outpatient Medications  Medication Sig Dispense Refill   b complex vitamins tablet Take 0.5 tablets by mouth once a week.     Calcium Carbonate Antacid 400 MG CHEW Chew 800 mg by mouth as needed (heart burn).     clopidogrel (PLAVIX) 75 MG tablet Take 1 tablet (75 mg total) by mouth daily. 90 tablet 3   docusate sodium (COLACE) 100 MG capsule Take 1 capsule (100 mg total) by mouth 2 (two) times daily. 10 capsule 0   GLUCOSAMINE-CHONDROITIN PO Take 1 tablet by mouth daily.     Multiple Vitamins-Minerals (MULTIVITAMIN WITH MINERALS) tablet Take 0.5 tablets by mouth daily.      nitroGLYCERIN (NITROSTAT) 0.4 MG SL tablet Place 1 tablet (0.4 mg total) under the tongue every 5 (five) minutes x 3 doses as needed for chest pain. 25 tablet 11   Omega-3 Fatty Acids (FISH OIL PO) Take 1 capsule by mouth daily.     polyethylene glycol (MIRALAX / GLYCOLAX) 17 g packet Take 17 g by mouth daily as needed for mild constipation. 14 each 0   QUEtiapine (SEROQUEL) 25 MG tablet Take 1 tablet (25 mg total) by mouth 2 (two) times daily.     simvastatin (ZOCOR) 40 MG tablet Take 40 mg by mouth every evening.     vitamin E 180 MG (400 UNITS) capsule Take 400 Units by mouth daily.       No Known Allergies  ROS:   All other systems reviewed & are negative except per HPI or as noted below: Constitutional:  No fevers, chills, sweats.  Weight stable Psych: Chronic dementia with recent delirium.  1 episode of belligerence but usually consolable especially with family Neuro no major focal deficits but history of falls. Eyes:  No vision changes, No discharge HENT:  No sore throats, nasal drainage Lymph: No neck swelling, No bruising easily Pulmonary:  No cough, productive sputum CV: No orthopnea, PND  Patient  walks 1 block without difficulty.  No exertional chest/neck/shoulder/arm pain.  GI:  No personal nor family history of GI/colon cancer, inflammatory bowel disease, irritable bowel syndrome, allergy such as Celiac Sprue, dietary/dairy problems, colitis, ulcers nor gastritis.  No recent sick contacts/gastroenteritis.  No travel outside the country.  No changes in diet.  Renal: No UTIs, No hematuria Genital:  No drainage, bleeding, masses Musculoskeletal: No severe joint pain.  Good ROM major joints Skin:  No sores or lesions Heme/Lymph:  No easy bleeding.  No swollen lymph nodes   BP 135/70 (BP Location: Left Arm)   Pulse 85   Temp 98.2 F (36.8 C) (Oral)   Resp 18   Ht 5\' 11"  (1.803 m)   Wt 65 kg   SpO2 97%   BMI 19.99 kg/m   Physical Exam:  Constitutional: Not cachectic.  Hygeine adequate.  Vitals signs as above.   Eyes: Pupils reactive, normal extraocular movements. Sclera nonicteric Neuro: CN II-XII intact.  No major focal sensory defects.  No major motor deficits. Lymph: No head/neck/groin lymphadenopathy Psych: Rambling and cursing.  Moderately agitated.  Partially consolable by his niece.  Judgment & insight Impaired, Oriented x2, HENT: Normocephalic, Mucus membranes moist.  No thrush.   Neck: Supple, No tracheal deviation.  No obvious thyromegaly Chest: No pain to chest wall compression.  Good respiratory excursion.  No  audible wheezing CV:  Pulses intact.  regular rhythm.  No major extremity edema  Abdomen:  Flat Hernia: Not present. Diastasis recti: Mild supraumbilical midline. Somewhat firm.   Moderately distended.  Nontender.  No hepatomegaly.  No splenomegaly  Gen:  Inguinal hernia: Moderate left groin bulge going into lower scrotum consistent with hernia.  I was age gradually able to massage and reduce it down.  Some cursing and shouting but feels much more comfortable.  States reduced.  Right side without any major hernia there..  Inguinal lymph nodes: without  lymphadenopathy.    Rectal: (Deferred)  Ext: No obvious deformity or contracture.  Edema: Not present.  No cyanosis Skin: No major subcutaneous nodules.  Warm and dry Musculoskeletal: Severe joint rigidity not present.  No obvious clubbing.  No digital petechiae.     Results:   Labs: Results for orders placed or performed during the hospital encounter of 12/08/22 (from the past 48 hour(s))  CBG monitoring, ED     Status: None   Collection Time: 12/08/22  2:22 PM  Result Value Ref Range   Glucose-Capillary 94 70 - 99 mg/dL    Comment: Glucose reference range applies only to samples taken after fasting for at least 8 hours.  CBC with Differential     Status: Abnormal   Collection Time: 12/08/22  2:28 PM  Result Value Ref Range   WBC 11.3 (H) 4.0 - 10.5 K/uL   RBC 4.45 4.22 - 5.81 MIL/uL   Hemoglobin 13.6 13.0 - 17.0 g/dL   HCT 54.0 98.1 - 19.1 %   MCV 94.4 80.0 - 100.0 fL   MCH 30.6 26.0 - 34.0 pg   MCHC 32.4 30.0 - 36.0 g/dL   RDW 47.8 29.5 - 62.1 %   Platelets 236 150 - 400 K/uL   nRBC 0.0 0.0 - 0.2 %   Neutrophils Relative % 89 %   Neutro Abs 9.9 (H) 1.7 - 7.7 K/uL   Lymphocytes Relative 3 %   Lymphs Abs 0.4 (L) 0.7 - 4.0 K/uL   Monocytes Relative 8 %   Monocytes Absolute 0.9 0.1 - 1.0 K/uL   Eosinophils Relative 0 %   Eosinophils Absolute 0.0 0.0 - 0.5 K/uL   Basophils Relative 0 %   Basophils Absolute 0.0 0.0 - 0.1 K/uL   Immature Granulocytes 0 %   Abs Immature Granulocytes 0.04 0.00 - 0.07 K/uL    Comment: Performed at [facility], [address], [place], [place] [address]  Comprehensive metabolic panel     Status: Abnormal   Collection Time: 12/08/22  2:28 PM  Result Value Ref Range   Sodium 144 135 - 145 mmol/L   Potassium 4.3 3.5 - 5.1 mmol/L   Chloride 108 98 - 111 mmol/L   CO2 23 22 - 32 mmol/L   Glucose, Bld 111 (H) 70 - 99 mg/dL    Comment: Glucose reference range applies only to samples taken after fasting for at least 8 hours.    BUN 40 (H) 8 - 23 mg/dL   Creatinine, Ser 7.84 0.61 - 1.24 mg/dL   Calcium 9.5 8.9 - 69.6 mg/dL   Total Protein 7.3 6.5 - 8.1 g/dL   Albumin 4.2 3.5 - 5.0 g/dL   AST 26 15 - 41 U/L   ALT 29 0 - 44 U/L   Alkaline Phosphatase 61 38 - 126 U/L   Total Bilirubin 1.6 (H) 0.3 - 1.2 mg/dL   GFR, Estimated >29 >52 mL/min  Comment: (NOTE) Calculated using the CKD-EPI Creatinine Equation (2021)    Anion gap 13 5 - 15    Comment: Performed at [facility], [address] [place], [place] [address]  Urinalysis, Routine w reflex microscopic -Urine, Clean Catch     Status: Abnormal   Collection Time: 12/08/22  3:17 PM  Result Value Ref Range   Color, Urine YELLOW YELLOW   APPearance CLEAR CLEAR   Specific Gravity, Urine 1.027 1.005 - 1.030   pH 5.0 5.0 - 8.0   Glucose, UA NEGATIVE NEGATIVE mg/dL   Hgb urine dipstick MODERATE (A) NEGATIVE   Bilirubin Urine NEGATIVE NEGATIVE   Ketones, ur 20 (A) NEGATIVE mg/dL   Protein, ur NEGATIVE NEGATIVE mg/dL   Nitrite NEGATIVE NEGATIVE   Leukocytes,Ua NEGATIVE NEGATIVE   RBC / HPF >50 0 - 5 RBC/hpf   WBC, UA 0-5 0 - 5 WBC/hpf   Bacteria, UA NONE SEEN NONE SEEN   Squamous Epithelial / HPF 0-5 0 - 5 /HPF   Mucus PRESENT     Comment: Performed at [facility], [address] [place], [place] [address]    Imaging / Studies: DG Chest Port 1 View  Result Date: 12/08/2022 CLINICAL DATA:  Altered mental status. EXAM: PORTABLE CHEST 1 VIEW COMPARISON:  Chest radiographs 12/04/2022 and 11/02/2022 FINDINGS: Cardiac silhouette and mediastinal contours are within normal limits. Mild-to-moderate atherosclerotic calcifications within the aortic arch. Mild elevation of left hemidiaphragm, similar to prior. The lungs are clear. No pleural effusion or pneumothorax. Mild dextrocurvature of the midthoracic spine, similar to prior. IMPRESSION: No acute cardiopulmonary disease process. Electronically Signed   By: Neita Garnet M.D.    On: 12/08/2022 17:19   CT Cervical Spine Wo Contrast  Result Date: 12/04/2022 CLINICAL DATA:  82 year old male status post unwitnessed fall. Pain. EXAM: CT CERVICAL SPINE WITHOUT CONTRAST TECHNIQUE: Multidetector CT imaging of the cervical spine was performed without intravenous contrast. Multiplanar CT image reconstructions were also generated. RADIATION DOSE REDUCTION: This exam was performed according to the departmental dose-optimization program which includes automated exposure control, adjustment of the mA and/or kV according to patient size and/or use of iterative reconstruction technique. COMPARISON:  Cervical spine CT 11/02/2022. FINDINGS: Alignment: Stable and relatively normal cervical lordosis. Cervicothoracic junction alignment is within normal limits. Bilateral posterior element alignment is within normal limits. Skull base and vertebrae: Visualized skull base is intact. No atlanto-occipital dissociation. C1 and C2 appear intact and aligned. No acute osseous abnormality identified. Soft tissues and spinal canal: No prevertebral fluid or swelling. No visible canal hematoma. Negative for age visible noncontrast neck soft tissues, calcified carotid atherosclerosis. Disc levels: Stable and fairly age-appropriate cervical spine degeneration. Upper chest: Stable subtle T2 superior endplate compression, and otherwise visible upper thoracic levels appear intact. Negative lung apices. IMPRESSION: 1. No acute traumatic injury identified in the cervical spine. 2. Age-appropriate cervical spine degeneration. Electronically Signed   By: Odessa Fleming M.D.   On: 12/04/2022 09:45   CT Head Wo Contrast  Result Date: 12/04/2022 CLINICAL DATA:  82 year old male status post unwitnessed fall. Pain. EXAM: CT HEAD WITHOUT CONTRAST TECHNIQUE: Contiguous axial images were obtained from the base of the skull through the vertex without intravenous contrast. RADIATION DOSE REDUCTION: This exam was performed according to the  departmental dose-optimization program which includes automated exposure control, adjustment of the mA and/or kV according to patient size and/or use of iterative reconstruction technique. COMPARISON:  Head CT 11/02/2022. FINDINGS: Brain: Extra-axial CSF appears to be  physiologic. Cerebral volume is within normal limits for age. No midline shift, ventriculomegaly, mass effect, evidence of mass lesion, intracranial hemorrhage or evidence of cortically based acute infarction. Gray-white differentiation within normal limits for age. Vascular: Calcified atherosclerosis at the skull base. No suspicious intracranial vascular hyperdensity. Skull: Stable.  No fracture identified. Sinuses/Orbits: Visualized paranasal sinuses and mastoids are clear. Other: No orbit or scalp soft tissue injury identified. IMPRESSION: 1. No acute traumatic injury identified. 2. Stable and negative for age noncontrast CT appearance of the brain. Electronically Signed   By: Odessa Fleming M.D.   On: 12/04/2022 09:41   DG Chest 2 View  Result Date: 12/04/2022 CLINICAL DATA:  Larey Seat EXAM: CHEST - 2 VIEW COMPARISON:  11/02/2022 FINDINGS: Lungs are clear.  No pneumothorax. Heart size and mediastinal contours are within normal limits. Aortic Atherosclerosis (ICD10-170.0). No effusion. Visualized bones unremarkable. IMPRESSION: No acute cardiopulmonary disease. Electronically Signed   By: Corlis Leak M.D.   On: 12/04/2022 08:59   DG Shoulder Right  Result Date: 12/04/2022 CLINICAL DATA:  Larey Seat EXAM: RIGHT SHOULDER - 2+ VIEW COMPARISON:  None Available. FINDINGS: No fracture or dislocation.  Normal mineralization and alignment. IMPRESSION: Negative. Electronically Signed   By: Corlis Leak M.D.   On: 12/04/2022 08:58    Medications / Allergies: per chart  Antibiotics: Anti-infectives (From admission, onward)    None         Note: Portions of this report may have been transcribed using voice recognition software. Every effort was made to  ensure accuracy; however, inadvertent computerized transcription errors may be present.   Any transcriptional errors that result from this process are unintentional.    Carlos Sportsman, MD, FACS, MASCRS Esophageal, Gastrointestinal & Colorectal Surgery Robotic and Minimally Invasive Surgery  [facility] [address] [place], [place] [address] [phone] Fax [phone] Main  CONTACT INFORMATION: Weekday (9AM-5PM): Call [facility] main office at [phone] Weeknight (5PM-9AM) or Weekend/Holiday: Check EPIC "Web Links" tab & use "AMION" (password " [email or web address]") for General Surgery [facility] coverage  Please, DO NOT use SecureChat  (it is not reliable communication to reach operating surgeons & will lead to a delay in care).   Epic staff messaging available for outptient concerns needing 1-2 business day response.       12/08/2022  5:39 PM

## 2022-12-08 NOTE — ED Provider Notes (Signed)
I discussed case with Dr. Kirby Crigler, who will admit.   Pricilla Loveless, MD 12/08/22 [phone]

## 2022-12-08 NOTE — Progress Notes (Signed)
       CROSS COVER NOTE  NAME: Carlos [name] MRN: [id number] DOB : 07-Aug-1940    Date of Service   12/08/2022   HPI/Events of Note   Radiology called to relay results of CT abdomen/pelvis.  Refer to imaging results for complete report. "IMPRESSION: 1. High-grade small-bowel obstruction with transition point in a left inguinal hernia compatible with incarcerated/strangulated hernia. 2. Small amount of free fluid in the right upper quadrant and within the left inguinal hernia. 3. Stable 7 mm hypodensity in the neck of the this can be followed up with nonemergent MRI. 4. Small peripelvic cysts.  No follow-up imaging recommended."   Interventions   Plan: Paged on-call surgeon Axel Filler to relay CT findings.  He reported that patient already had a bedside reduction with Dr. Michaell Cowing.  Recommends continuing current bed plan.       Saintclair Schroader Lamin Geradine Girt, MSN, APRN, AGACNP-BC [facility] [place] Pager: [phone]. Check Amion for Availability

## 2022-12-08 NOTE — ED Provider Notes (Signed)
[place] EMERGENCY DEPARTMENT AT [facility] Provider Note  CSN: [id number] Arrival date & time: 12/08/22 1348  Chief Complaint(s) Weakness  HPI Carlos [name] is a 82 y.o. male with a history of severe dementia who is here today for multiple falls, vomiting.  Patient was seen in the ED on 10/12 after falls.  History is obtained entirely via EMS.   Past Medical History Past Medical History:  Diagnosis Date   Amnesia 09/29/2021   Angina pectoris (HCC)    CAD (coronary artery disease) 05/14/2013   GERD (gastroesophageal reflux disease)    Hypercholesteremia    Hyperlipidemia 05/14/2013   Osteoarthritis of knee 09/29/2021   Other long term (current) drug therapy 09/29/2021   Poor balance 09/29/2021   Screening for malignant neoplasm of prostate 09/29/2021   ST elevation myocardial infarction (STEMI) of lateral wall, initial episode of care (HCC) 05/12/2013   Patient Active Problem List   Diagnosis Date Noted   NSTEMI (non-ST elevated myocardial infarction) (HCC) 08/01/2022   Rhabdomyolysis 08/01/2022   Fall at home, initial encounter 08/01/2022   Dementia without behavioral disturbance (HCC) 08/01/2022   Amnesia 09/29/2021   Osteoarthritis of knee 09/29/2021   Other long term (current) drug therapy 09/29/2021   Poor balance 09/29/2021   Screening for malignant neoplasm of prostate 09/29/2021   Hypercholesteremia    GERD (gastroesophageal reflux disease)    Angina pectoris (HCC)    Hyperlipidemia 05/14/2013   CAD (coronary artery disease) 05/14/2013   ST elevation myocardial infarction (STEMI) of lateral wall, initial episode of care (HCC) 05/12/2013   Home Medication(s) Prior to Admission medications   Medication Sig Start Date End Date Taking? Authorizing Provider  b complex vitamins tablet Take 0.5 tablets by mouth once a week.    [provider]  Calcium Carbonate Antacid 400 MG CHEW Chew 800 mg by mouth as needed (heart burn).    [provider]   clopidogrel (PLAVIX) 75 MG tablet Take 1 tablet (75 mg total) by mouth daily. 09/30/21   Baldo Daub, MD  docusate sodium (COLACE) 100 MG capsule Take 1 capsule (100 mg total) by mouth 2 (two) times daily. 08/06/22   Lorin Glass, MD  GLUCOSAMINE-CHONDROITIN PO Take 1 tablet by mouth daily.    [provider]  Multiple Vitamins-Minerals (MULTIVITAMIN WITH MINERALS) tablet Take 0.5 tablets by mouth daily.     [provider]  nitroGLYCERIN (NITROSTAT) 0.4 MG SL tablet Place 1 tablet (0.4 mg total) under the tongue every 5 (five) minutes x 3 doses as needed for chest pain. 09/30/21   Baldo Daub, MD  Omega-3 Fatty Acids (FISH OIL PO) Take 1 capsule by mouth daily.    [provider]  polyethylene glycol (MIRALAX / GLYCOLAX) 17 g packet Take 17 g by mouth daily as needed for mild constipation. 08/06/22   Lorin Glass, MD  QUEtiapine (SEROQUEL) 25 MG tablet Take 1 tablet (25 mg total) by mouth 2 (two) times daily. 08/06/22   Lorin Glass, MD  simvastatin (ZOCOR) 40 MG tablet Take 40 mg by mouth every evening.    [provider]  vitamin E 180 MG (400 UNITS) capsule Take 400 Units by mouth daily.    [provider]  Past Surgical History Past Surgical History:  Procedure Laterality Date   ABDOMINAL SURGERY     CARDIAC CATHETERIZATION     CORONARY ANGIOPLASTY     LEFT HEART CATHETERIZATION WITH CORONARY ANGIOGRAM N/A 05/12/2013   Procedure: LEFT HEART CATHETERIZATION WITH CORONARY ANGIOGRAM;  Surgeon: Micheline Chapman, MD;  Location: [facility] CATH LAB;  Service: Cardiovascular;  Laterality: N/A;   PERCUTANEOUS STENT INTERVENTION N/A 05/12/2013   Procedure: PERCUTANEOUS STENT INTERVENTION;  Surgeon: Micheline Chapman, MD;  Location: [facility] CATH LAB;  Service: Cardiovascular;  Laterality: N/A;   Family History Family History  Problem  Relation Age of Onset   Alzheimer's disease Mother    CAD Brother    CAD Brother     Social History Social History   Tobacco Use   Smoking status: Former   Smokeless tobacco: Former  Building services engineer status: Never Used  Substance Use Topics   Alcohol use: No   Drug use: No   Allergies Patient has no known allergies.  Review of Systems Review of Systems  Physical Exam Vital Signs  I have reviewed the triage vital signs BP 135/70 (BP Location: Left Arm)   Pulse 85   Temp 98.2 F (36.8 C) (Oral)   Resp 18   Ht 5\' 11"  (1.803 m)   Wt 65 kg   SpO2 97%   BMI 19.99 kg/m   Physical Exam Abdominal:     Palpations: Abdomen is soft.     Hernia: A hernia is present.     Comments: Soft left-sided inguinal hernia.  No duskiness overlying the skin.     ED Results and Treatments Labs (all labs ordered are listed, but only abnormal results are displayed) Labs Reviewed  CBC WITH DIFFERENTIAL/PLATELET - Abnormal; Notable for the following components:      Result Value   WBC 11.3 (*)    Neutro Abs 9.9 (*)    Lymphs Abs 0.4 (*)    All other components within normal limits  COMPREHENSIVE METABOLIC PANEL  URINALYSIS, ROUTINE W REFLEX MICROSCOPIC  CBG MONITORING, ED                                                                                                                          Radiology No results found.  Pertinent labs & imaging results that were available during my care of the patient were reviewed by me and considered in my medical decision making (see MDM for details).  Medications Ordered in ED Medications - No data to display  Procedures Procedures  (including critical care time)  Medical Decision Making / ED Course   This patient presents to the ED for concern of recurrent falls, vomiting, this involves an extensive  number of treatment options, and is a complaint that carries with it a high risk of complications and morbidity.  The differential diagnosis includes intracranial hemorrhage, bowel obstruction, dehydration, metabolic abnormality, dementia.  MDM: 81 year old male here today for vomiting and falls.  Unfortunately, this patient has very severe dementia.  Patient is neither alert and oriented to self or event.  He is able to answer yes and no to me, but I do not believe the understands all my questions are.  He does not indicate any pain or discomfort when I palpate on his upper extremities, chest, abdomen or lower extremities.  I do not observe any obvious trauma.  Will obtain imaging of his head given the repeat falls.  With the vomiting, will obtain imaging of his abdomen.  Basic labs ordered.   I was able to get in touch with the patient's cousin, Jarvis Newcomer as I was not able to get in touch with the patient's guardian Manya Silvas.  Ms. Azucena Cecil is going to attempt to get in touch with Ms. Delford Field for [place].  My review of the patient's CT imaging of the abdomen pelvis looks like a bowel obstruction.  Patient's hernia was not able to be reduced by myself at bedside.  Have reached out to general surgery.  Additional history obtained: -Additional history obtained from EMS -External records from outside source obtained and reviewed including: Chart review including previous notes, labs, imaging, consultation notes   Lab Tests: -I ordered, reviewed, and interpreted labs.   The pertinent results include:   Labs Reviewed  CBC WITH DIFFERENTIAL/PLATELET - Abnormal; Notable for the following components:      Result Value   WBC 11.3 (*)    Neutro Abs 9.9 (*)    Lymphs Abs 0.4 (*)    All other components within normal limits  COMPREHENSIVE METABOLIC PANEL  URINALYSIS, ROUTINE W REFLEX MICROSCOPIC  CBG MONITORING, ED      EKG my independent review of of the patient's EKG shows no ST segment  depressions or elevations, no T wave versions, no evidence of acute ischemia.  Poor tracing.  EKG Interpretation Date/Time:    Ventricular Rate:    PR Interval:    QRS Duration:    QT Interval:    QTC Calculation:   R Axis:      Text Interpretation:           Imaging Studies ordered: I ordered imaging studies including CT imaging of the head, abdomen pelvis. I independently visualized and interpreted imaging. I agree with the radiologist interpretation   Medicines ordered and prescription drug management: No orders of the defined types were placed in this encounter.   -I have reviewed the patients home medicines and have made adjustments as needed  Critical interventions NG tube placement  Consultations Obtained: I requested consultation with the Dr. Michaell Cowing general surgery,  and discussed lab and imaging findings as well as pertinent plan - they recommend: NG tube placement, ice pack on the area.   Cardiac Monitoring: The patient was maintained on a cardiac monitor.  I personally viewed and interpreted the cardiac monitored which showed an underlying rhythm of: Sinus rhythm.  Social Determinants of Health:  Factors impacting patients care include: Nursing home patient, dementia   Reevaluation: After the interventions noted above, I reevaluated  the patient and found that they have :improved  Patient's guardian is at bedside.  She is currently considering whether or not she would want surgery for the patient if it was unable to be reduced.  General surgery recommends admission to hospitalist.  Co morbidities that complicate the patient evaluation  Past Medical History:  Diagnosis Date   Amnesia 09/29/2021   Angina pectoris (HCC)    CAD (coronary artery disease) 05/14/2013   GERD (gastroesophageal reflux disease)    Hypercholesteremia    Hyperlipidemia 05/14/2013   Osteoarthritis of knee 09/29/2021   Other long term (current) drug therapy 09/29/2021   Poor balance  09/29/2021   Screening for malignant neoplasm of prostate 09/29/2021   ST elevation myocardial infarction (STEMI) of lateral wall, initial episode of care [facility]) 05/12/2013      Dispostion: Admission, general surgery consultation.     Final Clinical Impression(s) / ED Diagnoses Final diagnoses:  None     @PCDICTATION @    Anders Simmonds T, DO 12/08/22 1701

## 2022-12-08 NOTE — ED Notes (Signed)
ED TO INPATIENT HANDOFF REPORT  Name/Age/Gender Carlos [name] 82 y.o. male  Code Status    Code Status Orders  (From admission, onward)           Start     Ordered   12/08/22 1718  Full code  Continuous       Question:  By:  Answer:  Consent: discussion documented in EHR   12/08/22 1717           Code Status History     Date Active Date Inactive Code Status Order ID Comments User Context   08/01/2022 1624 08/07/2022 0135 Full Code [id number]  Maryln Gottron, MD ED   05/12/2013 1300 05/15/2013 1554 Full Code [id number]  [name], MD Inpatient       Home/SNF/Other Nursing Home  Chief Complaint SBO (small bowel obstruction) (HCC) [K56.609]  Level of Care/Admitting Diagnosis ED Disposition     ED Disposition  Admit   Condition  --   Comment  Hospital Area: [facility] [[id number]]  Level of Care: Med-Surg [16]  May admit patient to [facility] or [facility] if equivalent level of care is available:: Yes  Covid Evaluation: Asymptomatic - no recent exposure (last 10 days) testing not required  Diagnosis: SBO (small bowel obstruction) [facility]) [[id number]]  Admitting Physician: Maryln Gottron [[id number]]  Attending Physician: Olexa.Dam, MIR Jaxson.Roy [[id number]]  Certification:: I certify this patient will need inpatient services for at least 2 midnights  Expected Medical Readiness: 12/10/2022          Medical History Past Medical History:  Diagnosis Date   Amnesia 09/29/2021   Angina pectoris (HCC)    CAD (coronary artery disease) 05/14/2013   GERD (gastroesophageal reflux disease)    Hypercholesteremia    Hyperlipidemia 05/14/2013   Osteoarthritis of knee 09/29/2021   Other long term (current) drug therapy 09/29/2021   Poor balance 09/29/2021   Screening for malignant neoplasm of prostate 09/29/2021   ST elevation myocardial infarction (STEMI) of lateral wall, initial episode of care (HCC) 05/12/2013    Allergies No Known Allergies  IV  Location/Drains/Wounds Patient Lines/Drains/Airways Status     Active Line/Drains/Airways     Name Placement date Placement time Site Days   Peripheral IV 12/08/22 20 G Anterior;Left;Proximal Forearm 12/08/22  1430  Forearm  less than 1   NG/OG Vented/Dual Lumen 16 Fr. Left nare Marking at nare/corner of mouth 60 cm 12/08/22  1714  Left nare  less than 1            Labs/Imaging Results for orders placed or performed during the hospital encounter of 12/08/22 (from the past 48 hour(s))  CBG monitoring, ED     Status: None   Collection Time: 12/08/22  2:22 PM  Result Value Ref Range   Glucose-Capillary 94 70 - 99 mg/dL    Comment: Glucose reference range applies only to samples taken after fasting for at least 8 hours.  CBC with Differential     Status: Abnormal   Collection Time: 12/08/22  2:28 PM  Result Value Ref Range   WBC 11.3 (H) 4.0 - 10.5 K/uL   RBC 4.45 4.22 - 5.81 MIL/uL   Hemoglobin 13.6 13.0 - 17.0 g/dL   HCT 72.5 36.6 - 44.0 %   MCV 94.4 80.0 - 100.0 fL   MCH 30.6 26.0 - 34.0 pg   MCHC 32.4 30.0 - 36.0 g/dL   RDW 34.7 42.5 - 95.6 %   Platelets 236 150 -  400 K/uL   nRBC 0.0 0.0 - 0.2 %   Neutrophils Relative % 89 %   Neutro Abs 9.9 (H) 1.7 - 7.7 K/uL   Lymphocytes Relative 3 %   Lymphs Abs 0.4 (L) 0.7 - 4.0 K/uL   Monocytes Relative 8 %   Monocytes Absolute 0.9 0.1 - 1.0 K/uL   Eosinophils Relative 0 %   Eosinophils Absolute 0.0 0.0 - 0.5 K/uL   Basophils Relative 0 %   Basophils Absolute 0.0 0.0 - 0.1 K/uL   Immature Granulocytes 0 %   Abs Immature Granulocytes 0.04 0.00 - 0.07 K/uL    Comment: Performed at [facility], [address], [place], [place] [address]  Comprehensive metabolic panel     Status: Abnormal   Collection Time: 12/08/22  2:28 PM  Result Value Ref Range   Sodium 144 135 - 145 mmol/L   Potassium 4.3 3.5 - 5.1 mmol/L   Chloride 108 98 - 111 mmol/L   CO2 23 22 - 32 mmol/L   Glucose, Bld 111 (H) 70 - 99 mg/dL     Comment: Glucose reference range applies only to samples taken after fasting for at least 8 hours.   BUN 40 (H) 8 - 23 mg/dL   Creatinine, Ser 6.04 0.61 - 1.24 mg/dL   Calcium 9.5 8.9 - 54.0 mg/dL   Total Protein 7.3 6.5 - 8.1 g/dL   Albumin 4.2 3.5 - 5.0 g/dL   AST 26 15 - 41 U/L   ALT 29 0 - 44 U/L   Alkaline Phosphatase 61 38 - 126 U/L   Total Bilirubin 1.6 (H) 0.3 - 1.2 mg/dL   GFR, Estimated >98 >11 mL/min    Comment: (NOTE) Calculated using the CKD-EPI Creatinine Equation (2021)    Anion gap 13 5 - 15    Comment: Performed at [facility], [address] [place], [place] [address]  Urinalysis, Routine w reflex microscopic -Urine, Clean Catch     Status: Abnormal   Collection Time: 12/08/22  3:17 PM  Result Value Ref Range   Color, Urine YELLOW YELLOW   APPearance CLEAR CLEAR   Specific Gravity, Urine 1.027 1.005 - 1.030   pH 5.0 5.0 - 8.0   Glucose, UA NEGATIVE NEGATIVE mg/dL   Hgb urine dipstick MODERATE (A) NEGATIVE   Bilirubin Urine NEGATIVE NEGATIVE   Ketones, ur 20 (A) NEGATIVE mg/dL   Protein, ur NEGATIVE NEGATIVE mg/dL   Nitrite NEGATIVE NEGATIVE   Leukocytes,Ua NEGATIVE NEGATIVE   RBC / HPF >50 0 - 5 RBC/hpf   WBC, UA 0-5 0 - 5 WBC/hpf   Bacteria, UA NONE SEEN NONE SEEN   Squamous Epithelial / HPF 0-5 0 - 5 /HPF   Mucus PRESENT     Comment: Performed at [facility], [address], [place], [place] [address]   No results found.  Pending Labs Unresulted Labs (From admission, onward)     Start     Ordered   12/09/22 0500  CBC  Tomorrow morning,   R        12/08/22 1717   12/09/22 0500  Basic metabolic panel  Tomorrow morning,   R        12/08/22 1717            Vitals/Pain Today's Vitals   12/08/22 1359 12/08/22 1400  BP:  135/70  Pulse:  85  Resp:  18  Temp:  98.2 F (36.8 C)  TempSrc:  Oral  SpO2:  97%  Weight: 65 kg   Height: 5\' 11"  (1.803 m)   PainSc: 4      Isolation Precautions No active  isolations  Medications Medications  lactated ringers bolus 1,000 mL (1,000 mLs Intravenous New Bag/Given 12/08/22 1658)  lactated ringers bolus 1,000 mL (has no administration in time range)  QUEtiapine (SEROQUEL) tablet 25 mg (has no administration in time range)  enoxaparin (LOVENOX) injection 40 mg (has no administration in time range)  acetaminophen (TYLENOL) tablet 650 mg (has no administration in time range)    Or  acetaminophen (TYLENOL) suppository 650 mg (has no administration in time range)  traZODone (DESYREL) tablet 25 mg (has no administration in time range)  ondansetron (ZOFRAN) tablet 4 mg (has no administration in time range)    Or  ondansetron (ZOFRAN) injection 4 mg (has no administration in time range)  fentaNYL (SUBLIMAZE) injection 12.5-50 mcg (has no administration in time range)  oxyCODONE (Oxy IR/ROXICODONE) immediate release tablet 5 mg (has no administration in time range)  albuterol (PROVENTIL) (2.5 MG/3ML) 0.083% nebulizer solution 2.5 mg (has no administration in time range)  0.9 %  sodium chloride infusion (has no administration in time range)  iohexol (OMNIPAQUE) 300 MG/ML solution 100 mL (100 mLs Intravenous Contrast Given 12/08/22 1545)  haloperidol lactate (HALDOL) injection 5 mg (5 mg Intravenous Given 12/08/22 1657)    Mobility non-ambulatory

## 2022-12-08 NOTE — H&P (Signed)
History and Physical  DA AUTHEMENT ZOX:[id number] DOB: [birth year] DOA: 12/08/2022  PCP: Daisy Floro, MD   Chief Complaint: abd pain, vomiting   HPI: Carlos [name] is a 82 y.o. male with medical history significant for CAD, GERD, hyperlipidemia, advanced dementia being admitted to the hospital with left inguinal hernia causing bowel obstruction.  History is limited due to the patient's severe dementia, he was recently discharged after a hospital stay to memory care.  It seems that he started vomiting there yesterday.  No other history is provided, though I was able to speak to the patient's legal guardian and niece who is at the bedside.  ED Course: Vital signs in the ER are unremarkable, workup is notable as below for left-sided incarcerated inguinal hernia causing small bowel obstruction.  NG tube was placed.  Patient was seen by Dr. Michaell Cowing in the ER, and he was able to reduce the hernia.  Review of Systems: Please see HPI for pertinent positives and negatives. A complete 10 system review of systems could not be performed due to the patient's condition.  Past Medical History:  Diagnosis Date   Amnesia 09/29/2021   Angina pectoris (HCC)    CAD (coronary artery disease) 05/14/2013   GERD (gastroesophageal reflux disease)    Hypercholesteremia    Hyperlipidemia 05/14/2013   Osteoarthritis of knee 09/29/2021   Other long term (current) drug therapy 09/29/2021   Poor balance 09/29/2021   Screening for malignant neoplasm of prostate 09/29/2021   ST elevation myocardial infarction (STEMI) of lateral wall, initial episode of care [facility]) 05/12/2013   Past Surgical History:  Procedure Laterality Date   ABDOMINAL SURGERY     CARDIAC CATHETERIZATION     CORONARY ANGIOPLASTY     LEFT HEART CATHETERIZATION WITH CORONARY ANGIOGRAM N/A 05/12/2013   Procedure: LEFT HEART CATHETERIZATION WITH CORONARY ANGIOGRAM;  Surgeon: Micheline Chapman, MD;  Location: [facility] CATH LAB;  Service: Cardiovascular;   Laterality: N/A;   PERCUTANEOUS STENT INTERVENTION N/A 05/12/2013   Procedure: PERCUTANEOUS STENT INTERVENTION;  Surgeon: Micheline Chapman, MD;  Location: [facility] CATH LAB;  Service: Cardiovascular;  Laterality: N/A;   Social History:  reports that he has quit smoking. He has quit using smokeless tobacco. He reports that he does not drink alcohol and does not use drugs.  No Known Allergies  Family History  Problem Relation Age of Onset   Alzheimer's disease Mother    CAD Brother    CAD Brother      Prior to Admission medications   Medication Sig Start Date End Date Taking? Authorizing Provider  b complex vitamins tablet Take 0.5 tablets by mouth once a week.    [provider]  Calcium Carbonate Antacid 400 MG CHEW Chew 800 mg by mouth as needed (heart burn).    [provider]  clopidogrel (PLAVIX) 75 MG tablet Take 1 tablet (75 mg total) by mouth daily. 09/30/21   Baldo Daub, MD  docusate sodium (COLACE) 100 MG capsule Take 1 capsule (100 mg total) by mouth 2 (two) times daily. 08/06/22   Lorin Glass, MD  GLUCOSAMINE-CHONDROITIN PO Take 1 tablet by mouth daily.    [provider]  Multiple Vitamins-Minerals (MULTIVITAMIN WITH MINERALS) tablet Take 0.5 tablets by mouth daily.     [provider]  nitroGLYCERIN (NITROSTAT) 0.4 MG SL tablet Place 1 tablet (0.4 mg total) under the tongue every 5 (five) minutes x 3 doses as needed for chest pain. 09/30/21   Baldo Daub,  MD  Omega-3 Fatty Acids (FISH OIL PO) Take 1 capsule by mouth daily.    [provider]  polyethylene glycol (MIRALAX / GLYCOLAX) 17 g packet Take 17 g by mouth daily as needed for mild constipation. 08/06/22   Lorin Glass, MD  QUEtiapine (SEROQUEL) 25 MG tablet Take 1 tablet (25 mg total) by mouth 2 (two) times daily. 08/06/22   Lorin Glass, MD  simvastatin (ZOCOR) 40 MG tablet Take 40 mg by mouth every evening.    [provider]  vitamin E 180 MG (400 UNITS) capsule  Take 400 Units by mouth daily.    [provider]    Physical Exam: BP 135/70 (BP Location: Left Arm)   Pulse 85   Temp 98.2 F (36.8 C) (Oral)   Resp 18   Ht 5\' 11"  (1.803 m)   Wt 65 kg   SpO2 97%   BMI 19.99 kg/m   General: Patient is alert, disoriented, agitated.  Not in any acute distress.  NG tube in place with nearly 1 L of gastric contents.  He is wearing bilateral soft mitts. Cardiovascular: RRR, no murmurs or rubs, no peripheral edema  Respiratory: clear to auscultation bilaterally, no wheezes, no crackles  Abdomen: soft, nontender, nondistended, normal bowel tones heard  Skin: dry, no rashes  Musculoskeletal: no joint effusions, normal range of motion  Psychiatric: Agitated and disoriented Neurologic: extraocular muscles intact, clear speech, moving all extremities with intact sensorium         Labs on Admission:  Basic Metabolic Panel: Recent Labs  Lab 12/08/22 1428  NA 144  K 4.3  CL 108  CO2 23  GLUCOSE 111*  BUN 40*  CREATININE 1.01  CALCIUM 9.5   Liver Function Tests: Recent Labs  Lab 12/08/22 1428  AST 26  ALT 29  ALKPHOS 61  BILITOT 1.6*  PROT 7.3  ALBUMIN 4.2   No results for input(s): "LIPASE", "AMYLASE" in the last 168 hours. No results for input(s): "AMMONIA" in the last 168 hours. CBC: Recent Labs  Lab 12/08/22 1428  WBC 11.3*  NEUTROABS 9.9*  HGB 13.6  HCT 42.0  MCV 94.4  PLT 236   Cardiac Enzymes: No results for input(s): "CKTOTAL", "CKMB", "CKMBINDEX", "TROPONINI" in the last 168 hours.  BNP (last 3 results) No results for input(s): "BNP" in the last 8760 hours.  ProBNP (last 3 results) No results for input(s): "PROBNP" in the last 8760 hours.  CBG: Recent Labs  Lab 12/04/22 0801 12/08/22 1422  GLUCAP 108* 94   Radiological Exams on Admission: No results found.  Assessment/Plan Carlos [name] is a 82 y.o. male with medical history significant for CAD, GERD, hyperlipidemia, advanced dementia being  admitted to the hospital with left inguinal hernia causing bowel obstruction.   Small bowel obstruction-caused by left inguinal hernia, reduced at the bedside by Dr. Michaell Cowing. -Inpatient admission -Continue NG tube for now -Pain and nausea control as needed -IV fluids -Appreciate general surgery consultation and management  Dementia with behavioral disturbance-continue Seroquel 25 mg p.o. twice daily  Agitation-we will plan to use Haldol as needed  DVT prophylaxis: Lovenox   DNR/DNI-confirmed by the patient's niece at the bedside  Consults called: General Surgery Dr. Michaell Cowing  Admission status: The appropriate patient status for this patient is INPATIENT. Inpatient status is judged to be reasonable and necessary in order to provide the required intensity of service to ensure the patient's safety. The patient's presenting symptoms, physical exam findings, and initial radiographic and laboratory  data in the context of their chronic comorbidities is felt to place them at high risk for further clinical deterioration. Furthermore, it is not anticipated that the patient will be medically stable for discharge from the hospital within 2 midnights of admission.    I certify that at the point of admission it is my clinical judgment that the patient will require inpatient hospital care spanning beyond 2 midnights from the point of admission due to high intensity of service, high risk for further deterioration and high frequency of surveillance required  Time spent: 59 minutes  Kegan Mckeithan Sharlette Dense MD [facility] Pager [phone]  If 7PM-7AM, please contact night-coverage [email or web address] Password [email or web address]  12/08/2022, 5:20 PM

## 2022-12-09 DIAGNOSIS — I2583 Coronary atherosclerosis due to lipid rich plaque: Secondary | ICD-10-CM

## 2022-12-09 DIAGNOSIS — Z01818 Encounter for other preprocedural examination: Secondary | ICD-10-CM | POA: Diagnosis not present

## 2022-12-09 DIAGNOSIS — I2510 Atherosclerotic heart disease of native coronary artery without angina pectoris: Secondary | ICD-10-CM | POA: Diagnosis not present

## 2022-12-09 DIAGNOSIS — K56609 Unspecified intestinal obstruction, unspecified as to partial versus complete obstruction: Secondary | ICD-10-CM | POA: Diagnosis not present

## 2022-12-09 DIAGNOSIS — E7800 Pure hypercholesterolemia, unspecified: Secondary | ICD-10-CM

## 2022-12-09 LAB — BASIC METABOLIC PANEL
Anion gap: 8 (ref 5–15)
BUN: 32 mg/dL — ABNORMAL HIGH (ref 8–23)
CO2: 23 mmol/L (ref 22–32)
Calcium: 8.4 mg/dL — ABNORMAL LOW (ref 8.9–10.3)
Chloride: 112 mmol/L — ABNORMAL HIGH (ref 98–111)
Creatinine, Ser: 0.83 mg/dL (ref 0.61–1.24)
GFR, Estimated: >60 mL/min (ref >60)
Glucose, Bld: 85 mg/dL (ref 70–99)
Potassium: 3.3 mmol/L — ABNORMAL LOW (ref 3.5–5.1)
Sodium: 143 mmol/L (ref 135–145)

## 2022-12-09 LAB — CBC
HCT: 36.3 % — ABNORMAL LOW (ref 39.0–52.0)
Hemoglobin: 11.4 g/dL — ABNORMAL LOW (ref 13.0–17.0)
MCH: 30.5 pg (ref 26.0–34.0)
MCHC: 31.4 g/dL (ref 30.0–36.0)
MCV: 97.1 fL (ref 80.0–100.0)
Platelets: 191 10*3/uL (ref 150–400)
RBC: 3.74 MIL/uL — ABNORMAL LOW (ref 4.22–5.81)
RDW: 13.6 % (ref 11.5–15.5)
WBC: 6.9 10*3/uL (ref 4.0–10.5)
nRBC: 0 % (ref 0.0–0.2)

## 2022-12-09 MED ORDER — HALOPERIDOL LACTATE 5 MG/ML IJ SOLN
5.0000 mg | Freq: Four times a day (QID) | INTRAMUSCULAR | Status: DC | PRN
  Administered 2022-12-09 – 2022-12-10 (×3): 5 mg via INTRAVENOUS
  Filled 2022-12-09 (×3): qty 1

## 2022-12-09 MED ORDER — METOPROLOL TARTRATE 12.5 MG HALF TABLET
12.5000 mg | ORAL_TABLET | Freq: Two times a day (BID) | ORAL | Status: DC
  Administered 2022-12-09 – 2022-12-13 (×6): 12.5 mg via ORAL
  Filled 2022-12-09 (×9): qty 1

## 2022-12-09 MED ORDER — KCL IN DEXTROSE-NACL 20-5-0.45 MEQ/L-%-% IV SOLN
INTRAVENOUS | Status: DC
  Filled 2022-12-09 (×2): qty 1000

## 2022-12-09 MED ORDER — HALOPERIDOL LACTATE 5 MG/ML IJ SOLN
2.0000 mg | Freq: Once | INTRAMUSCULAR | Status: AC
  Administered 2022-12-09: 2 mg via INTRAVENOUS
  Filled 2022-12-09: qty 1

## 2022-12-09 NOTE — Progress Notes (Signed)
[facility] PROGRESS NOTE  GROVE DEFINA (DOB: Oct 07, 1940) LKG:[id number] PCP: Daisy Floro, MD  Brief Narrative: Carlos [name] is an 82 y.o. male with a history of dementia, CAD, HLD, GERD who presented to the ED on 12/08/2022 with vomiting at memory care, found to have an incarcerated left inguinal hernia causing SBO. NG tube was placed and the hernia was able to be reduced by surgery on 10/16. While not emergently requiring surgery, due to possibility of recurrence surgery is following.   Subjective: Pt confused, no overnight events.   Objective: BP 130/61 (BP Location: Right Arm)   Pulse 60   Temp 98 F (36.7 C) (Oral)   Resp 18   Ht 5\' 11"  (1.803 m)   Wt 65 kg   SpO2 100%   BMI 19.99 kg/m   Gen: Elderly frail male in no distress Pulm: Clear, nonlabored  CV: RRR, no MRG GI: Soft, NT, ND, +BS. Tenderness in LLQ no palpable defect. Lower abdominal surgical scar stable. Neuro: Drowsy, not oriented. Ext: Warm, no deformities. Skin: Pale without rashes, lesions or ulcers on visualized skin   Assessment & Plan: Incarcerated left inguinal hernia:  - Has been reduced, no emergent surgery required, though to prevent recurrence surgery may be indicated.  - I have requested cardiology risk assessment per surgery request.   SBO: Currently having bowel function, though significant NG output.  - Clamping trial today.  - Diet per surgery, will supplement with IVF if needed.   CAD, HLD: Hx lateral MI with LCx PCI March 2015. - Followed by Dr. Dulce Sellar, last OV Aug 2023. Mentioned "rare angina"  - Holding plavix pending surgical plan. Since NPO, holding statin, and I don't see that he's taking beta blocker.   Dementia:  - Continue seroquel 25mg  qHS if able to tolerate enteral intake.  - prn haldol  DNR: POA  Hypokalemia:  - Supplement in IVF  Tyrone Nine, MD [facility] [email or web address] 12/09/2022, 11:23 AM

## 2022-12-09 NOTE — Progress Notes (Signed)
Patient removed his NG tube MD Casimiro Needle notified.

## 2022-12-09 NOTE — Progress Notes (Signed)
       CROSS COVER NOTE  NAME: MASIYAH ENGEN MRN: [id number] DOB : [birth year]    Date of Service   12/09/2022   HPI/Events of Note   Nurse paged requesting sedating meds because patient was reportedly spitting at staff and attempting to pull out his NG tube. Bedside assessment showed patient with an N95 mask and bilateral mitts.  Patient was responsive to questions and was easily redirectable and also appeared drowsy.  Patient has a Recruitment consultant at bedside and was found laying in bed.  N95 mask was removed from patient's face, patient was repositioned in bed by this NP and covered with a blanket.  Patient was encouraged to try to get some rest.  Interventions   Plan: Continue safety observation.  Assessments did not reveal any need for chemical restraint as patient is in mitts and cannot pull his NG tube and has a Recruitment consultant at bedside.        Chinita Greenland BSN MSNA MSN ACNPC-AG Acute Care Nurse Practitioner [facility]

## 2022-12-09 NOTE — Plan of Care (Signed)

## 2022-12-09 NOTE — TOC Initial Note (Signed)
Transition of Care [facility]) - Initial/Assessment Note   Patient Details  Name: Carlos [name] MRN: [id number] Date of Birth: January 19, 1941  Transition of Care [facility]) CM/SW Contact:    Ewing Schlein, LCSW Phone Number: 12/09/2022, 1:32 PM  Clinical Narrative: Patient is a resident of [facility] memory care unit. CSW spoke with patient's niece/legal guardian/HCPOA, Milen Lengacher. Niece confirmed plan is for patient to return to memory care at discharge when medically ready.  CSW spoke with Marcelino Duster in the memory care unit and confirmed plan for patient to return. Discharge summary and FL2 will need to be faxed to [facility] at [phone] at discharge.  Expected Discharge Plan: Memory Care Barriers to Discharge: Continued Medical Work up  Expected Discharge Plan and Services Living arrangements for the past 2 months: Assisted Living Facility  Prior Living Arrangements/Services Living arrangements for the past 2 months: Assisted Living Facility Lives with:: Facility Resident Patient language and need for interpreter reviewed:: Yes Do you feel safe going back to the place where you live?: Yes      Need for Family Participation in Patient Care: Yes (Comment) (Patient is oriented x2 and resides in memory care.) Care giver support system in place?: Yes (comment) Criminal Activity/Legal Involvement Pertinent to Current Situation/Hospitalization: No - Comment as needed  Emotional Assessment Orientation: : Oriented to Self, Oriented to Place Alcohol / Substance Use: Not Applicable Psych Involvement: No (comment)  Admission diagnosis:  SBO (small bowel obstruction) (HCC) [K56.609] Non-recurrent unilateral inguinal hernia with obstruction without gangrene [K40.30] Patient Active Problem List   Diagnosis Date Noted   SBO (small bowel obstruction) (HCC) 12/08/2022   Incarcerated left inguinal hernia s/p ED bedside reduction 12/08/2022 12/08/2022   Rhabdomyolysis 08/01/2022   Fall  at home, initial encounter 08/01/2022   Dementia with agitation (HCC) 08/01/2022   Amnesia 09/29/2021   Osteoarthritis of knee 09/29/2021   Other long term (current) drug therapy 09/29/2021   Poor balance 09/29/2021   Screening for malignant neoplasm of prostate 09/29/2021   Hypercholesteremia    GERD (gastroesophageal reflux disease)    Angina pectoris (HCC)    Hyperlipidemia 05/14/2013   CAD (coronary artery disease) 05/14/2013   ST elevation myocardial infarction (STEMI) of lateral wall, initial episode of care (HCC) 05/12/2013   PCP:  Daisy Floro, MD Pharmacy:   [facility] ORDER) ELECTRONIC - [place], [place] - [address] [place] [address] Phone: [phone] Fax: [phone]  [facility] [place], [place] - [address] [place] [address] Phone: [phone] Fax: [phone]  Social Determinants of Health (SDOH) Social History: SDOH Screenings   Food Insecurity: No Food Insecurity (08/01/2022)  Housing: Low Risk  (08/01/2022)  Transportation Needs: Patient Unable To Answer (08/01/2022)  Utilities: Not At Risk (08/01/2022)  Tobacco Use: Medium Risk (12/08/2022)   SDOH Interventions:    Readmission Risk Interventions    12/09/2022    1:24 PM  Readmission Risk Prevention Plan  Transportation Screening Complete  HRI or Home Care Consult Complete  Social Work Consult for Recovery Care Planning/Counseling Complete  Palliative Care Screening Not Applicable  Medication Review [employer]) Complete

## 2022-12-09 NOTE — Progress Notes (Addendum)
Subjective: CC: Seen with RN.  Patient is alert to self and place.  Denies abdominal pain or nausea. Scant NGT output in cannister this am - RN reports this was emptied earlier this am with 100cc in cannister. Xray last night with Tip of the enteric tube below the diaphragm in the stomach with the side port beyond the gastroesophageal junction. 3 BM's since admission  Objective: Vital signs in last 24 hours: Temp:  [98.1 F (36.7 C)-98.2 F (36.8 C)] 98.2 F (36.8 C) (10/17 0517) Pulse Rate:  [82-95] 95 (10/17 0517) Resp:  [18] 18 (10/17 0517) BP: (135-145)/(70-85) 145/85 (10/17 0517) SpO2:  [97 %-99 %] 99 % (10/17 0517) Weight:  [65 kg] 65 kg (10/16 1359) Last BM Date : 12/08/22  Intake/Output from previous day: 10/16 0701 - 10/17 0700 In: 1008.2 [I.V.:1008.2] Out: 1050 [Urine:400; Emesis/NG output:650] Intake/Output this shift: No intake/output data recorded.  PE: Gen:  Alert, NAD Abd: Soft, ND, NT, +BS. LIH reduced and NT - no overlying skin changes.   Lab Results:  Recent Labs    12/08/22 1428 12/09/22 0425  WBC 11.3* 6.9  HGB 13.6 11.4*  HCT 42.0 36.3*  PLT 236 191   BMET Recent Labs    12/08/22 1428 12/09/22 0425  NA 144 143  K 4.3 3.3*  CL 108 112*  CO2 23 23  GLUCOSE 111* 85  BUN 40* 32*  CREATININE 1.01 0.83  CALCIUM 9.5 8.4*   PT/INR No results for input(s): "LABPROT", "INR" in the last 72 hours. CMP     Component Value Date/Time   NA 143 12/09/2022 0425   K 3.3 (L) 12/09/2022 0425   CL 112 (H) 12/09/2022 0425   CO2 23 12/09/2022 0425   GLUCOSE 85 12/09/2022 0425   BUN 32 (H) 12/09/2022 0425   CREATININE 0.83 12/09/2022 0425   CALCIUM 8.4 (L) 12/09/2022 0425   PROT 7.3 12/08/2022 1428   ALBUMIN 4.2 12/08/2022 1428   AST 26 12/08/2022 1428   ALT 29 12/08/2022 1428   ALKPHOS 61 12/08/2022 1428   BILITOT 1.6 (H) 12/08/2022 1428   GFRNONAA >60 12/09/2022 0425   GFRAA >90 05/14/2013 0533   Lipase  No results found for:  "LIPASE"  Studies/Results: DG Abdomen 1 View  Result Date: 12/08/2022 CLINICAL DATA:  Nasogastric tube placement EXAM: ABDOMEN - 1 VIEW COMPARISON:  CT abdomen and pelvis 12/08/2022 FINDINGS: Nasogastric tube tip is in the proximal body of the stomach. Residual intravenous contrast identified in the bilateral renal collecting systems. No dilated bowel loops are seen likely secondary to fluid-filled bowel seen on CT. IMPRESSION: Nasogastric tube tip is in the proximal body of the stomach. Electronically Signed   By: Darliss Cheney M.D.   On: 12/08/2022 19:43   DG Abd Portable 1V  Result Date: 12/08/2022 CLINICAL DATA:  Nasogastric tube present. EXAM: PORTABLE ABDOMEN - 1 VIEW COMPARISON:  Radiograph earlier today. FINDINGS: Tip of the enteric tube is below the diaphragm in the stomach, the side port is just beyond the gastroesophageal junction. There is retained contrast in the right renal collecting system. Small bowel dilatation on CT is not demonstrated on the current exam. IMPRESSION: Tip of the enteric tube below the diaphragm in the stomach, the side port is just beyond the gastroesophageal junction. Electronically Signed   By: Narda Rutherford M.D.   On: 12/08/2022 19:42   CT ABDOMEN PELVIS W CONTRAST  Result Date: 12/08/2022 CLINICAL DATA:  Nausea and vomiting. EXAM:  CT ABDOMEN AND PELVIS WITH CONTRAST TECHNIQUE: Multidetector CT imaging of the abdomen and pelvis was performed using the standard protocol following bolus administration of intravenous contrast. RADIATION DOSE REDUCTION: This exam was performed according to the departmental dose-optimization program which includes automated exposure control, adjustment of the mA and/or kV according to patient size and/or use of iterative reconstruction technique. CONTRAST:  OMNIPAQUE IOHEXOL 300 MG/ML  SOLN COMPARISON:  CT chest abdomen and pelvis 08/01/2022 FINDINGS: Lower chest: No acute abnormality. Hepatobiliary: No focal liver  abnormality is seen. No gallstones, gallbladder wall thickening, or biliary dilatation. Pancreas: Diffusely atrophic, unchanged. There is a stable hypodensity in the neck of the pancreas measuring 7 mm. No pancreatic ductal dilatation or surrounding inflammation. Spleen: Normal in size without focal abnormality. Adrenals/Urinary Tract: Left adrenal gland not well seen. Right adrenal gland is within normal limits. Left pelvic kidney is again noted. There is scarring in the left kidney, unchanged. There is no hydronephrosis or perinephric fat stranding. Bilateral peripelvic cysts are present. Bladder is within normal limits. Stomach/Bowel: There is a loop of small bowel which is fluid-filled and mildly dilated in a left inguinal hernia. This is transition point for high-grade small-bowel obstruction. Small-bowel proximal to this is dilated measuring up to 4.8 cm. The stomach is also dilated and fluid-filled. There is no pneumatosis or free air. Distal small bowel and colon are decompressed. The appendix is not seen. Vascular/Lymphatic: Aortic atherosclerosis. No enlarged abdominal or pelvic lymph nodes. Reproductive: Prostate calcifications are present. Other: There is a small amount of free fluid within a left inguinal hernia. There is also small amount of free fluid in the right upper quadrant. Musculoskeletal: No acute osseous findings. Degenerative changes affect the spine and hips. IMPRESSION: 1. High-grade small-bowel obstruction with transition point in a left inguinal hernia compatible with incarcerated/strangulated hernia. 2. Small amount of free fluid in the right upper quadrant and within the left inguinal hernia. 3. Stable 7 mm hypodensity in the neck of the this can be followed up with nonemergent MRI. 4. Small peripelvic cysts.  No follow-up imaging recommended. Aortic Atherosclerosis (ICD10-I70.0). Electronically Signed   By: Darliss Cheney M.D.   On: 12/08/2022 19:15   CT Head Wo Contrast  Result  Date: 12/08/2022 CLINICAL DATA:  Weakness, ataxia, frequent falls EXAM: CT HEAD WITHOUT CONTRAST TECHNIQUE: Contiguous axial images were obtained from the base of the skull through the vertex without intravenous contrast. RADIATION DOSE REDUCTION: This exam was performed according to the departmental dose-optimization program which includes automated exposure control, adjustment of the mA and/or kV according to patient size and/or use of iterative reconstruction technique. COMPARISON:  12/04/2022 FINDINGS: Brain: No evidence of acute infarction, hemorrhage, mass, mass effect, or midline shift. No hydrocephalus. Redemonstrated prominent extra-axial CSF spaces. No new extra-axial collection. Vascular: No hyperdense vessel. Atherosclerotic calcifications in the intracranial carotid and vertebral arteries. Skull: Negative for fracture or focal lesion. Sinuses/Orbits: No acute finding. Other: The mastoid air cells are well aerated. IMPRESSION: No acute intracranial process. Electronically Signed   By: Wiliam Ke M.D.   On: 12/08/2022 19:04   DG Chest Port 1 View  Result Date: 12/08/2022 CLINICAL DATA:  Altered mental status. EXAM: PORTABLE CHEST 1 VIEW COMPARISON:  Chest radiographs 12/04/2022 and 11/02/2022 FINDINGS: Cardiac silhouette and mediastinal contours are within normal limits. Mild-to-moderate atherosclerotic calcifications within the aortic arch. Mild elevation of left hemidiaphragm, similar to prior. The lungs are clear. No pleural effusion or pneumothorax. Mild dextrocurvature of the midthoracic spine, similar to  prior. IMPRESSION: No acute cardiopulmonary disease process. Electronically Signed   By: Neita Garnet M.D.   On: 12/08/2022 17:19    Anti-infectives: Anti-infectives (From admission, onward)    None        Assessment/Plan SBO 2/2 incarcerated LIH - now reduced - LIH is reduced and patient with return of bowel function. Discussed with attending - plan NGT clamping trial + CLD  and check residuals in 6 hours.  - No family at bedside. Checking with primary if patient is still on plavix at baseline. If he is, please hold this if able during admission.   - No indication for emergency surgery. Await medical and cardiac clearance incase patient does require surgery during this admission. We will follow with you.   FEN - NGT clamped. Clears from the floor. IVF per primary VTE - SCDs, lovenox ID - None  I reviewed nursing notes, last 24 h vitals and pain scores, last 48 h intake and output, last 24 h labs and trends, and last 24 h imaging results.   LOS: 1 day    Jacinto Halim , [facility] 12/09/2022, 9:52 AM Please see Amion for pager number during day hours 7:00am-4:30pm

## 2022-12-09 NOTE — Consult Note (Signed)
Cardiology Consultation   Patient ID: Carlos [name] MRN: [id number]; DOB: October 01, 1940  Admit date: 12/08/2022 Date of Consult: 12/09/2022  PCP:  Daisy Floro, MD   [facility] Providers Cardiologist:  Dr Dulce Sellar  Patient Profile:   Carlos [name] is a 82 y.o. male with a hx of CAD status post DES to left circumflex 2015, dementia, hyperlipidemia, osteoarthritis, who is being seen 12/09/2022 for the evaluation of preop evaluation at the request of Leary Roca PA.    History of Present Illness:   Carlos [name] with above past medical history presented to the ER for worsening agitation and confusion, nausea with vomiting, feculent emesis, abdominal pain.  CTAP from 12/08/2022 revealed high-grade small bowel obstruction with transition point and a left inguinal hernia compatible with incarcerated hernia.  Small amount of free fluid in the right upper quadrant and within the left inguinal hernia.  He was treated conservatively with NG decompression.  Surgery was consulted, did not feel emergent surgery is required, and requested cardiology consult for preop evaluation.   Patient has advanced dementia with behavior disturbance.  He is confused, unable to provide history, making inappropriate comments continuously in the room.  He resides at a memory care unit.  His legal guardian is his niece Carlos [name], who is at bedside to assist history telling.  She took over his care since June this year, prior to that, patient was living with his girlfriend.  She reports he had frequent falls since June this year.  She states patient walks independently a lot at memory care unit on level ground.  She does not believe patient is able to climb up stairs.  He is able to feed himself, requires daily assistance for other ADLs such as dressing himself, shower, etc. she had expressed that she would wish to avoid surgery if able.  She states patient did not have any significant complaints of chest pain  recently.   Per records, He suffered lateral STEMI in 05/12/2013 secondary to occlusion of a large-distribution left circumflex, this was treated successfully with PCI using a DES. Cath showed multivessel CAD with moderately severe diffuse mid-LAD stenosis and chronic total occlusion of the RCA. LVEF was preserved 55% by LV gram.  No echocardiogram found.  He was last seen by Dr. Dulce Sellar 09/30/2021, stable from CAD standpoint, continued Plavix, beta-blocker, statin for medical therapy.  Admission diagnostic revealed elevated total bilirubin 1.6, BUN 40, creatinine 1.01, and GFR >60.  CBC differential with leukocytosis 11 300.  Urinalysis with hemoglobin and ketones.  CT brain revealed no acute finding.EKG revealed sinus rhythm, inferolateral nonspecific ST-T abnormality, artifact somewhat limiting review.       Past Medical History:  Diagnosis Date   Amnesia 09/29/2021   Angina pectoris (HCC)    CAD (coronary artery disease) 05/14/2013   GERD (gastroesophageal reflux disease)    Hypercholesteremia    Hyperlipidemia 05/14/2013   NSTEMI (non-ST elevated myocardial infarction) (HCC) 08/01/2022   Osteoarthritis of knee 09/29/2021   Other long term (current) drug therapy 09/29/2021   Poor balance 09/29/2021   Screening for malignant neoplasm of prostate 09/29/2021   ST elevation myocardial infarction (STEMI) of lateral wall, initial episode of care (HCC) 05/12/2013    Past Surgical History:  Procedure Laterality Date   ABDOMINAL SURGERY     CARDIAC CATHETERIZATION     CORONARY ANGIOPLASTY     LEFT HEART CATHETERIZATION WITH CORONARY ANGIOGRAM N/A 05/12/2013   Procedure: LEFT HEART CATHETERIZATION WITH CORONARY ANGIOGRAM;  Surgeon:  Micheline Chapman, MD;  Location: [facility] CATH LAB;  Service: Cardiovascular;  Laterality: N/A;   PERCUTANEOUS STENT INTERVENTION N/A 05/12/2013   Procedure: PERCUTANEOUS STENT INTERVENTION;  Surgeon: Micheline Chapman, MD;  Location: [facility] CATH LAB;  Service: Cardiovascular;   Laterality: N/A;     Home Medications:  Prior to Admission medications   Medication Sig Start Date End Date Taking? Authorizing Provider  b complex vitamins tablet Take 1 tablet by mouth every Monday.   Yes [provider]  Calcium Carbonate (CALCARB 600 PO) Take 1,200 mg by mouth every evening. 10/06/22 01/04/23 Yes [provider]  calcium carbonate (TUMS EX) 750 MG chewable tablet Chew 1 tablet by mouth every 8 (eight) hours as needed for heartburn.   Yes [provider]  clopidogrel (PLAVIX) 75 MG tablet Take 1 tablet (75 mg total) by mouth daily. 09/30/21  Yes Baldo Daub, MD  Glucosamine HCl (GLUCOSAMINE PO) Take 2 tablets by mouth 2 (two) times daily.   Yes [provider]  hydrOXYzine (VISTARIL) 25 MG capsule Take 25 mg by mouth every 12 (twelve) hours as needed (for agitation). 12/08/22 01/07/23 Yes [provider]  loperamide (IMODIUM A-D) 2 MG tablet Take 2 mg by mouth every 6 (six) hours as needed for diarrhea or loose stools.   Yes [provider]  Multiple Vitamin (MULTIVITAMIN) tablet Take 1 tablet by mouth daily with breakfast.   Yes [provider]  nitroGLYCERIN (NITROSTAT) 0.4 MG SL tablet Place 1 tablet (0.4 mg total) under the tongue every 5 (five) minutes x 3 doses as needed for chest pain. Patient taking differently: Place 0.4 mg under the tongue every 5 (five) minutes x 3 doses as needed for chest pain (CALL MD, IF NO RELIEF). 09/30/21  Yes Baldo Daub, MD  Omega-3 Fatty Acids (FISH OIL OMEGA-3) 1000 MG CAPS Take 1,000 mg by mouth daily.   Yes [provider]  ondansetron (ZOFRAN) 4 MG tablet Take 4 mg by mouth every 12 (twelve) hours as needed for nausea. 12/07/22 01/06/23 Yes [provider]  polyethylene glycol (MIRALAX / GLYCOLAX) 17 g packet Take 17 g by mouth daily as needed for mild constipation. Patient taking differently: Take 17 g by mouth daily as needed for mild constipation (to  be mixed into 4 ounces of water). 08/06/22  Yes Dahal, Melina Schools, MD  QUEtiapine (SEROQUEL) 25 MG tablet Take 1 tablet (25 mg total) by mouth 2 (two) times daily. Patient taking differently: Take 25 mg by mouth at bedtime. 08/06/22  Yes Dahal, Melina Schools, MD  simvastatin (ZOCOR) 40 MG tablet Take 40 mg by mouth every evening.   Yes [provider]  vitamin E 180 MG (400 UNITS) capsule Take 400 Units by mouth daily.   Yes [provider]  docusate sodium (COLACE) 100 MG capsule Take 1 capsule (100 mg total) by mouth 2 (two) times daily. Patient not taking: Reported on 12/08/2022 08/06/22   Lorin Glass, MD    Inpatient Medications: Scheduled Meds:  enoxaparin (LOVENOX) injection  40 mg Subcutaneous QHS   QUEtiapine  25 mg Oral QHS   Continuous Infusions:  dextrose 5 % and 0.45 % NaCl with KCl 20 mEq/L 75 mL/hr at 12/09/22 1224   PRN Meds: acetaminophen **OR** acetaminophen, albuterol, fentaNYL (SUBLIMAZE) injection, ondansetron **OR** ondansetron (ZOFRAN) IV, oxyCODONE, traZODone  Allergies:   No Known Allergies  Social History:   Social History   Socioeconomic History   Marital status: Single    Spouse name:  Not on file   Number of children: Not on file   Years of education: Not on file   Highest education level: Not on file  Occupational History   Not on file  Tobacco Use   Smoking status: Former   Smokeless tobacco: Former  Advertising account planner   Vaping status: Never Used  Substance and Sexual Activity   Alcohol use: No   Drug use: No   Sexual activity: Not on file  Other Topics Concern   Not on file  Social History Narrative   Not on file   Social Determinants of Health   Financial Resource Strain: Not on file  Food Insecurity: No Food Insecurity (08/01/2022)   Hunger Vital Sign    Worried About Running Out of Food in the Last Year: Never true    Ran Out of Food in the Last Year: Never true  Transportation Needs: Patient Unable To Answer (08/01/2022)   PRAPARE -  Transportation    Lack of Transportation (Medical): Patient unable to answer    Lack of Transportation (Non-Medical): Patient unable to answer  Physical Activity: Not on file  Stress: Not on file  Social Connections: Not on file  Intimate Partner Violence: Patient Unable To Answer (08/01/2022)   Humiliation, Afraid, Rape, and Kick questionnaire    Fear of Current or Ex-Partner: Patient unable to answer    Emotionally Abused: Patient unable to answer    Physically Abused: Patient unable to answer    Sexually Abused: Patient unable to answer    Family History:    Family History  Problem Relation Age of Onset   Alzheimer's disease Mother    CAD Brother    CAD Brother      ROS:  Unable to obtain as patient is confused      Physical Exam/Data:   Vitals:   12/08/22 1400 12/08/22 2040 12/09/22 0517 12/09/22 1055  BP: 135/70 (!) 145/83 (!) 145/85 130/61  Pulse: 85 82 95 60  Resp: 18 18 18 18   Temp: 98.2 F (36.8 C) 98.1 F (36.7 C) 98.2 F (36.8 C) 98 F (36.7 C)  TempSrc: Oral Oral Oral Oral  SpO2: 97% 98% 99% 100%  Weight:      Height:        Intake/Output Summary (Last 24 hours) at 12/09/2022 1248 Last data filed at 12/09/2022 1000 Gross per 24 hour  Intake 1008.23 ml  Output 1600 ml  Net -591.77 ml      12/08/2022    1:59 PM 12/04/2022    8:02 AM 11/02/2022    2:41 AM  Last 3 Weights  Weight (lbs) 143 lb 4.8 oz 143 lb 4.8 oz 141 lb 1.5 oz  Weight (kg) 65 kg 65 kg 64 kg     Body mass index is 19.99 kg/m.  Vitals:  Vitals:   12/09/22 0517 12/09/22 1055  BP: (!) 145/85 130/61  Pulse: 95 60  Resp: 18 18  Temp: 98.2 F (36.8 C) 98 F (36.7 C)  SpO2: 99% 100%   General Appearance: Ill-appearing, agitated in bed, continuously making inappropriate comments HEENT: Normocephalic, atraumatic.  Eyes remains closed  neck: Supple, trachea midline, no JVDs Cardiovascular: Regular rate and rhythm, normal S1-S2, no murmur Respiratory: Resting breathing unlabored,  lungs sounds clear to auscultation bilaterally, no use of accessory muscles. On room air.  No wheezes, rales or rhonchi.   Gastrointestinal: NG in place with feculent drainage Extremities: Able to move all extremities in bed without difficulty, no edema noted  of bilateral lower extremity Musculoskeletal: Normal muscle bulk and tone Skin: Intact, warm, dry. No rashes Neurologic: Confused, agitated, does not answer question appropriately, does not follow commands Psychiatric: Normal affect. Mood is appropriate.    EKG:  The EKG was personally reviewed and demonstrates:    EKG from 12/04/22 showed sinus rhythm, T wave flattening of inferolateral leads   EKG from 12/08/22 showed sinus rhythm, PACs, new ST elevation of inferolateral leads, will review with Dr Mayford Knife      Relevant CV Studies:   No previous workup   Laboratory Data:  High Sensitivity Troponin:  No results for input(s): "TROPONINIHS" in the last 720 hours.   Chemistry Recent Labs  Lab 12/08/22 1428 12/09/22 0425  NA 144 143  K 4.3 3.3*  CL 108 112*  CO2 23 23  GLUCOSE 111* 85  BUN 40* 32*  CREATININE 1.01 0.83  CALCIUM 9.5 8.4*  GFRNONAA >60 >60  ANIONGAP 13 8    Recent Labs  Lab 12/08/22 1428  PROT 7.3  ALBUMIN 4.2  AST 26  ALT 29  ALKPHOS 61  BILITOT 1.6*   Lipids No results for input(s): "CHOL", "TRIG", "HDL", "LABVLDL", "LDLCALC", "CHOLHDL" in the last 168 hours.  Hematology Recent Labs  Lab 12/08/22 1428 12/09/22 0425  WBC 11.3* 6.9  RBC 4.45 3.74*  HGB 13.6 11.4*  HCT 42.0 36.3*  MCV 94.4 97.1  MCH 30.6 30.5  MCHC 32.4 31.4  RDW 13.8 13.6  PLT 236 191   Thyroid No results for input(s): "TSH", "FREET4" in the last 168 hours.  BNPNo results for input(s): "BNP", "PROBNP" in the last 168 hours.  DDimer No results for input(s): "DDIMER" in the last 168 hours.   Radiology/Studies:  DG Abdomen 1 View  Result Date: 12/08/2022 CLINICAL DATA:  Nasogastric tube placement EXAM: ABDOMEN -  1 VIEW COMPARISON:  CT abdomen and pelvis 12/08/2022 FINDINGS: Nasogastric tube tip is in the proximal body of the stomach. Residual intravenous contrast identified in the bilateral renal collecting systems. No dilated bowel loops are seen likely secondary to fluid-filled bowel seen on CT. IMPRESSION: Nasogastric tube tip is in the proximal body of the stomach. Electronically Signed   By: Darliss Cheney M.D.   On: 12/08/2022 19:43   DG Abd Portable 1V  Result Date: 12/08/2022 CLINICAL DATA:  Nasogastric tube present. EXAM: PORTABLE ABDOMEN - 1 VIEW COMPARISON:  Radiograph earlier today. FINDINGS: Tip of the enteric tube is below the diaphragm in the stomach, the side port is just beyond the gastroesophageal junction. There is retained contrast in the right renal collecting system. Small bowel dilatation on CT is not demonstrated on the current exam. IMPRESSION: Tip of the enteric tube below the diaphragm in the stomach, the side port is just beyond the gastroesophageal junction. Electronically Signed   By: Narda Rutherford M.D.   On: 12/08/2022 19:42   CT ABDOMEN PELVIS W CONTRAST  Result Date: 12/08/2022 CLINICAL DATA:  Nausea and vomiting. EXAM: CT ABDOMEN AND PELVIS WITH CONTRAST TECHNIQUE: Multidetector CT imaging of the abdomen and pelvis was performed using the standard protocol following bolus administration of intravenous contrast. RADIATION DOSE REDUCTION: This exam was performed according to the departmental dose-optimization program which includes automated exposure control, adjustment of the mA and/or kV according to patient size and/or use of iterative reconstruction technique. CONTRAST:  OMNIPAQUE IOHEXOL 300 MG/ML  SOLN COMPARISON:  CT chest abdomen and pelvis 08/01/2022 FINDINGS: Lower chest: No acute abnormality. Hepatobiliary: No focal liver abnormality is  seen. No gallstones, gallbladder wall thickening, or biliary dilatation. Pancreas: Diffusely atrophic, unchanged. There is a  stable hypodensity in the neck of the pancreas measuring 7 mm. No pancreatic ductal dilatation or surrounding inflammation. Spleen: Normal in size without focal abnormality. Adrenals/Urinary Tract: Left adrenal gland not well seen. Right adrenal gland is within normal limits. Left pelvic kidney is again noted. There is scarring in the left kidney, unchanged. There is no hydronephrosis or perinephric fat stranding. Bilateral peripelvic cysts are present. Bladder is within normal limits. Stomach/Bowel: There is a loop of small bowel which is fluid-filled and mildly dilated in a left inguinal hernia. This is transition point for high-grade small-bowel obstruction. Small-bowel proximal to this is dilated measuring up to 4.8 cm. The stomach is also dilated and fluid-filled. There is no pneumatosis or free air. Distal small bowel and colon are decompressed. The appendix is not seen. Vascular/Lymphatic: Aortic atherosclerosis. No enlarged abdominal or pelvic lymph nodes. Reproductive: Prostate calcifications are present. Other: There is a small amount of free fluid within a left inguinal hernia. There is also small amount of free fluid in the right upper quadrant. Musculoskeletal: No acute osseous findings. Degenerative changes affect the spine and hips. IMPRESSION: 1. High-grade small-bowel obstruction with transition point in a left inguinal hernia compatible with incarcerated/strangulated hernia. 2. Small amount of free fluid in the right upper quadrant and within the left inguinal hernia. 3. Stable 7 mm hypodensity in the neck of the this can be followed up with nonemergent MRI. 4. Small peripelvic cysts.  No follow-up imaging recommended. Aortic Atherosclerosis (ICD10-I70.0). Electronically Signed   By: Darliss Cheney M.D.   On: 12/08/2022 19:15   CT Head Wo Contrast  Result Date: 12/08/2022 CLINICAL DATA:  Weakness, ataxia, frequent falls EXAM: CT HEAD WITHOUT CONTRAST TECHNIQUE: Contiguous axial images were  obtained from the base of the skull through the vertex without intravenous contrast. RADIATION DOSE REDUCTION: This exam was performed according to the departmental dose-optimization program which includes automated exposure control, adjustment of the mA and/or kV according to patient size and/or use of iterative reconstruction technique. COMPARISON:  12/04/2022 FINDINGS: Brain: No evidence of acute infarction, hemorrhage, mass, mass effect, or midline shift. No hydrocephalus. Redemonstrated prominent extra-axial CSF spaces. No new extra-axial collection. Vascular: No hyperdense vessel. Atherosclerotic calcifications in the intracranial carotid and vertebral arteries. Skull: Negative for fracture or focal lesion. Sinuses/Orbits: No acute finding. Other: The mastoid air cells are well aerated. IMPRESSION: No acute intracranial process. Electronically Signed   By: Wiliam Ke M.D.   On: 12/08/2022 19:04   DG Chest Port 1 View  Result Date: 12/08/2022 CLINICAL DATA:  Altered mental status. EXAM: PORTABLE CHEST 1 VIEW COMPARISON:  Chest radiographs 12/04/2022 and 11/02/2022 FINDINGS: Cardiac silhouette and mediastinal contours are within normal limits. Mild-to-moderate atherosclerotic calcifications within the aortic arch. Mild elevation of left hemidiaphragm, similar to prior. The lungs are clear. No pleural effusion or pneumothorax. Mild dextrocurvature of the midthoracic spine, similar to prior. IMPRESSION: No acute cardiopulmonary disease process. Electronically Signed   By: Neita Garnet M.D.   On: 12/08/2022 17:19     Assessment and Plan:   Pre-op risk evaluation  - RCRI 2, 6.6% perioperative risk of major cardiac events -DASI 4.5, 3.3 METS -Given his advanced dementia, advanced age, coronary artery disease, he is at high risk for planned surgery for SBO/incarcerated hernia, from history supplemented by family, he is overall stable from CAD standpoint, would not pursue additional cardiac workup  before surgery if  it is indicated, cardiology follow postoperatively to assist management, see attending addendum for additional recommendation   CAD s/p circumflex stent 2015 -Has been stable from cardiac standpoint since PCI - EKG from 12/08/22 appears concerning for STE but with artifacts, changed comparing to EKG on 12/04/22, will review with Dr Mayford Knife and repeat EKG now  - OK to hold Plavix, satin given NPO status for SBO    Risk Assessment/Risk Scores:       For questions or updates, please contact [facility] Please consult [email or web address] for contact info under    Signed, Cyndi Bender, NP  12/09/2022 12:48 PM

## 2022-12-09 NOTE — Progress Notes (Signed)
Sent message to Anthoney Harada, NP of pt's behaviors (projectile spitting at staff, in room in general) and stating that he was aware of what he was doing . He has made unsuccessful attempts at pulling NG tube out and trying to get out of bed. Sitter at bedside. No response to message. However, Anthoney Harada, NP along with another [facility] provider came up to bedside to see the pt. No new orders currently.

## 2022-12-10 ENCOUNTER — Inpatient Hospital Stay (HOSPITAL_COMMUNITY): Payer: Medicare Other

## 2022-12-10 DIAGNOSIS — Z01818 Encounter for other preprocedural examination: Secondary | ICD-10-CM

## 2022-12-10 DIAGNOSIS — I2510 Atherosclerotic heart disease of native coronary artery without angina pectoris: Secondary | ICD-10-CM | POA: Diagnosis not present

## 2022-12-10 DIAGNOSIS — K56609 Unspecified intestinal obstruction, unspecified as to partial versus complete obstruction: Secondary | ICD-10-CM | POA: Diagnosis not present

## 2022-12-10 LAB — ECHOCARDIOGRAM COMPLETE
Height: 71 in
S' Lateral: 3.3 cm
Weight: 2292.78 [oz_av]

## 2022-12-10 LAB — BASIC METABOLIC PANEL
Anion gap: 7 (ref 5–15)
BUN: 16 mg/dL (ref 8–23)
CO2: 26 mmol/L (ref 22–32)
Calcium: 8.6 mg/dL — ABNORMAL LOW (ref 8.9–10.3)
Chloride: 106 mmol/L (ref 98–111)
Creatinine, Ser: 0.67 mg/dL (ref 0.61–1.24)
GFR, Estimated: >60 mL/min (ref >60)
Glucose, Bld: 92 mg/dL (ref 70–99)
Potassium: 3.3 mmol/L — ABNORMAL LOW (ref 3.5–5.1)
Sodium: 139 mmol/L (ref 135–145)

## 2022-12-10 MED ORDER — KCL IN DEXTROSE-NACL 20-5-0.45 MEQ/L-%-% IV SOLN
INTRAVENOUS | Status: DC
  Filled 2022-12-10 (×2): qty 1000

## 2022-12-10 MED ORDER — POTASSIUM CHLORIDE 2 MEQ/ML IV SOLN
INTRAVENOUS | Status: DC
Start: 2022-12-10 — End: 2022-12-10

## 2022-12-10 NOTE — Progress Notes (Signed)
[facility] PROGRESS NOTE  Carlos [name] (DOB: 06-27-1940) WGN:[id number] PCP: Daisy Floro, MD  Brief Narrative: Carlos [name] is an 82 y.o. male with a history of dementia, CAD, HLD, GERD who presented to the ED on 12/08/2022 with vomiting at memory care, found to have an incarcerated left inguinal hernia causing SBO. NG tube was placed and the hernia was able to be reduced by surgery on 10/16. While not emergently requiring surgery, due to possibility of recurrence surgery is following.   12/10/2022: Patient seen.  No significant history from patient.  Potassium of 3.3 noted.  Labs reviewed today: Sodium of 139, potassium of 3.3, BUN of 16, creatinine of 0.67, blood sugar of 106 and EGFR of greater than 60.  Subjective: Patient could not or will not give any significant history.  Objective: Gen: Mildly lethargic.  Not in any distress. Lungs: Clear to auscultation. CVS: S1-S2. Abdomen: Soft and nontender. Neuro: Awake and lethargic. Extremities: No leg edema.    Assessment & Plan: Incarcerated left inguinal hernia:  -Surgical team has reduced hernia. -Surgery team is directing care. -Further care as per surgical team.    SBO:  -Currently having bowel function -Surgery is directing care.    CAD, HLD: Hx lateral MI with LCx PCI March 2015. - Followed by Dr. Dulce Sellar, last OV Aug 2023. Mentioned "rare angina"  - Holding plavix pending surgical plan. Since NPO, holding statin, and I don't see that he's taking beta blocker.   Dementia:  - Continue seroquel 25mg  qHS if able to tolerate enteral intake.  - prn haldol  Hypokalemia: -Continue D5 quarter with potassium. -Check potassium and magnesium level in the morning.  DNR: POA   Barnetta Chapel, MD [facility] [email or web address] 12/10/2022, 5:34 PM

## 2022-12-10 NOTE — Progress Notes (Signed)
Subjective: CC: Received haldol this am. Denies abdominal pain. NGT out. No vomiting overnight. He cannot tell me if he has tried cld. Last bm 10/16.   Objective: Vital signs in last 24 hours: Temp:  [98 F (36.7 C)-98.2 F (36.8 C)] 98.1 F (36.7 C) (10/18 0556) Pulse Rate:  [59-74] 69 (10/18 0556) Resp:  [16-18] 18 (10/18 0556) BP: (126-145)/(70-104) 126/104 (10/18 0556) SpO2:  [99 %-100 %] 100 % (10/18 0556) Last BM Date : 12/08/22  Intake/Output from previous day: 10/17 0701 - 10/18 0700 In: 1016.6 [P.O.:180; I.V.:836.6] Out: 1325 [Urine:1250; Emesis/NG output:75] Intake/Output this shift: No intake/output data recorded.  PE: Gen:  Alert, NAD Abd: Soft, ND, NT, +BS. LIH reduced and NT - no overlying skin changes.   Lab Results:  Recent Labs    12/08/22 1428 12/09/22 0425  WBC 11.3* 6.9  HGB 13.6 11.4*  HCT 42.0 36.3*  PLT 236 191   BMET Recent Labs    12/09/22 0425 12/10/22 0429  NA 143 139  K 3.3* 3.3*  CL 112* 106  CO2 23 26  GLUCOSE 85 92  BUN 32* 16  CREATININE 0.83 0.67  CALCIUM 8.4* 8.6*   PT/INR No results for input(s): "LABPROT", "INR" in the last 72 hours. CMP     Component Value Date/Time   NA 139 12/10/2022 0429   K 3.3 (L) 12/10/2022 0429   CL 106 12/10/2022 0429   CO2 26 12/10/2022 0429   GLUCOSE 92 12/10/2022 0429   BUN 16 12/10/2022 0429   CREATININE 0.67 12/10/2022 0429   CALCIUM 8.6 (L) 12/10/2022 0429   PROT 7.3 12/08/2022 1428   ALBUMIN 4.2 12/08/2022 1428   AST 26 12/08/2022 1428   ALT 29 12/08/2022 1428   ALKPHOS 61 12/08/2022 1428   BILITOT 1.6 (H) 12/08/2022 1428   GFRNONAA >60 12/10/2022 0429   GFRAA >90 05/14/2013 0533   Lipase  No results found for: "LIPASE"  Studies/Results: ECHOCARDIOGRAM COMPLETE  Result Date: 12/10/2022    ECHOCARDIOGRAM REPORT   Patient Name:   GRANTLAND MUNNS Date of Exam: 12/10/2022 Medical Rec #:  [id number]        Height:       71.0 in Accession #:    [id number]        Weight:       143.3 lb Date of Birth:  07-23-1940         BSA:          1.830 m Patient Age:    82 years         BP:           126/104 mmHg Patient Gender: M                HR:           82 bpm. Exam Location:  Inpatient Procedure: 2D Echo and Cardiac Doppler STAT ECHO Indications:    Z01.818 Encounter for other preprocedural examination  History:        Patient has no prior history of Echocardiogram examinations. CAD                 and Previous Myocardial Infarction, Signs/Symptoms:Alzheimer's                 and Altered Mental Status; Risk Factors:Dyslipidemia.  Sonographer:    Sheralyn Boatman RDCS Referring Phys: [phone] TRACI R TURNER  Sonographer Comments: Image acquisition challenging due to uncooperative patient and Image acquisition  challenging due to patient behavioral factors. Patient could not lie still for exam. Patient could not stop pushing probe off chest and pushing me away. Exam ended. IMPRESSIONS  1. Very limited exam, only a few parasternal long axis images were obtained and then exam was ended due to patient being uncooperative and pushing probe away. From limited images, appears grossly normal LV systolic function.  2. Aortic valve is clearly stenotic, however no gradients could be obtained.  3. Suggest repeating exam when patient more cooperative FINDINGS  Left Ventricle: Left ventricular ejection fraction, by estimation, is 60 to 65%. The left ventricle has normal function. Left ventricular endocardial border not optimally defined to evaluate regional wall motion. The left ventricular internal cavity size was normal in size. There is mild left ventricular hypertrophy. Left ventricular diastolic function could not be evaluated. Right Ventricle: The right ventricular size is not assessed. Not assessed. Right ventricular systolic function not assessed. Left Atrium: Left atrial size was not assessed. Right Atrium: Right atrial size was not assessed. Pericardium: Trivial pericardial effusion is present.  Mitral Valve: The mitral valve is normal in structure. Trivial mitral valve regurgitation. Tricuspid Valve: The tricuspid valve is not assessed. Tricuspid valve regurgitation not assessed. Aortic Valve: Aortic valve is clearly stenotic, however no gradients could be obtained. The aortic valve was not assessed. Aortic valve regurgitation not assessed. Pulmonic Valve: The pulmonic valve was not assessed. Pulmonic valve regurgitation not assessed. Aorta: Aortic root could not be assessed. IAS/Shunts: The interatrial septum was not assessed.  LEFT VENTRICLE PLAX 2D LVIDd:         4.40 cm LVIDs:         3.30 cm LV PW:         1.10 cm LV IVS:        1.20 cm LVOT diam:     2.50 cm LVOT Area:     4.91 cm   SHUNTS Systemic Diam: 2.50 cm Epifanio Lesches MD Electronically signed by Epifanio Lesches MD Signature Date/Time: 12/10/2022/10:45:54 AM    Final    DG Abdomen 1 View  Result Date: 12/08/2022 CLINICAL DATA:  Nasogastric tube placement EXAM: ABDOMEN - 1 VIEW COMPARISON:  CT abdomen and pelvis 12/08/2022 FINDINGS: Nasogastric tube tip is in the proximal body of the stomach. Residual intravenous contrast identified in the bilateral renal collecting systems. No dilated bowel loops are seen likely secondary to fluid-filled bowel seen on CT. IMPRESSION: Nasogastric tube tip is in the proximal body of the stomach. Electronically Signed   By: Darliss Cheney M.D.   On: 12/08/2022 19:43   DG Abd Portable 1V  Result Date: 12/08/2022 CLINICAL DATA:  Nasogastric tube present. EXAM: PORTABLE ABDOMEN - 1 VIEW COMPARISON:  Radiograph earlier today. FINDINGS: Tip of the enteric tube is below the diaphragm in the stomach, the side port is just beyond the gastroesophageal junction. There is retained contrast in the right renal collecting system. Small bowel dilatation on CT is not demonstrated on the current exam. IMPRESSION: Tip of the enteric tube below the diaphragm in the stomach, the side port is just beyond the  gastroesophageal junction. Electronically Signed   By: Narda Rutherford M.D.   On: 12/08/2022 19:42   CT ABDOMEN PELVIS W CONTRAST  Result Date: 12/08/2022 CLINICAL DATA:  Nausea and vomiting. EXAM: CT ABDOMEN AND PELVIS WITH CONTRAST TECHNIQUE: Multidetector CT imaging of the abdomen and pelvis was performed using the standard protocol following bolus administration of intravenous contrast. RADIATION DOSE REDUCTION: This exam was performed  according to the departmental dose-optimization program which includes automated exposure control, adjustment of the mA and/or kV according to patient size and/or use of iterative reconstruction technique. CONTRAST:  OMNIPAQUE IOHEXOL 300 MG/ML  SOLN COMPARISON:  CT chest abdomen and pelvis 08/01/2022 FINDINGS: Lower chest: No acute abnormality. Hepatobiliary: No focal liver abnormality is seen. No gallstones, gallbladder wall thickening, or biliary dilatation. Pancreas: Diffusely atrophic, unchanged. There is a stable hypodensity in the neck of the pancreas measuring 7 mm. No pancreatic ductal dilatation or surrounding inflammation. Spleen: Normal in size without focal abnormality. Adrenals/Urinary Tract: Left adrenal gland not well seen. Right adrenal gland is within normal limits. Left pelvic kidney is again noted. There is scarring in the left kidney, unchanged. There is no hydronephrosis or perinephric fat stranding. Bilateral peripelvic cysts are present. Bladder is within normal limits. Stomach/Bowel: There is a loop of small bowel which is fluid-filled and mildly dilated in a left inguinal hernia. This is transition point for high-grade small-bowel obstruction. Small-bowel proximal to this is dilated measuring up to 4.8 cm. The stomach is also dilated and fluid-filled. There is no pneumatosis or free air. Distal small bowel and colon are decompressed. The appendix is not seen. Vascular/Lymphatic: Aortic atherosclerosis. No enlarged abdominal or pelvic lymph  nodes. Reproductive: Prostate calcifications are present. Other: There is a small amount of free fluid within a left inguinal hernia. There is also small amount of free fluid in the right upper quadrant. Musculoskeletal: No acute osseous findings. Degenerative changes affect the spine and hips. IMPRESSION: 1. High-grade small-bowel obstruction with transition point in a left inguinal hernia compatible with incarcerated/strangulated hernia. 2. Small amount of free fluid in the right upper quadrant and within the left inguinal hernia. 3. Stable 7 mm hypodensity in the neck of the this can be followed up with nonemergent MRI. 4. Small peripelvic cysts.  No follow-up imaging recommended. Aortic Atherosclerosis (ICD10-I70.0). Electronically Signed   By: Darliss Cheney M.D.   On: 12/08/2022 19:15   CT Head Wo Contrast  Result Date: 12/08/2022 CLINICAL DATA:  Weakness, ataxia, frequent falls EXAM: CT HEAD WITHOUT CONTRAST TECHNIQUE: Contiguous axial images were obtained from the base of the skull through the vertex without intravenous contrast. RADIATION DOSE REDUCTION: This exam was performed according to the departmental dose-optimization program which includes automated exposure control, adjustment of the mA and/or kV according to patient size and/or use of iterative reconstruction technique. COMPARISON:  12/04/2022 FINDINGS: Brain: No evidence of acute infarction, hemorrhage, mass, mass effect, or midline shift. No hydrocephalus. Redemonstrated prominent extra-axial CSF spaces. No new extra-axial collection. Vascular: No hyperdense vessel. Atherosclerotic calcifications in the intracranial carotid and vertebral arteries. Skull: Negative for fracture or focal lesion. Sinuses/Orbits: No acute finding. Other: The mastoid air cells are well aerated. IMPRESSION: No acute intracranial process. Electronically Signed   By: Wiliam Ke M.D.   On: 12/08/2022 19:04   DG Chest Port 1 View  Result Date:  12/08/2022 CLINICAL DATA:  Altered mental status. EXAM: PORTABLE CHEST 1 VIEW COMPARISON:  Chest radiographs 12/04/2022 and 11/02/2022 FINDINGS: Cardiac silhouette and mediastinal contours are within normal limits. Mild-to-moderate atherosclerotic calcifications within the aortic arch. Mild elevation of left hemidiaphragm, similar to prior. The lungs are clear. No pleural effusion or pneumothorax. Mild dextrocurvature of the midthoracic spine, similar to prior. IMPRESSION: No acute cardiopulmonary disease process. Electronically Signed   By: Neita Garnet M.D.   On: 12/08/2022 17:19    Anti-infectives: Anti-infectives (From admission, onward)    None  Assessment/Plan SBO 2/2 incarcerated LIH - now reduced - LIH is reduced and patient with return of bowel function. NGT out. No vomiting overnight. Allow CLD. ADAT.  - No indication for emergency surgery. Cardiology following - high risk per cards. They are getting an echo. Appreciate their recs. Given he is high risk, hopefully can get him over this admission without surgery. He could follow up with [place] electively if he/family would like to discuss surgery, understanding the risk. Recommend truss belt.   FEN - CLD, IVF per primary VTE - SCDs, lovenox. Hold plavix till ensure tolerating diet advancement.  ID - None  I reviewed nursing notes, last 24 h vitals and pain scores, last 48 h intake and output, last 24 h labs and trends, and last 24 h imaging results.   LOS: 2 days    Jacinto Halim , [facility] 12/10/2022, 11:03 AM Please see Amion for pager number during day hours 7:00am-4:30pm

## 2022-12-10 NOTE — TOC Progression Note (Signed)
Transition of Care [facility]) - Progression Note   Patient Details  Name: Carlos [name] MRN: [id number] Date of Birth: 1940-05-06  [facility] CM/SW Contact  Ewing Schlein, LCSW Phone Number: 12/10/2022, 12:48 PM  Clinical Narrative: Patient is still currently in mitts and cannot return to memory care until he has been out of mitts for 24 hours. CSW spoke with Marcelino Duster [phone]) at [facility] memory care regarding a possible weekend discharge. Per Marcelino Duster, the facility will not accept a weekend return to the unit even if the patient has been out of mitts for 24 hours as "there is no nurse on the weekend" to admit the patient. CSW updated niece/HCPOA and hospitalist.    Expected Discharge Plan: Memory Care Barriers to Discharge: Continued Medical Work up  Expected Discharge Plan and Services Living arrangements for the past 2 months: Assisted Living Facility  Social Determinants of Health (SDOH) Interventions SDOH Screenings   Food Insecurity: No Food Insecurity (08/01/2022)  Housing: Low Risk  (08/01/2022)  Transportation Needs: Patient Unable To Answer (08/01/2022)  Utilities: Not At Risk (08/01/2022)  Tobacco Use: Medium Risk (12/08/2022)   Readmission Risk Interventions    12/09/2022    1:24 PM  Readmission Risk Prevention Plan  Transportation Screening Complete  HRI or Home Care Consult Complete  Social Work Consult for Recovery Care Planning/Counseling Complete  Palliative Care Screening Not Applicable  Medication Review Oceanographer) Complete

## 2022-12-10 NOTE — Progress Notes (Signed)
  Echocardiogram 2D Echocardiogram has been performed.  Carlos [name] 12/10/2022, 8:15 AM

## 2022-12-11 DIAGNOSIS — E876 Hypokalemia: Secondary | ICD-10-CM | POA: Insufficient documentation

## 2022-12-11 DIAGNOSIS — K4030 Unilateral inguinal hernia, with obstruction, without gangrene, not specified as recurrent: Secondary | ICD-10-CM | POA: Diagnosis not present

## 2022-12-11 DIAGNOSIS — I25118 Atherosclerotic heart disease of native coronary artery with other forms of angina pectoris: Secondary | ICD-10-CM | POA: Diagnosis not present

## 2022-12-11 DIAGNOSIS — E782 Mixed hyperlipidemia: Secondary | ICD-10-CM

## 2022-12-11 DIAGNOSIS — K56609 Unspecified intestinal obstruction, unspecified as to partial versus complete obstruction: Secondary | ICD-10-CM | POA: Diagnosis not present

## 2022-12-11 DIAGNOSIS — F03911 Unspecified dementia, unspecified severity, with agitation: Secondary | ICD-10-CM

## 2022-12-11 LAB — RENAL FUNCTION PANEL
Albumin: 3.4 g/dL — ABNORMAL LOW (ref 3.5–5.0)
Anion gap: 6 (ref 5–15)
BUN: 9 mg/dL (ref 8–23)
CO2: 26 mmol/L (ref 22–32)
Calcium: 8.8 mg/dL — ABNORMAL LOW (ref 8.9–10.3)
Chloride: 106 mmol/L (ref 98–111)
Creatinine, Ser: 0.71 mg/dL (ref 0.61–1.24)
GFR, Estimated: >60 mL/min (ref >60)
Glucose, Bld: 114 mg/dL — ABNORMAL HIGH (ref 70–99)
Phosphorus: 1.7 mg/dL — ABNORMAL LOW (ref 2.5–4.6)
Potassium: 3.3 mmol/L — ABNORMAL LOW (ref 3.5–5.1)
Sodium: 138 mmol/L (ref 135–145)

## 2022-12-11 LAB — MAGNESIUM: Magnesium: 2 mg/dL (ref 1.7–2.4)

## 2022-12-11 MED ORDER — SODIUM CHLORIDE 0.9% FLUSH
3.0000 mL | Freq: Two times a day (BID) | INTRAVENOUS | Status: DC
  Administered 2022-12-11: 3 mL via INTRAVENOUS

## 2022-12-11 MED ORDER — DEXTROSE-SODIUM CHLORIDE 5-0.45 % IV SOLN: INTRAVENOUS | Status: DC

## 2022-12-11 MED ORDER — SIMVASTATIN 20 MG PO TABS
40.0000 mg | ORAL_TABLET | Freq: Every day | ORAL | Status: DC
  Administered 2022-12-11 – 2022-12-12 (×2): 40 mg via ORAL
  Filled 2022-12-11 (×2): qty 2

## 2022-12-11 NOTE — Progress Notes (Signed)
Subjective: CC: Received haldol again. Sleeping this am. No n/v with NGT out.    Objective: Vital signs in last 24 hours: Temp:  [97.8 F (36.6 C)-99.1 F (37.3 C)] 98.6 F (37 C) (10/19 0615) Pulse Rate:  [65-89] 65 (10/19 0615) Resp:  [16-18] 16 (10/19 0615) BP: (102-145)/(68-73) 145/70 (10/19 0615) SpO2:  [93 %-100 %] 100 % (10/19 0615) Last BM Date : 12/10/22  Intake/Output from previous day: 10/18 0701 - 10/19 0700 In: 3322 [P.O.:690; I.V.:2632] Out: -  Intake/Output this shift: No intake/output data recorded.  PE: Gen:  sleeping.  Abd: Soft, ND, NT, +BS.  LIH reduced and NT - no overlying skin changes.   Lab Results:  Recent Labs    12/08/22 1428 12/09/22 0425  WBC 11.3* 6.9  HGB 13.6 11.4*  HCT 42.0 36.3*  PLT 236 191   BMET Recent Labs    12/10/22 0429 12/11/22 0458  NA 139 138  K 3.3* 3.3*  CL 106 106  CO2 26 26  GLUCOSE 92 114*  BUN 16 9  CREATININE 0.67 0.71  CALCIUM 8.6* 8.8*   PT/INR No results for input(s): "LABPROT", "INR" in the last 72 hours. CMP     Component Value Date/Time   NA 138 12/11/2022 0458   K 3.3 (L) 12/11/2022 0458   CL 106 12/11/2022 0458   CO2 26 12/11/2022 0458   GLUCOSE 114 (H) 12/11/2022 0458   BUN 9 12/11/2022 0458   CREATININE 0.71 12/11/2022 0458   CALCIUM 8.8 (L) 12/11/2022 0458   PROT 7.3 12/08/2022 1428   ALBUMIN 3.4 (L) 12/11/2022 0458   AST 26 12/08/2022 1428   ALT 29 12/08/2022 1428   ALKPHOS 61 12/08/2022 1428   BILITOT 1.6 (H) 12/08/2022 1428   GFRNONAA >60 12/11/2022 0458   GFRAA >90 05/14/2013 0533   Lipase  No results found for: "LIPASE"  Studies/Results: ECHOCARDIOGRAM COMPLETE  Result Date: 12/10/2022    ECHOCARDIOGRAM REPORT   Patient Name:   Carlos [name] Date of Exam: 12/10/2022 Medical Rec #:  [id number]        Height:       71.0 in Accession #:    [id number]       Weight:       143.3 lb Date of Birth:  08/18/1940         BSA:          1.830 m Patient Age:    82 years          BP:           126/104 mmHg Patient Gender: M                HR:           82 bpm. Exam Location:  Inpatient Procedure: 2D Echo and Cardiac Doppler STAT ECHO Indications:    Z01.818 Encounter for other preprocedural examination  History:        Patient has no prior history of Echocardiogram examinations. CAD                 and Previous Myocardial Infarction, Signs/Symptoms:Alzheimer's                 and Altered Mental Status; Risk Factors:Dyslipidemia.  Sonographer:    Sheralyn Boatman RDCS Referring Phys: [id number] TRACI R TURNER  Sonographer Comments: Image acquisition challenging due to uncooperative patient and Image acquisition challenging due to patient behavioral factors. Patient could not lie still  for exam. Patient could not stop pushing probe off chest and pushing me away. Exam ended. IMPRESSIONS  1. Very limited exam, only a few parasternal long axis images were obtained and then exam was ended due to patient being uncooperative and pushing probe away. From limited images, appears grossly normal LV systolic function.  2. Aortic valve is clearly stenotic, however no gradients could be obtained.  3. Suggest repeating exam when patient more cooperative FINDINGS  Left Ventricle: Left ventricular ejection fraction, by estimation, is 60 to 65%. The left ventricle has normal function. Left ventricular endocardial border not optimally defined to evaluate regional wall motion. The left ventricular internal cavity size was normal in size. There is mild left ventricular hypertrophy. Left ventricular diastolic function could not be evaluated. Right Ventricle: The right ventricular size is not assessed. Not assessed. Right ventricular systolic function not assessed. Left Atrium: Left atrial size was not assessed. Right Atrium: Right atrial size was not assessed. Pericardium: Trivial pericardial effusion is present. Mitral Valve: The mitral valve is normal in structure. Trivial mitral valve regurgitation. Tricuspid Valve: The  tricuspid valve is not assessed. Tricuspid valve regurgitation not assessed. Aortic Valve: Aortic valve is clearly stenotic, however no gradients could be obtained. The aortic valve was not assessed. Aortic valve regurgitation not assessed. Pulmonic Valve: The pulmonic valve was not assessed. Pulmonic valve regurgitation not assessed. Aorta: Aortic root could not be assessed. IAS/Shunts: The interatrial septum was not assessed.  LEFT VENTRICLE PLAX 2D LVIDd:         4.40 cm LVIDs:         3.30 cm LV PW:         1.10 cm LV IVS:        1.20 cm LVOT diam:     2.50 cm LVOT Area:     4.91 cm   SHUNTS Systemic Diam: 2.50 cm Epifanio Lesches MD Electronically signed by Epifanio Lesches MD Signature Date/Time: 12/10/2022/10:45:54 AM    Final     Anti-infectives: Anti-infectives (From admission, onward)    None        Assessment/Plan SBO 2/2 incarcerated LIH - now reduced - LIH remains reduced and patient with return of bowel function. NGT out. No vomiting overnight. Ok to advance diet as tolerated.  - No indication for emergency surgery. Cardiology following - high risk per cards. -Given he is high risk, hopefully can get him over this admission without surgery.   He could follow up with [place] electively if he/family would like to discuss surgery, understanding the risk. Recommend truss belt.   FEN - CLD, IVF per primary VTE - SCDs, lovenox. Hold plavix till ensure tolerating diet advancement.  ID - None  I reviewed nursing notes, last 24 h vitals and pain scores, last 48 h intake and output, last 24 h labs and trends, and last 24 h imaging results.   LOS: 3 days   Maudry Diego, MD, FACS, FSSO Surgical Oncology, General Surgery, Trauma and Critical [facility], [place] [phone] for weekday/non holidays Check [email or web address] for coverage night/weekend/holidays

## 2022-12-11 NOTE — Progress Notes (Signed)
Progress Note  Patient Name: Carlos [name] Date of Encounter: 12/11/2022 Primary Cardiologist: None   Subjective   Overnight a very limited echo was performed related to patient issues. Patient notes no chest pain.  Noes abdominal pain on palpitation.  Vital Signs    Vitals:   12/10/22 0556 12/10/22 1317 12/10/22 2135 12/11/22 0615  BP: (!) 126/104 102/68 (!) 145/73 (!) 145/70  Pulse: 69 80 89 65  Resp: 18 18 16 16   Temp: 98.1 F (36.7 C) 97.8 F (36.6 C) 99.1 F (37.3 C) 98.6 F (37 C)  TempSrc: Oral Oral Oral Oral  SpO2: 100% 99% 93% 100%  Weight:      Height:        Intake/Output Summary (Last 24 hours) at 12/11/2022 0742 Last data filed at 12/11/2022 0641 Gross per 24 hour  Intake 3322 ml  Output --  Net 3322 ml   Filed Weights   12/08/22 1359  Weight: 65 kg    Physical Exam   GEN: No acute distress.  Appears chronically ill Neck: No JVD  Cardiac: RRR, no rubs, or gallops.  Systolic murmur noted  Respiratory: Clear to auscultation bilaterally. GI: Soft, noted pain on abdominal exam (only when prompted)  Labs   Telemetry: none   Chemistry Recent Labs  Lab 12/08/22 1428 12/09/22 0425 12/10/22 0429 12/11/22 0458  NA 144 143 139 138  K 4.3 3.3* 3.3* 3.3*  CL 108 112* 106 106  CO2 23 23 26 26   GLUCOSE 111* 85 92 114*  BUN 40* 32* 16 9  CREATININE 1.01 0.83 0.67 0.71  CALCIUM 9.5 8.4* 8.6* 8.8*  PROT 7.3  --   --   --   ALBUMIN 4.2  --   --  3.4*  AST 26  --   --   --   ALT 29  --   --   --   ALKPHOS 61  --   --   --   BILITOT 1.6*  --   --   --   GFRNONAA >60 >60 >60 >60  ANIONGAP 13 8 7 6      Hematology Recent Labs  Lab 12/08/22 1428 12/09/22 0425  WBC 11.3* 6.9  RBC 4.45 3.74*  HGB 13.6 11.4*  HCT 42.0 36.3*  MCV 94.4 97.1  MCH 30.6 30.5  MCHC 32.4 31.4  RDW 13.8 13.6  PLT 236 191    Cardiac EnzymesNo results for input(s): "TROPONINI" in the last 168 hours. No results for input(s): "TROPIPOC" in the last 168 hours.    BNPNo results for input(s): "BNP", "PROBNP" in the last 168 hours.   DDimer No results for input(s): "DDIMER" in the last 168 hours.   Cardiac Studies   Cardiac Studies & Procedures       ECHOCARDIOGRAM  ECHOCARDIOGRAM COMPLETE 12/10/2022  Narrative ECHOCARDIOGRAM REPORT    Patient Name:   Carlos [name] Date of Exam: 12/10/2022 Medical Rec #:  [id number]        Height:       71.0 in Accession #:    [id number]       Weight:       143.3 lb Date of Birth:  September 28, 1940         BSA:          1.830 m Patient Age:    82 years         BP:           126/104 mmHg Patient Gender: M  HR:           82 bpm. Exam Location:  Inpatient  Procedure: 2D Echo and Cardiac Doppler  STAT ECHO  Indications:    Z01.818 Encounter for other preprocedural examination  History:        Patient has no prior history of Echocardiogram examinations. CAD and Previous Myocardial Infarction, Signs/Symptoms:Alzheimer's and Altered Mental Status; Risk Factors:Dyslipidemia.  Sonographer:    Sheralyn Boatman RDCS Referring Phys: [phone] TRACI R TURNER   Sonographer Comments: Image acquisition challenging due to uncooperative patient and Image acquisition challenging due to patient behavioral factors. Patient could not lie still for exam. Patient could not stop pushing probe off chest and pushing me away. Exam ended. IMPRESSIONS   1. Very limited exam, only a few parasternal long axis images were obtained and then exam was ended due to patient being uncooperative and pushing probe away. From limited images, appears grossly normal LV systolic function. 2. Aortic valve is clearly stenotic, however no gradients could be obtained. 3. Suggest repeating exam when patient more cooperative  FINDINGS Left Ventricle: Left ventricular ejection fraction, by estimation, is 60 to 65%. The left ventricle has normal function. Left ventricular endocardial border not optimally defined to evaluate regional wall motion.  The left ventricular internal cavity size was normal in size. There is mild left ventricular hypertrophy. Left ventricular diastolic function could not be evaluated.  Right Ventricle: The right ventricular size is not assessed. Not assessed. Right ventricular systolic function not assessed.  Left Atrium: Left atrial size was not assessed.  Right Atrium: Right atrial size was not assessed.  Pericardium: Trivial pericardial effusion is present.  Mitral Valve: The mitral valve is normal in structure. Trivial mitral valve regurgitation.  Tricuspid Valve: The tricuspid valve is not assessed. Tricuspid valve regurgitation not assessed.  Aortic Valve: Aortic valve is clearly stenotic, however no gradients could be obtained. The aortic valve was not assessed. Aortic valve regurgitation not assessed.  Pulmonic Valve: The pulmonic valve was not assessed. Pulmonic valve regurgitation not assessed.  Aorta: Aortic root could not be assessed.  IAS/Shunts: The interatrial septum was not assessed.   LEFT VENTRICLE PLAX 2D LVIDd:         4.40 cm LVIDs:         3.30 cm LV PW:         1.10 cm LV IVS:        1.20 cm LVOT diam:     2.50 cm LVOT Area:     4.91 cm    SHUNTS Systemic Diam: 2.50 cm  Epifanio Lesches MD Electronically signed by Epifanio Lesches MD Signature Date/Time: 12/10/2022/10:45:54 AM    Final                  Assessment & Plan   Preoperative risk assessment -Given his advanced dementia, AS, coronary artery disease, he is at high risk for planned surgery for SBO/incarcerated hernia - family had noted no cardiac symptoms on 10/18 - Echo shows evidence of aortic stenosis but is not well characterized- this find may slightly increase his risk from assessment 12/12/22 - he appears to be asymptomatic; due to his dementia no procedural interventions are recommend for optimizations/characterizations of his AS, medication optimization has been planned as  below - despite this, if patient has few other options, surgery may be reasonable, will defer treament plan surgery team - Attempted to call his niece [name] and nephew Nadine Counts to review findings, left VM the his echocardiogram showed normal function  CAD s/p circumflex stent 2015 -Has been stable from cardiac standpoint since PCI per report - BB added by Dr. Antoine Poche 12/10/22 - OK to hold plavix if surgery is planned (hoping to avoid per Dr. Derrell Lolling note 10/18)  Will chart check on 12/12/22, largely to see if there are cardiac questions from family  For questions or updates, please contact [facility] Please consult [email or web address] for contact info under Cardiology/STEMI.      Riley Lam, MD FASE [facility] Cardiologist [facility]  [address] [place], [address] [place], [place] [address] [phone]  7:42 AM

## 2022-12-11 NOTE — Plan of Care (Signed)

## 2022-12-11 NOTE — Hospital Course (Signed)
Carlos [name] is an 82 y.o. M with dementia lives in memory care, CAD who presented with nausea and feculent vomit, found to have an incarcerated left inguinal hernia causing SBO.   NG tube placed, hernia reduced by surgery on admission. Admitted for SBO.  Gen Surg consulted.  Cardiology consulted for preoperative cardiac optimization.

## 2022-12-11 NOTE — Progress Notes (Signed)
  Progress Note   Patient: Carlos [name] PJK:[id number] DOB: Apr 17, 1940 DOA: 12/08/2022     3 DOS: the patient was seen and examined on 12/11/2022 at 11:06AM      Brief hospital course: Carlos [name] is an 82 y.o. M with dementia lives in memory care, CAD who presented with nausea and feculent vomit, found to have an incarcerated left inguinal hernia causing SBO.   NG tube placed, hernia reduced by surgery on admission. Admitted for SBO.  Gen Surg consulted.  Cardiology consulted for preoperative cardiac optimization.       Assessment and Plan: Small bowel obstruction due to incarcerated left inguinal hernia NG tube has been removed.  Family reported bowel movements, and stool incontinence is noted in the flowsheets.  No vomiting or reports of abdominal pain. -Advance diet per general surgery   Advanced dementia Acute toxic and metabolic encephalopathy At baseline the patient has advanced dementia, but at least is responsive to questions.  At present he is severely encephalopathic.  This is likely dementia from hospitalization, sleep deprivation, compounded by Haldol - Standard delirium precautions: blinds open and lights on during day, TV off, minimize interruptions at night, glasses/hearing aids, PT/OT, avoiding Beers list medications   -Continue nightly Seroquel  Coronary artery disease Hyperlipidemia -Plavix is on hold due to possible surgery - Continue metoprolol - Resume simvastatin   Hypokalemia Hypophosphatemia K and phos supplemented          Subjective: The patient is quite sluggish and sedated overnight and this morning probably due to Haldol.  He has had no fever, no respiratory distress.  He wakes up groggy for nursing, has not had any vomiting or complaints of pain.     Physical Exam: BP 125/73 (BP Location: Right Arm)   Pulse 65   Temp 98.6 F (37 C) (Oral)   Resp 16   Ht 5\' 11"  (1.803 m)   Wt 65 kg   SpO2 99%   BMI 19.99 kg/m   Frail  elderly adult male, lying in bed, sluggish and poorly responsive, listlessly opens his eyes, then closes them, does not follow commands Heart rate slow, regular, no murmurs, no peripheral edema Respiratory rate normal, lungs clear without rales or wheezes No grimace to palpation of the abdomen which is soft and nondistended Inattentive, sluggish, severely weak, does not follow commands    Data Reviewed: Potassium and phosphate low Creatinine okay Echocardiogram shows poor images, overall normal function CBC unremarkable  Family Communication: Nephew at the bedside, niece by phone    Disposition: Status is: Inpatient         Author: Alberteen Sam, MD 12/11/2022 2:02 PM  For on call review [email or web address].

## 2022-12-11 NOTE — Plan of Care (Signed)
  Problem: Activity: Goal: Risk for activity intolerance will decrease Outcome: Progressing   

## 2022-12-12 DIAGNOSIS — K4030 Unilateral inguinal hernia, with obstruction, without gangrene, not specified as recurrent: Secondary | ICD-10-CM | POA: Diagnosis not present

## 2022-12-12 DIAGNOSIS — K56609 Unspecified intestinal obstruction, unspecified as to partial versus complete obstruction: Secondary | ICD-10-CM | POA: Diagnosis not present

## 2022-12-12 DIAGNOSIS — G9341 Metabolic encephalopathy: Secondary | ICD-10-CM | POA: Diagnosis not present

## 2022-12-12 DIAGNOSIS — I25118 Atherosclerotic heart disease of native coronary artery with other forms of angina pectoris: Secondary | ICD-10-CM | POA: Diagnosis not present

## 2022-12-12 LAB — COMPREHENSIVE METABOLIC PANEL
ALT: 28 U/L (ref 0–44)
AST: 37 U/L (ref 15–41)
Albumin: 3.5 g/dL (ref 3.5–5.0)
Alkaline Phosphatase: 61 U/L (ref 38–126)
Anion gap: 8 (ref 5–15)
BUN: 10 mg/dL (ref 8–23)
CO2: 28 mmol/L (ref 22–32)
Calcium: 9.3 mg/dL (ref 8.9–10.3)
Chloride: 108 mmol/L (ref 98–111)
Creatinine, Ser: 0.64 mg/dL (ref 0.61–1.24)
GFR, Estimated: >60 mL/min (ref >60)
Glucose, Bld: 91 mg/dL (ref 70–99)
Potassium: 3.5 mmol/L (ref 3.5–5.1)
Sodium: 144 mmol/L (ref 135–145)
Total Bilirubin: 0.8 mg/dL (ref 0.3–1.2)
Total Protein: 6.4 g/dL — ABNORMAL LOW (ref 6.5–8.1)

## 2022-12-12 LAB — CBC
HCT: 41.4 % (ref 39.0–52.0)
Hemoglobin: 13.4 g/dL (ref 13.0–17.0)
MCH: 30.4 pg (ref 26.0–34.0)
MCHC: 32.4 g/dL (ref 30.0–36.0)
MCV: 93.9 fL (ref 80.0–100.0)
Platelets: 251 10*3/uL (ref 150–400)
RBC: 4.41 MIL/uL (ref 4.22–5.81)
RDW: 13.2 % (ref 11.5–15.5)
WBC: 5.6 10*3/uL (ref 4.0–10.5)
nRBC: 0 % (ref 0.0–0.2)

## 2022-12-12 LAB — MAGNESIUM: Magnesium: 2.1 mg/dL (ref 1.7–2.4)

## 2022-12-12 LAB — PHOSPHORUS: Phosphorus: 2.3 mg/dL — ABNORMAL LOW (ref 2.5–4.6)

## 2022-12-12 MED ORDER — K PHOS MONO-SOD PHOS DI & MONO 155-852-130 MG PO TABS
500.0000 mg | ORAL_TABLET | Freq: Once | ORAL | Status: AC
  Administered 2022-12-12: 500 mg via ORAL
  Filled 2022-12-12: qty 2

## 2022-12-12 NOTE — Progress Notes (Signed)
       Subjective: CC: Did not require haldol yesterday.  No n/v recorded and no n/v meds given  Objective: Vital signs in last 24 hours: Temp:  [97.5 F (36.4 C)-98.6 F (37 C)] 97.5 F (36.4 C) (10/20 0606) Pulse Rate:  [57-65] 57 (10/20 0606) Resp:  [12-16] 12 (10/20 0606) BP: (125-149)/(71-84) 149/71 (10/20 0606) SpO2:  [99 %-100 %] 100 % (10/20 0606) Last BM Date : 12/10/22  Intake/Output from previous day: 10/19 0701 - 10/20 0700 In: 120 [P.O.:120] Out: 300 [Urine:300] Intake/Output this shift: No intake/output data recorded.  PE: Gen:  sleeping. No change.  Abd: Soft, ND, NT, +BS.  LIH reduced and NT - no overlying skin changes.   Lab Results:  Recent Labs    12/12/22 0441  WBC 5.6  HGB 13.4  HCT 41.4  PLT 251   BMET Recent Labs    12/11/22 0458 12/12/22 0441  NA 138 144  K 3.3* 3.5  CL 106 108  CO2 26 28  GLUCOSE 114* 91  BUN 9 10  CREATININE 0.71 0.64  CALCIUM 8.8* 9.3   PT/INR No results for input(s): "LABPROT", "INR" in the last 72 hours. CMP     Component Value Date/Time   NA 144 12/12/2022 0441   K 3.5 12/12/2022 0441   CL 108 12/12/2022 0441   CO2 28 12/12/2022 0441   GLUCOSE 91 12/12/2022 0441   BUN 10 12/12/2022 0441   CREATININE 0.64 12/12/2022 0441   CALCIUM 9.3 12/12/2022 0441   PROT 6.4 (L) 12/12/2022 0441   ALBUMIN 3.5 12/12/2022 0441   AST 37 12/12/2022 0441   ALT 28 12/12/2022 0441   ALKPHOS 61 12/12/2022 0441   BILITOT 0.8 12/12/2022 0441   GFRNONAA >60 12/12/2022 0441   GFRAA >90 05/14/2013 0533   Lipase  No results found for: "LIPASE"  Studies/Results: No results found.  Anti-infectives: Anti-infectives (From admission, onward)    None        Assessment/Plan SBO 2/2 incarcerated LIH - now reduced, SBO resolved.   - LIH remains reduced and patient with return of bowel function. Diet as tolerated per primary team.  - No indication for emergency surgery. Cardiology following - high risk per cards.  -Given he is high risk, hopefully can get him over this admission without surgery.   He could follow up with [place] electively if he/family would like to discuss surgery, understanding the risk. Recommend truss belt.   FEN - FLD, ok to advance if awake. IVF per primary VTE - SCDs, lovenox. Hold plavix till ensure tolerating diet advancement.  ID - None  Call for further issues.    I reviewed nursing notes, last 24 h vitals and pain scores, last 48 h intake and output, last 24 h labs and trends, and last 24 h imaging results.   LOS: 4 days   Maudry Diego, MD, FACS, FSSO Surgical Oncology, General Surgery, Trauma and Critical [facility] Surgery, [place] [phone] for weekday/non holidays Check [email or web address] for coverage night/weekend/holidays

## 2022-12-12 NOTE — Plan of Care (Signed)
  Problem: Clinical Measurements: Goal: Ability to maintain clinical measurements within normal limits will improve Outcome: Progressing Goal: Will remain free from infection Outcome: Progressing   Problem: Activity: Goal: Risk for activity intolerance will decrease Outcome: Progressing   Problem: Nutrition: Goal: Adequate nutrition will be maintained Outcome: Progressing   Problem: Coping: Goal: Level of anxiety will decrease Outcome: Progressing   

## 2022-12-12 NOTE — Progress Notes (Signed)
  Progress Note   Patient: Carlos [name] [name] DOB: [birth year] DOA: 12/08/2022     4 DOS: the patient was seen and examined on 12/12/2022 at 10:20AM      Brief hospital course: Carlos [name] is an 82 y.o. M with dementia lives in memory care, CAD who presented with nausea and feculent vomit, found to have an incarcerated left inguinal hernia causing SBO.   NG tube placed, hernia reduced by surgery on admission. Admitted for SBO.  Gen Surg consulted.  Cardiology consulted for preoperative cardiac optimization.       Assessment and Plan: Small bowel obstruction due to incarcerated inguinal hernia Doing well with oral intake. - Carlos [name] - Outpatient follow-up with general surgery   Advanced dementia -Continue nightly Seroquel  Encephalopathy, resolved  Coronary artery disease Hyperlipidemia Aortic stenosis Discussed with cardiology.  He has RCRI 2 which confers some additional risk of perioperative MI.  In addition, echo seems to show worsening aortic stenosis, which confers additional perioperative risk.  However, both of these are optimized, and neither of these would be a contraindication to a needed surgery, if Carlos [name] did not work.  - Plan to resume Plavix at discharge - Continue simvastatin and metoprolol  Hypophosphatemia - Supplement phosphate     Subjective: Patient has advanced dementia, he makes no complaints.  Nursing of no concerns.  No fever, no respiratory symptoms, no vomiting.     Physical Exam: BP (!) 149/71 (BP Location: Right Arm)   Pulse (!) 57   Temp (!) 97.5 F (36.4 C) (Oral)   Resp 12   Ht 5\' 11"  (1.803 m)   Wt 65 kg   SpO2 100%   BMI 19.99 kg/m   Frail elderly adult male, lying in bed, sleepy Heart rate slow, regular, no peripheral edema, no murmurs Respiratory rate normal, lungs without rales or wheezes No grimace to palpation of the abdomen, no rigidity or distention Inattentive, sleepy, awake, does not cooperate much  with exam    Data Reviewed: Discussed with cardiology Comprehensive metabolic panel normal Phosphorus level low Magnesium normal CBC normal   Family Communication:     Disposition: Status is: Inpatient Medically ready for discharge, facility cannot accept him over the weekend.        Author: Alberteen Sam, MD 12/12/2022 12:35 PM  For on call review [email or web address].

## 2022-12-13 ENCOUNTER — Inpatient Hospital Stay (HOSPITAL_COMMUNITY): Payer: Medicare Other

## 2022-12-13 DIAGNOSIS — K56609 Unspecified intestinal obstruction, unspecified as to partial versus complete obstruction: Secondary | ICD-10-CM | POA: Diagnosis not present

## 2022-12-13 MED ORDER — METOPROLOL TARTRATE 25 MG PO TABS: 12.5000 mg | ORAL_TABLET | Freq: Two times a day (BID) | ORAL | 3 refills | Status: AC

## 2022-12-13 NOTE — Evaluation (Signed)
Physical Therapy Evaluation Patient Details Name: Carlos [name] MRN: [id number] DOB: 01-Feb-1941 Today's Date: 12/13/2022  History of Present Illness  82 y.o. M  lives in memory care, presented with nausea and feculent vomit, found to have an incarcerated left inguinal hernia causing SBO. PMH: CAD, GERD, hyperlipidemia, advanced dementia  Clinical Impression  Pt admitted with above diagnosis.  Pt amb ~ 120' with RW and min assist to [facility] for safety. Pt likely can return to memory care setting as I suspect he is near his baseline, defer to [facility] to confirm with memory care. Continue PT in acute setting  Pt currently with functional limitations due to the deficits listed below (see PT Problem List). Pt will benefit from acute skilled PT to increase their independence and safety with mobility to allow discharge.           If plan is discharge home, recommend the following: A little help with walking and/or transfers;A little help with bathing/dressing/bathroom;Direct supervision/assist for financial management;Help with stairs or ramp for entrance;Assist for transportation   Can travel by private vehicle        Equipment Recommendations None recommended by PT  Recommendations for Other Services       Functional Status Assessment Patient has had a recent decline in their functional status and demonstrates the ability to make significant improvements in function in a reasonable and predictable amount of time.     Precautions / Restrictions Precautions Precautions: Fall Restrictions Weight Bearing Restrictions: No      Mobility  Bed Mobility Overal bed mobility: Needs Assistance Bed Mobility: Supine to Sit     Supine to sit: Min assist     General bed mobility comments: assist to guide trunk to sitting, to lift LEs on to bed; pt initially tries to crawl into bed knees first    Transfers Overall transfer level: Needs assistance Equipment used: Rolling walker (2  wheels) Transfers: Sit to/from Stand Sit to Stand: Contact guard assist, Min assist           General transfer comment: light assist to rise and transition to RW    Ambulation/Gait Ambulation/Gait assistance: Contact guard assist, Min assist Gait Distance (Feet): 120 Feet Assistive device: Rolling walker (2 wheels), None Gait Pattern/deviations: Step-through pattern, Decreased stride length, Narrow base of support, Trunk flexed       General Gait Details: cues for posture, to slow speed of movement and for  proximity to RW, pt tends to push walker too far ahead of self; steadying assist mostly with turns  Stairs            Wheelchair Mobility     Tilt Bed    Modified Rankin (Stroke Patients Only)       Balance Overall balance assessment: Needs assistance Sitting-balance support: Feet supported, Single extremity supported, No upper extremity supported Sitting balance-Leahy Scale: Fair     Standing balance support: During functional activity, Reliant on assistive device for balance Standing balance-Leahy Scale: Poor                               Pertinent Vitals/Pain Pain Assessment Pain Assessment: No/denies pain    Home Living Family/patient expects to be discharged to:: Skilled nursing facility                   Additional Comments: from memory care    Prior Function Prior Level of Function : Patient poor historian/Family  not available                     Extremity/Trunk Assessment   Upper Extremity Assessment Upper Extremity Assessment: Generalized weakness    Lower Extremity Assessment Lower Extremity Assessment: Generalized weakness       Communication      Cognition Arousal: Alert Behavior During Therapy: WFL for tasks assessed/performed Overall Cognitive Status: Within Functional Limits for tasks assessed                                          General Comments      Exercises      Assessment/Plan    PT Assessment Patient needs continued PT services  PT Problem List Decreased strength;Decreased activity tolerance;Decreased balance;Decreased mobility;Decreased safety awareness       PT Treatment Interventions DME instruction;Balance training;Gait training;Therapeutic activities;Functional mobility training;Therapeutic exercise    PT Goals (Current goals can be found in the Care Plan section)  Acute Rehab PT Goals PT Goal Formulation: Patient unable to participate in goal setting Time For Goal Achievement: 12/27/22 Potential to Achieve Goals: Good    Frequency Min 1X/week     Co-evaluation               AM-PAC PT "6 Clicks" Mobility  Outcome Measure Help needed turning from your back to your side while in a flat bed without using bedrails?: A Little Help needed moving from lying on your back to sitting on the side of a flat bed without using bedrails?: A Little Help needed moving to and from a bed to a chair (including a wheelchair)?: A Little Help needed standing up from a chair using your arms (e.g., wheelchair or bedside chair)?: A Little Help needed to walk in hospital room?: A Little Help needed climbing 3-5 steps with a railing? : A Lot 6 Click Score: 17    End of Session Equipment Utilized During Treatment: Gait belt Activity Tolerance: Patient tolerated treatment well Patient left: in bed;with call bell/phone within reach;with bed alarm set Nurse Communication: Mobility status;Other (comment) (pt had pulled out IV on PT arrival, no bleeding noted; pt set up for breakfast) PT Visit Diagnosis: Other abnormalities of gait and mobility (R26.89);Unsteadiness on feet (R26.81);Muscle weakness (generalized) (M62.81)    Time: 1324-4010 PT Time Calculation (min) (ACUTE ONLY): 17 min   Charges:   PT Evaluation $PT Eval Low Complexity: 1 Low   PT General Charges $$ ACUTE PT VISIT: 1 Visit         Logun Colavito, PT  Acute Rehab Dept [facility])  [phone]  12/13/2022   [facility] 12/13/2022, 10:04 AM

## 2022-12-13 NOTE — TOC Transition Note (Signed)
Transition of Care [facility]) - CM/SW Discharge Note  Patient Details  Name: Carlos [name] MRN: [id number] Date of Birth: 09-17-1940  [facility] CM/SW Contact:  Ewing Schlein, LCSW Phone Number: 12/13/2022, 3:28 PM  Clinical Narrative: Patient has been out of restraints for over 24 hours and is medically stable to return to [facility] memory care. CSW spoke with Dawn at the facility to have the patient assessed for return. Discharge summary and FL2 faxed to facility [phone]). Chip Boer and niece unable to transport patient back. CSW set up [employer]. Niece signed rider waiver, which was placed on shadow chart. TOC signing off.  Final next level of care: Memory Care Barriers to Discharge: Barriers Resolved  Patient Goals and CMS Choice Choice offered to / list presented to : NA  Discharge Plan and Services Additional resources added to the After Visit Summary for         DME Arranged: N/A DME Agency: NA  Social Determinants of Health (SDOH) Interventions SDOH Screenings   Food Insecurity: Patient Unable To Answer (12/11/2022)  Housing: High Risk (12/11/2022)  Transportation Needs: Patient Unable To Answer (12/11/2022)  Utilities: Not At Risk (12/11/2022)  Tobacco Use: Medium Risk (12/08/2022)   Readmission Risk Interventions    12/09/2022    1:24 PM  Readmission Risk Prevention Plan  Transportation Screening Complete  HRI or Home Care Consult Complete  Social Work Consult for Recovery Care Planning/Counseling Complete  Palliative Care Screening Not Applicable  Medication Review [employer]) Complete

## 2022-12-13 NOTE — Progress Notes (Signed)
Spoke with Dr. Maryfrances Bunnell regarding this patient.  Seems to be doing well and tolerating his diet advancement.  We will be available as needed.  Letha Cape 9:40 AM 12/13/2022

## 2022-12-13 NOTE — [place]2 (Signed)
[place] MEDICAID FL2 LEVEL OF CARE FORM     IDENTIFICATION  Patient Name: Carlos [name] Birthdate: [birth year] Sex: male Admission Date (Current Location): 12/08/2022  [facility] and [place] Number:  Producer, television/film/video and Address:  [facility],  [address] [place], [place] [address]      Provider Number: [id number]  Attending Physician Name and Address:  Alberteen Sam, *  Relative Name and Phone Number:  Carlos [name] (niece) Ph: [phone]    Current Level of Care: Hospital Recommended Level of Care: Memory Care Prior Approval Number:    Date Approved/Denied:   PASRR Number:    Discharge Plan: Other (Comment) [facility] memory care)    Current Diagnoses: Patient Active Problem List   Diagnosis Date Noted   Acute toxic and metabolic encephalopathy 12/12/2022   Hypophosphatemia 12/11/2022   Hypokalemia 12/11/2022   SBO (small bowel obstruction) due to incarcerated left inguinal hernia (HCC) 12/08/2022   Incarcerated left inguinal hernia s/p ED bedside reduction 12/08/2022 12/08/2022   Rhabdomyolysis 08/01/2022   Fall at home, initial encounter 08/01/2022   Osteoarthritis of knee 09/29/2021   Other long term (current) drug therapy 09/29/2021   Screening for malignant neoplasm of prostate 09/29/2021   Dementia with agitation (HCC) 09/29/2021   Hypercholesteremia    GERD (gastroesophageal reflux disease)    Angina pectoris (HCC)    Hyperlipidemia 05/14/2013   CAD (coronary artery disease) 05/14/2013   ST elevation myocardial infarction (STEMI) of lateral wall, initial episode of care (HCC) 05/12/2013    Orientation RESPIRATION BLADDER Height & Weight     Self, Place  Normal Incontinent Weight: 143 lb 4.8 oz (65 kg) Height:  5\' 11"  (180.3 cm)  BEHAVIORAL SYMPTOMS/MOOD NEUROLOGICAL BOWEL NUTRITION STATUS      Incontinent Diet (See discharge summary)  AMBULATORY STATUS COMMUNICATION OF NEEDS Skin   Supervision Verbally  Normal                       Personal Care Assistance Level of Assistance  Bathing, Feeding, Dressing Bathing Assistance: Limited assistance Feeding assistance: Independent Dressing Assistance: Limited assistance     Functional Limitations Info  Sight, Hearing, Speech Sight Info: Adequate Hearing Info: Adequate Speech Info: Adequate    SPECIAL CARE FACTORS FREQUENCY                       Contractures Contractures Info: Not present    Additional Factors Info  Code Status, Allergies, Psychotropic Code Status Info: DNR Allergies Info: NKA Psychotropic Info: See discharge summary         Current Medications (12/13/2022):  This is the current hospital active medication list Current Facility-Administered Medications  Medication Dose Route Frequency Provider Last Rate Last Admin   acetaminophen (TYLENOL) tablet 650 mg  650 mg Oral Q6H PRN Kirby Crigler, Mir M, MD       Or   acetaminophen (TYLENOL) suppository 650 mg  650 mg Rectal Q6H PRN Kirby Crigler, Mir M, MD       albuterol (PROVENTIL) (2.5 MG/3ML) 0.083% nebulizer solution 2.5 mg  2.5 mg Nebulization Q2H PRN Kirby Crigler, Mir M, MD       enoxaparin (LOVENOX) injection 40 mg  40 mg Subcutaneous QHS Kirby Crigler, Mir M, MD   40 mg at 12/12/22 2151   fentaNYL (SUBLIMAZE) injection 12.5-50 mcg  12.5-50 mcg Intravenous Q2H PRN Kirby Crigler, Mir M, MD       metoprolol tartrate (LOPRESSOR) tablet 12.5 mg  12.5 mg Oral BID Armanda Magic R, MD   12.5 mg at 12/13/22 1145   ondansetron (ZOFRAN) tablet 4 mg  4 mg Oral Q6H PRN Kirby Crigler, Mir M, MD       Or   ondansetron [facility]) injection 4 mg  4 mg Intravenous Q6H PRN Kirby Crigler, Mir M, MD       oxyCODONE (Oxy IR/ROXICODONE) immediate release tablet 5 mg  5 mg Oral Q4H PRN Kirby Crigler, Mir M, MD       QUEtiapine (SEROQUEL) tablet 25 mg  25 mg Oral QHS Kirby Crigler, Mir M, MD   25 mg at 12/12/22 2150   simvastatin (ZOCOR) tablet 40 mg  40 mg Oral QHS Alberteen Sam, MD   40 mg  at 12/12/22 2150   sodium chloride flush (NS) 0.9 % injection 3 mL  3 mL Intravenous Q12H Alberteen Sam, MD   3 mL at 12/11/22 2153   traZODone (DESYREL) tablet 25 mg  25 mg Oral QHS PRN Maryln Gottron, MD   25 mg at 12/13/22 0036     Discharge Medications: Please see discharge summary for a list of discharge medications.  TAKE these medications     b complex vitamins tablet Take 1 tablet by mouth every Monday.    CALCARB 600 PO Take 1,200 mg by mouth every evening.    calcium carbonate 750 MG chewable tablet Commonly known as: TUMS EX Chew 1 tablet by mouth every 8 (eight) hours as needed for heartburn.    clopidogrel 75 MG tablet Commonly known as: PLAVIX Take 1 tablet (75 mg total) by mouth daily.    docusate sodium 100 MG capsule Commonly known as: COLACE Take 1 capsule (100 mg total) by mouth 2 (two) times daily.    Fish Oil Omega-3 1000 MG Caps Take 1,000 mg by mouth daily.    GLUCOSAMINE PO Take 2 tablets by mouth 2 (two) times daily.    hydrOXYzine 25 MG capsule Commonly known as: VISTARIL Take 25 mg by mouth every 12 (twelve) hours as needed (for agitation).    loperamide 2 MG tablet Commonly known as: IMODIUM A-D Take 2 mg by mouth every 6 (six) hours as needed for diarrhea or loose stools.    metoprolol tartrate 25 MG tablet Commonly known as: LOPRESSOR Take 0.5 tablets (12.5 mg total) by mouth 2 (two) times daily.    multivitamin tablet Take 1 tablet by mouth daily with breakfast.    nitroGLYCERIN 0.4 MG SL tablet Commonly known as: NITROSTAT Place 1 tablet (0.4 mg total) under the tongue every 5 (five) minutes x 3 doses as needed for chest pain. What changed: reasons to take this    ondansetron 4 MG tablet Commonly known as: ZOFRAN Take 4 mg by mouth every 12 (twelve) hours as needed for nausea.    polyethylene glycol 17 g packet Commonly known as: MIRALAX / GLYCOLAX Take 17 g by mouth daily as needed for mild constipation. What  changed: reasons to take this    QUEtiapine 25 MG tablet Commonly known as: SEROQUEL Take 1 tablet (25 mg total) by mouth 2 (two) times daily. What changed: when to take this    simvastatin 40 MG tablet Commonly known as: ZOCOR Take 40 mg by mouth every evening.    vitamin E 180 MG (400 UNITS) capsule Take 400 Units by mouth daily.    Relevant Imaging Results:  Relevant Lab Results:   Additional Information SSN: [id number]  Ewing Schlein, LCSW

## 2022-12-13 NOTE — Discharge Summary (Addendum)
Physician Discharge Summary   Patient: Carlos [name] MRN: [id number] DOB: April 20, 1940  Admit date:     12/08/2022  Discharge date: 12/13/22  Discharge Physician: Alberteen Sam   PCP: Daisy Floro, MD     Recommendations at discharge:  Follow-up with [facility] surgery in 2 to 3 weeks for hernia Obtain and apply truss belt for hernia Discuss need for surgery versus watchful waiting Start new metoprolol for coronary artery disease and aortic stenosis Pureed diet only for now with thin liquids and all meds in puree Consult speech therapy to work with patient      Discharge Diagnoses: Principal Problem:   SBO (small bowel obstruction) due to incarcerated left inguinal hernia (HCC) Active Problems:   Incarcerated left inguinal hernia s/p ED bedside reduction 12/08/2022   Hyperlipidemia   CAD (coronary artery disease)   Dementia with agitation (HCC)   Hypophosphatemia   Hypokalemia   Acute toxic and metabolic encephalopathy   Moderate to severe aortic stenosis      Hospital Course: Carlos [name] is an 82 y.o. M with dementia lives in memory care, CAD who presented with nausea and feculent vomit, found to have an incarcerated left inguinal hernia causing SBO.   NG tube placed, hernia reduced by surgery on admission. Admitted for SBO.  Gen Surg consulted.  Cardiology consulted for preoperative cardiac optimization.      Small bowel obstruction due to incarcerated inguinal hernia The patient was admitted, general surgery were consulted.  NG was placed.  The patient had resumption of bowel function, successfully advance his diet.  Surgery recommended against surgery at this time.  They recommend a truss belt and follow up in the office for discussion of potential surgery only if Truss belt fails.     Advanced dementia Encephalopathy, resolved The patient had normal and expected behaviors for someone with advanced dementia in the hospital.  These would  be best managed at his long-term care facility.    Dysphagia Noted to have some choking with oral intake.  Likely due to dementia, distractions, delayed swallow initiation from dementia.  Follow up CXR clear. - Recommend puree diet texture only, continue SLP, thin liquids okay, meds to be administered in puree    Coronary artery disease Hyperlipidemia Aortic stenosis Discussed with cardiology.  He has RCRI 2 which confers some additional risk of perioperative MI.  In addition, echo seems to show worsening aortic stenosis, which confers additional perioperative risk.  However, both of these are optimized, and neither of these would be a contraindication to a needed surgery, if Truss belt did not work.   Resume Plavix and simvastatin at discharge. Cardiology have started new metoprolol               The [facility] Controlled Substances Registry was reviewed for this patient prior to discharge.   Consultants: Cardiology, General Surgery Procedures performed: NG tube placement   Disposition: Long term care facility Diet recommendation:  Discharge Diet Orders (From admission, onward)     Start     Ordered   12/13/22 0000  Dysphagia 1 diet, puree; thin liquids   12/13/22 1318             DISCHARGE MEDICATION: Allergies as of 12/13/2022   No Known Allergies      Medication List     TAKE these medications    b complex vitamins tablet Take 1 tablet by mouth every Monday.   CALCARB 600 PO Take 1,200 mg by  mouth every evening.   calcium carbonate 750 MG chewable tablet Commonly known as: TUMS EX Chew 1 tablet by mouth every 8 (eight) hours as needed for heartburn.   clopidogrel 75 MG tablet Commonly known as: PLAVIX Take 1 tablet (75 mg total) by mouth daily.   docusate sodium 100 MG capsule Commonly known as: COLACE Take 1 capsule (100 mg total) by mouth 2 (two) times daily.   Fish Oil Omega-3 1000 MG Caps Take 1,000 mg by mouth daily.    GLUCOSAMINE PO Take 2 tablets by mouth 2 (two) times daily.   hydrOXYzine 25 MG capsule Commonly known as: VISTARIL Take 25 mg by mouth every 12 (twelve) hours as needed (for agitation).   loperamide 2 MG tablet Commonly known as: IMODIUM A-D Take 2 mg by mouth every 6 (six) hours as needed for diarrhea or loose stools.   metoprolol tartrate 25 MG tablet Commonly known as: LOPRESSOR Take 0.5 tablets (12.5 mg total) by mouth 2 (two) times daily.   multivitamin tablet Take 1 tablet by mouth daily with breakfast.   nitroGLYCERIN 0.4 MG SL tablet Commonly known as: NITROSTAT Place 1 tablet (0.4 mg total) under the tongue every 5 (five) minutes x 3 doses as needed for chest pain. What changed: reasons to take this   ondansetron 4 MG tablet Commonly known as: ZOFRAN Take 4 mg by mouth every 12 (twelve) hours as needed for nausea.   polyethylene glycol 17 g packet Commonly known as: MIRALAX / GLYCOLAX Take 17 g by mouth daily as needed for mild constipation. What changed: reasons to take this   QUEtiapine 25 MG tablet Commonly known as: SEROQUEL Take 1 tablet (25 mg total) by mouth 2 (two) times daily. What changed: when to take this   simvastatin 40 MG tablet Commonly known as: ZOCOR Take 40 mg by mouth every evening.   vitamin E 180 MG (400 UNITS) capsule Take 400 Units by mouth daily.        Follow-up Information     Daisy Floro, MD. Schedule an appointment as soon as possible for a visit in 1 week(s).   Specialty: Family Medicine Contact information: [address] [place] [address] [phone]                 Discharge Instructions     Diet - low sodium heart healthy   Complete by: As directed    Increase activity slowly   Complete by: As directed        Discharge Exam: Filed Weights   12/08/22 1359  Weight: 65 kg    General: Pt is alert, awake, not in acute distress Cardiovascular: RRR, nl S1-S2, no murmurs  appreciated.   No LE edema.   Respiratory: Normal respiratory rate and rhythm.  CTAB without rales or wheezes. Abdominal: Abdomen soft and non-tender.  No distension or HSM.   Neuro/Psych: Strength symmetric in upper and lower extremities.  Judgment and insight appear severely impaired by dementia.   Condition at discharge: stable  The results of significant diagnostics from this hospitalization (including imaging, microbiology, ancillary and laboratory) are listed below for reference.   Imaging Studies: ECHOCARDIOGRAM COMPLETE  Result Date: 12/10/2022    ECHOCARDIOGRAM REPORT   Patient Name:   NEWT APODACA Date of Exam: 12/10/2022 Medical Rec #:  [id number]        Height:       71.0 in Accession #:    [id number]  Weight:       143.3 lb Date of Birth:  09-Nov-1940         BSA:          1.830 m Patient Age:    82 years         BP:           126/104 mmHg Patient Gender: M                HR:           82 bpm. Exam Location:  Inpatient Procedure: 2D Echo and Cardiac Doppler STAT ECHO Indications:    Z01.818 Encounter for other preprocedural examination  History:        Patient has no prior history of Echocardiogram examinations. CAD                 and Previous Myocardial Infarction, Signs/Symptoms:Alzheimer's                 and Altered Mental Status; Risk Factors:Dyslipidemia.  Sonographer:    Sheralyn Boatman RDCS Referring Phys: [phone] TRACI R TURNER  Sonographer Comments: Image acquisition challenging due to uncooperative patient and Image acquisition challenging due to patient behavioral factors. Patient could not lie still for exam. Patient could not stop pushing probe off chest and pushing me away. Exam ended. IMPRESSIONS  1. Very limited exam, only a few parasternal long axis images were obtained and then exam was ended due to patient being uncooperative and pushing probe away. From limited images, appears grossly normal LV systolic function.  2. Aortic valve is clearly stenotic, however no gradients  could be obtained.  3. Suggest repeating exam when patient more cooperative FINDINGS  Left Ventricle: Left ventricular ejection fraction, by estimation, is 60 to 65%. The left ventricle has normal function. Left ventricular endocardial border not optimally defined to evaluate regional wall motion. The left ventricular internal cavity size was normal in size. There is mild left ventricular hypertrophy. Left ventricular diastolic function could not be evaluated. Right Ventricle: The right ventricular size is not assessed. Not assessed. Right ventricular systolic function not assessed. Left Atrium: Left atrial size was not assessed. Right Atrium: Right atrial size was not assessed. Pericardium: Trivial pericardial effusion is present. Mitral Valve: The mitral valve is normal in structure. Trivial mitral valve regurgitation. Tricuspid Valve: The tricuspid valve is not assessed. Tricuspid valve regurgitation not assessed. Aortic Valve: Aortic valve is clearly stenotic, however no gradients could be obtained. The aortic valve was not assessed. Aortic valve regurgitation not assessed. Pulmonic Valve: The pulmonic valve was not assessed. Pulmonic valve regurgitation not assessed. Aorta: Aortic root could not be assessed. IAS/Shunts: The interatrial septum was not assessed.  LEFT VENTRICLE PLAX 2D LVIDd:         4.40 cm LVIDs:         3.30 cm LV PW:         1.10 cm LV IVS:        1.20 cm LVOT diam:     2.50 cm LVOT Area:     4.91 cm   SHUNTS Systemic Diam: 2.50 cm Epifanio Lesches MD Electronically signed by Epifanio Lesches MD Signature Date/Time: 12/10/2022/10:45:54 AM    Final    DG Abdomen 1 View  Result Date: 12/08/2022 CLINICAL DATA:  Nasogastric tube placement EXAM: ABDOMEN - 1 VIEW COMPARISON:  CT abdomen and pelvis 12/08/2022 FINDINGS: Nasogastric tube tip is in the proximal body of the stomach. Residual intravenous contrast identified in the  bilateral renal collecting systems. No dilated bowel loops  are seen likely secondary to fluid-filled bowel seen on CT. IMPRESSION: Nasogastric tube tip is in the proximal body of the stomach. Electronically Signed   By: Darliss Cheney M.D.   On: 12/08/2022 19:43   DG Abd Portable 1V  Result Date: 12/08/2022 CLINICAL DATA:  Nasogastric tube present. EXAM: PORTABLE ABDOMEN - 1 VIEW COMPARISON:  Radiograph earlier today. FINDINGS: Tip of the enteric tube is below the diaphragm in the stomach, the side port is just beyond the gastroesophageal junction. There is retained contrast in the right renal collecting system. Small bowel dilatation on CT is not demonstrated on the current exam. IMPRESSION: Tip of the enteric tube below the diaphragm in the stomach, the side port is just beyond the gastroesophageal junction. Electronically Signed   By: Narda Rutherford M.D.   On: 12/08/2022 19:42   CT ABDOMEN PELVIS W CONTRAST  Result Date: 12/08/2022 CLINICAL DATA:  Nausea and vomiting. EXAM: CT ABDOMEN AND PELVIS WITH CONTRAST TECHNIQUE: Multidetector CT imaging of the abdomen and pelvis was performed using the standard protocol following bolus administration of intravenous contrast. RADIATION DOSE REDUCTION: This exam was performed according to the departmental dose-optimization program which includes automated exposure control, adjustment of the mA and/or kV according to patient size and/or use of iterative reconstruction technique. CONTRAST:  OMNIPAQUE IOHEXOL 300 MG/ML  SOLN COMPARISON:  CT chest abdomen and pelvis 08/01/2022 FINDINGS: Lower chest: No acute abnormality. Hepatobiliary: No focal liver abnormality is seen. No gallstones, gallbladder wall thickening, or biliary dilatation. Pancreas: Diffusely atrophic, unchanged. There is a stable hypodensity in the neck of the pancreas measuring 7 mm. No pancreatic ductal dilatation or surrounding inflammation. Spleen: Normal in size without focal abnormality. Adrenals/Urinary Tract: Left adrenal gland not well seen.  Right adrenal gland is within normal limits. Left pelvic kidney is again noted. There is scarring in the left kidney, unchanged. There is no hydronephrosis or perinephric fat stranding. Bilateral peripelvic cysts are present. Bladder is within normal limits. Stomach/Bowel: There is a loop of small bowel which is fluid-filled and mildly dilated in a left inguinal hernia. This is transition point for high-grade small-bowel obstruction. Small-bowel proximal to this is dilated measuring up to 4.8 cm. The stomach is also dilated and fluid-filled. There is no pneumatosis or free air. Distal small bowel and colon are decompressed. The appendix is not seen. Vascular/Lymphatic: Aortic atherosclerosis. No enlarged abdominal or pelvic lymph nodes. Reproductive: Prostate calcifications are present. Other: There is a small amount of free fluid within a left inguinal hernia. There is also small amount of free fluid in the right upper quadrant. Musculoskeletal: No acute osseous findings. Degenerative changes affect the spine and hips. IMPRESSION: 1. High-grade small-bowel obstruction with transition point in a left inguinal hernia compatible with incarcerated/strangulated hernia. 2. Small amount of free fluid in the right upper quadrant and within the left inguinal hernia. 3. Stable 7 mm hypodensity in the neck of the this can be followed up with nonemergent MRI. 4. Small peripelvic cysts.  No follow-up imaging recommended. Aortic Atherosclerosis (ICD10-I70.0). Electronically Signed   By: Darliss Cheney M.D.   On: 12/08/2022 19:15   CT Head Wo Contrast  Result Date: 12/08/2022 CLINICAL DATA:  Weakness, ataxia, frequent falls EXAM: CT HEAD WITHOUT CONTRAST TECHNIQUE: Contiguous axial images were obtained from the base of the skull through the vertex without intravenous contrast. RADIATION DOSE REDUCTION: This exam was performed according to the departmental dose-optimization program which includes automated  exposure control,  adjustment of the mA and/or kV according to patient size and/or use of iterative reconstruction technique. COMPARISON:  12/04/2022 FINDINGS: Brain: No evidence of acute infarction, hemorrhage, mass, mass effect, or midline shift. No hydrocephalus. Redemonstrated prominent extra-axial CSF spaces. No new extra-axial collection. Vascular: No hyperdense vessel. Atherosclerotic calcifications in the intracranial carotid and vertebral arteries. Skull: Negative for fracture or focal lesion. Sinuses/Orbits: No acute finding. Other: The mastoid air cells are well aerated. IMPRESSION: No acute intracranial process. Electronically Signed   By: Wiliam Ke M.D.   On: 12/08/2022 19:04   DG Chest Port 1 View  Result Date: 12/08/2022 CLINICAL DATA:  Altered mental status. EXAM: PORTABLE CHEST 1 VIEW COMPARISON:  Chest radiographs 12/04/2022 and 11/02/2022 FINDINGS: Cardiac silhouette and mediastinal contours are within normal limits. Mild-to-moderate atherosclerotic calcifications within the aortic arch. Mild elevation of left hemidiaphragm, similar to prior. The lungs are clear. No pleural effusion or pneumothorax. Mild dextrocurvature of the midthoracic spine, similar to prior. IMPRESSION: No acute cardiopulmonary disease process. Electronically Signed   By: Neita Garnet M.D.   On: 12/08/2022 17:19   CT Cervical Spine Wo Contrast  Result Date: 12/04/2022 CLINICAL DATA:  82 year old male status post unwitnessed fall. Pain. EXAM: CT CERVICAL SPINE WITHOUT CONTRAST TECHNIQUE: Multidetector CT imaging of the cervical spine was performed without intravenous contrast. Multiplanar CT image reconstructions were also generated. RADIATION DOSE REDUCTION: This exam was performed according to the departmental dose-optimization program which includes automated exposure control, adjustment of the mA and/or kV according to patient size and/or use of iterative reconstruction technique. COMPARISON:  Cervical spine CT 11/02/2022.  FINDINGS: Alignment: Stable and relatively normal cervical lordosis. Cervicothoracic junction alignment is within normal limits. Bilateral posterior element alignment is within normal limits. Skull base and vertebrae: Visualized skull base is intact. No atlanto-occipital dissociation. C1 and C2 appear intact and aligned. No acute osseous abnormality identified. Soft tissues and spinal canal: No prevertebral fluid or swelling. No visible canal hematoma. Negative for age visible noncontrast neck soft tissues, calcified carotid atherosclerosis. Disc levels: Stable and fairly age-appropriate cervical spine degeneration. Upper chest: Stable subtle T2 superior endplate compression, and otherwise visible upper thoracic levels appear intact. Negative lung apices. IMPRESSION: 1. No acute traumatic injury identified in the cervical spine. 2. Age-appropriate cervical spine degeneration. Electronically Signed   By: Odessa Fleming M.D.   On: 12/04/2022 09:45   CT Head Wo Contrast  Result Date: 12/04/2022 CLINICAL DATA:  82 year old male status post unwitnessed fall. Pain. EXAM: CT HEAD WITHOUT CONTRAST TECHNIQUE: Contiguous axial images were obtained from the base of the skull through the vertex without intravenous contrast. RADIATION DOSE REDUCTION: This exam was performed according to the departmental dose-optimization program which includes automated exposure control, adjustment of the mA and/or kV according to patient size and/or use of iterative reconstruction technique. COMPARISON:  Head CT 11/02/2022. FINDINGS: Brain: Extra-axial CSF appears to be physiologic. Cerebral volume is within normal limits for age. No midline shift, ventriculomegaly, mass effect, evidence of mass lesion, intracranial hemorrhage or evidence of cortically based acute infarction. Gray-white differentiation within normal limits for age. Vascular: Calcified atherosclerosis at the skull base. No suspicious intracranial vascular hyperdensity. Skull:  Stable.  No fracture identified. Sinuses/Orbits: Visualized paranasal sinuses and mastoids are clear. Other: No orbit or scalp soft tissue injury identified. IMPRESSION: 1. No acute traumatic injury identified. 2. Stable and negative for age noncontrast CT appearance of the brain. Electronically Signed   By: Odessa Fleming M.D.   On: 12/04/2022  09:41   DG Chest 2 View  Result Date: 12/04/2022 CLINICAL DATA:  Larey Seat EXAM: CHEST - 2 VIEW COMPARISON:  11/02/2022 FINDINGS: Lungs are clear.  No pneumothorax. Heart size and mediastinal contours are within normal limits. Aortic Atherosclerosis (ICD10-170.0). No effusion. Visualized bones unremarkable. IMPRESSION: No acute cardiopulmonary disease. Electronically Signed   By: Corlis Leak M.D.   On: 12/04/2022 08:59   DG Shoulder Right  Result Date: 12/04/2022 CLINICAL DATA:  Larey Seat EXAM: RIGHT SHOULDER - 2+ VIEW COMPARISON:  None Available. FINDINGS: No fracture or dislocation.  Normal mineralization and alignment. IMPRESSION: Negative. Electronically Signed   By: Corlis Leak M.D.   On: 12/04/2022 08:58    Microbiology: Results for orders placed or performed during the hospital encounter of 08/01/22  Urine Culture     Status: Abnormal   Collection Time: 08/01/22 10:26 AM   Specimen: Urine, Clean Catch  Result Value Ref Range Status   Specimen Description   Final    URINE, CLEAN CATCH Performed at [facility], [address], [place], [place] [address]    Special Requests   Final    NONE Performed at [facility], [address], [place], [place] [address]    Culture 80,000 COLONIES/mL STAPHYLOCOCCUS EPIDERMIDIS (A)  Final   Report Status 08/03/2022 FINAL  Final   Organism ID, Bacteria STAPHYLOCOCCUS EPIDERMIDIS (A)  Final      Susceptibility   Staphylococcus epidermidis - MIC*    CIPROFLOXACIN <=0.5 SENSITIVE Sensitive     GENTAMICIN <=0.5 SENSITIVE Sensitive     NITROFURANTOIN <=16 SENSITIVE Sensitive     OXACILLIN  >=4 RESISTANT Resistant     TETRACYCLINE <=1 SENSITIVE Sensitive     VANCOMYCIN 1 SENSITIVE Sensitive     TRIMETH/SULFA 160 RESISTANT Resistant     CLINDAMYCIN <=0.25 SENSITIVE Sensitive     RIFAMPIN <=0.5 SENSITIVE Sensitive     Inducible Clindamycin NEGATIVE Sensitive     * 80,000 COLONIES/mL STAPHYLOCOCCUS EPIDERMIDIS    Labs: CBC: Recent Labs  Lab 12/08/22 1428 12/09/22 0425 12/12/22 0441  WBC 11.3* 6.9 5.6  NEUTROABS 9.9*  --   --   HGB 13.6 11.4* 13.4  HCT 42.0 36.3* 41.4  MCV 94.4 97.1 93.9  PLT 236 191 251   Basic Metabolic Panel: Recent Labs  Lab 12/08/22 1428 12/09/22 0425 12/10/22 0429 12/11/22 0458 12/12/22 0441  NA 144 143 139 138 144  K 4.3 3.3* 3.3* 3.3* 3.5  CL 108 112* 106 106 108  CO2 23 23 26 26 28   GLUCOSE 111* 85 92 114* 91  BUN 40* 32* 16 9 10   CREATININE 1.01 0.83 0.67 0.71 0.64  CALCIUM 9.5 8.4* 8.6* 8.8* 9.3  MG  --   --   --  2.0 2.1  PHOS  --   --   --  1.7* 2.3*   Liver Function Tests: Recent Labs  Lab 12/08/22 1428 12/11/22 0458 12/12/22 0441  AST 26  --  37  ALT 29  --  28  ALKPHOS 61  --  61  BILITOT 1.6*  --  0.8  PROT 7.3  --  6.4*  ALBUMIN 4.2 3.4* 3.5   CBG: Recent Labs  Lab 12/08/22 1422  GLUCAP 94    Discharge time spent: approximately 35 minutes spent on discharge counseling, evaluation of patient on day of discharge, and coordination of discharge planning with nursing, social work, pharmacy and case management  Signed: Alberteen Sam, MD [facility] 12/13/2022

## 2022-12-13 NOTE — Evaluation (Signed)
Clinical/Bedside Swallow Evaluation Patient Details  Name: Carlos [name] MRN: [id number] Date of Birth: 1940/10/15  Today's Date: 12/13/2022 Time: SLP Start Time (ACUTE ONLY): 1150 SLP Stop Time (ACUTE ONLY): 1205 SLP Time Calculation (min) (ACUTE ONLY): 15 min  Past Medical History:  Past Medical History:  Diagnosis Date   Amnesia 09/29/2021   Angina pectoris (HCC)    CAD (coronary artery disease) 05/14/2013   GERD (gastroesophageal reflux disease)    Hypercholesteremia    Hyperlipidemia 05/14/2013   NSTEMI (non-ST elevated myocardial infarction) (HCC) 08/01/2022   Osteoarthritis of knee 09/29/2021   Other long term (current) drug therapy 09/29/2021   Poor balance 09/29/2021   Screening for malignant neoplasm of prostate 09/29/2021   ST elevation myocardial infarction (STEMI) of lateral wall, initial episode of care (HCC) 05/12/2013   Past Surgical History:  Past Surgical History:  Procedure Laterality Date   ABDOMINAL SURGERY     CARDIAC CATHETERIZATION     CORONARY ANGIOPLASTY     LEFT HEART CATHETERIZATION WITH CORONARY ANGIOGRAM N/A 05/12/2013   Procedure: LEFT HEART CATHETERIZATION WITH CORONARY ANGIOGRAM;  Surgeon: Micheline Chapman, MD;  Location: [facility] CATH LAB;  Service: Cardiovascular;  Laterality: N/A;   PERCUTANEOUS STENT INTERVENTION N/A 05/12/2013   Procedure: PERCUTANEOUS STENT INTERVENTION;  Surgeon: Micheline Chapman, MD;  Location: [facility] CATH LAB;  Service: Cardiovascular;  Laterality: N/A;   HPI:  Patient is an 82 y.o. male with PMH: advanced dementia (lives in memory care), CAD, GERD, HLD. He presented to the hospital on 12/08/2022 with nausea and feculent vomit and found to have incarcerated left inguinal hernia causing SBO. He was followed by surgery and NG was placed, hernia was able to be reduced by surgery on 10/16. Cardiology consulted for risk assessment per surgery request and patient deemed to be high risk. Surgery advanced patient's diet to clear liquids on  10/18 and then to full liquids on 10/21. SLP swallow evaluation ordered on 10/21 to assess swallow function and aspiration risk as patient with observed coughing with pudding consistency.    Assessment / Plan / Recommendation  Clinical Impression  Patient seen by SLP for bedside swallow evaluation. He was awake and requesting his lunch when SLP entered the room. He appeared distractible and required cueing throughout the session. SLP initiated bites of thin liquid (potato soup) and pudding consistencies. Patient presented with no overt s/sx of aspiration at the time of this evaluation. Poor attendance to POs and internal distractions resulted in patient speaking before he had swallowed. Suspicion of delay in swallow initiation. Patient is safe to advance to dys 1 (puree) diet at this time. Speech will continue to follow up through this hospitalization. SLP Visit Diagnosis: Dysphagia, unspecified (R13.10)    Aspiration Risk  Mild aspiration risk    Diet Recommendation Dysphagia 1 (Puree);Thin liquid    Liquid Administration via: Spoon;Cup;Straw Medication Administration: Crushed with puree Supervision: Staff to assist with self feeding;Full supervision/cueing for compensatory strategies Compensations: Minimize environmental distractions;Small sips/bites;Slow rate Postural Changes: Seated upright at 90 degrees    Other  Recommendations Oral Care Recommendations: Oral care BID;Staff/trained caregiver to provide oral care    Recommendations for follow up therapy are one component of a multi-disciplinary discharge planning process, led by the attending physician.  Recommendations may be updated based on patient status, additional functional criteria and insurance authorization.  Follow up Recommendations Skilled nursing-short term rehab (<3 hours/day)      Assistance Recommended at Discharge    Functional Status Assessment Patient has  had a recent decline in their functional status and  demonstrates the ability to make significant improvements in function in a reasonable and predictable amount of time.  Frequency and Duration min 2x/week  2 weeks       Prognosis Prognosis for improved oropharyngeal function: Fair Barriers to Reach Goals: Cognitive deficits      Swallow Study   General Date of Onset: 12/13/22 HPI: Patient is an 82 y.o. male with PMH: advanced dementia (lives in memory care), CAD, GERD, HLD. He presented to the hospital on 12/08/2022 with nausea and feculent vomit and found to have incarcerated left inguinal hernia causing SBO. He was followed by surgery and NG was placed, hernia was able to be reduced by surgery on 10/16. Cardiology consulted for risk assessment per surgery request and patient deemed to be high risk. Surgery advanced patient's diet to clear liquids on 10/18 and then to full liquids on 10/21. SLP swallow evaluation ordered on 10/21 to assess swallow function and aspiration risk as patient with observed coughing with pudding consistency. Type of Study: Bedside Swallow Evaluation Previous Swallow Assessment: during previous admission in June of 2024 Diet Prior to this Study: Full liquid diet;Thin liquids (Level 0) Temperature Spikes Noted: No Respiratory Status: Room air History of Recent Intubation: No Behavior/Cognition: Alert;Cooperative;Confused;Distractible;Requires cueing Oral Cavity Assessment: Within Functional Limits Oral Care Completed by SLP: No Oral Cavity - Dentition: Adequate natural dentition Self-Feeding Abilities: Total assist Patient Positioning: Upright in bed Baseline Vocal Quality: Normal Volitional Cough: Cognitively unable to elicit Volitional Swallow: Unable to elicit    Oral/Motor/Sensory Function Overall Oral Motor/Sensory Function: Within functional limits   Ice Chips Ice chips: Not tested   Thin Liquid Thin Liquid: Impaired Presentation: Spoon Pharyngeal  Phase Impairments: Suspected delayed Swallow     Nectar Thick Nectar Thick Liquid: Not tested   Honey Thick Honey Thick Liquid: Not tested   Puree Puree: Within functional limits Presentation: Spoon   Solid     Solid: Not tested      Marline Backbone, B.S., Speech Therapy Student   12/13/2022,12:47 PM

## 2022-12-14 ENCOUNTER — Encounter (HOSPITAL_COMMUNITY): Payer: Self-pay | Admitting: Emergency Medicine

## 2022-12-14 ENCOUNTER — Emergency Department (HOSPITAL_COMMUNITY): Payer: Medicare Other

## 2022-12-14 ENCOUNTER — Emergency Department (HOSPITAL_COMMUNITY)
Admission: EM | Admit: 2022-12-14 | Discharge: 2022-12-15 | Disposition: A | Discharge status: Skilled Nursing Facility | Payer: Medicare Other | Attending: Emergency Medicine | Admitting: Emergency Medicine

## 2022-12-14 ENCOUNTER — Other Ambulatory Visit: Payer: Self-pay

## 2022-12-14 DIAGNOSIS — S0990XA Unspecified injury of head, initial encounter: Secondary | ICD-10-CM | POA: Diagnosis not present

## 2022-12-14 DIAGNOSIS — M47816 Spondylosis without myelopathy or radiculopathy, lumbar region: Secondary | ICD-10-CM | POA: Diagnosis not present

## 2022-12-14 DIAGNOSIS — R5383 Other fatigue: Secondary | ICD-10-CM | POA: Insufficient documentation

## 2022-12-14 DIAGNOSIS — I2510 Atherosclerotic heart disease of native coronary artery without angina pectoris: Secondary | ICD-10-CM | POA: Insufficient documentation

## 2022-12-14 DIAGNOSIS — R9431 Abnormal electrocardiogram [ECG] [EKG]: Secondary | ICD-10-CM | POA: Diagnosis not present

## 2022-12-14 DIAGNOSIS — R918 Other nonspecific abnormal finding of lung field: Secondary | ICD-10-CM | POA: Diagnosis not present

## 2022-12-14 DIAGNOSIS — F0390 Unspecified dementia without behavioral disturbance: Secondary | ICD-10-CM | POA: Diagnosis not present

## 2022-12-14 DIAGNOSIS — Y92002 Bathroom of unspecified non-institutional (private) residence single-family (private) house as the place of occurrence of the external cause: Secondary | ICD-10-CM | POA: Insufficient documentation

## 2022-12-14 DIAGNOSIS — Z79899 Other long term (current) drug therapy: Secondary | ICD-10-CM | POA: Insufficient documentation

## 2022-12-14 DIAGNOSIS — Z043 Encounter for examination and observation following other accident: Secondary | ICD-10-CM | POA: Diagnosis not present

## 2022-12-14 DIAGNOSIS — I10 Essential (primary) hypertension: Secondary | ICD-10-CM | POA: Diagnosis not present

## 2022-12-14 DIAGNOSIS — S299XXA Unspecified injury of thorax, initial encounter: Secondary | ICD-10-CM | POA: Diagnosis not present

## 2022-12-14 DIAGNOSIS — M160 Bilateral primary osteoarthritis of hip: Secondary | ICD-10-CM | POA: Diagnosis not present

## 2022-12-14 DIAGNOSIS — W19XXXA Unspecified fall, initial encounter: Secondary | ICD-10-CM

## 2022-12-14 DIAGNOSIS — R0902 Hypoxemia: Secondary | ICD-10-CM | POA: Diagnosis not present

## 2022-12-14 DIAGNOSIS — I672 Cerebral atherosclerosis: Secondary | ICD-10-CM | POA: Diagnosis not present

## 2022-12-14 DIAGNOSIS — I878 Other specified disorders of veins: Secondary | ICD-10-CM | POA: Diagnosis not present

## 2022-12-14 DIAGNOSIS — Z7902 Long term (current) use of antithrombotics/antiplatelets: Secondary | ICD-10-CM | POA: Insufficient documentation

## 2022-12-14 DIAGNOSIS — I6523 Occlusion and stenosis of bilateral carotid arteries: Secondary | ICD-10-CM | POA: Diagnosis not present

## 2022-12-14 DIAGNOSIS — S199XXA Unspecified injury of neck, initial encounter: Secondary | ICD-10-CM | POA: Diagnosis not present

## 2022-12-14 HISTORY — DX: Unspecified dementia, unspecified severity, without behavioral disturbance, psychotic disturbance, mood disturbance, and anxiety: F03.90

## 2022-12-14 LAB — COMPREHENSIVE METABOLIC PANEL
ALT: 30 U/L (ref 0–44)
AST: 28 U/L (ref 15–41)
Albumin: 3 g/dL — ABNORMAL LOW (ref 3.5–5.0)
Alkaline Phosphatase: 57 U/L (ref 38–126)
Anion gap: 6 (ref 5–15)
BUN: 18 mg/dL (ref 8–23)
CO2: 28 mmol/L (ref 22–32)
Calcium: 8.9 mg/dL (ref 8.9–10.3)
Chloride: 107 mmol/L (ref 98–111)
Creatinine, Ser: 0.9 mg/dL (ref 0.61–1.24)
GFR, Estimated: >60 mL/min (ref >60)
Glucose, Bld: 99 mg/dL (ref 70–99)
Potassium: 3.7 mmol/L (ref 3.5–5.1)
Sodium: 141 mmol/L (ref 135–145)
Total Bilirubin: 0.6 mg/dL (ref 0.3–1.2)
Total Protein: 5.5 g/dL — ABNORMAL LOW (ref 6.5–8.1)

## 2022-12-14 LAB — CBC WITH DIFFERENTIAL/PLATELET
Abs Immature Granulocytes: 0.03 10*3/uL (ref 0.00–0.07)
Basophils Absolute: 0 10*3/uL (ref 0.0–0.1)
Basophils Relative: 1 %
Eosinophils Absolute: 0.1 10*3/uL (ref 0.0–0.5)
Eosinophils Relative: 2 %
HCT: 35.2 % — ABNORMAL LOW (ref 39.0–52.0)
Hemoglobin: 11.5 g/dL — ABNORMAL LOW (ref 13.0–17.0)
Immature Granulocytes: 1 %
Lymphocytes Relative: 18 %
Lymphs Abs: 1.1 10*3/uL (ref 0.7–4.0)
MCH: 30.3 pg (ref 26.0–34.0)
MCHC: 32.7 g/dL (ref 30.0–36.0)
MCV: 92.6 fL (ref 80.0–100.0)
Monocytes Absolute: 0.7 10*3/uL (ref 0.1–1.0)
Monocytes Relative: 11 %
Neutro Abs: 4.2 10*3/uL (ref 1.7–7.7)
Neutrophils Relative %: 67 %
Platelets: 273 10*3/uL (ref 150–400)
RBC: 3.8 MIL/uL — ABNORMAL LOW (ref 4.22–5.81)
RDW: 13.5 % (ref 11.5–15.5)
WBC: 6.1 10*3/uL (ref 4.0–10.5)
nRBC: 0 % (ref 0.0–0.2)

## 2022-12-14 NOTE — ED Notes (Signed)
Xray tech at bedside at this time

## 2022-12-14 NOTE — ED Notes (Signed)
Pt returned back to room from CT

## 2022-12-14 NOTE — ED Notes (Signed)
Patient transported to CT 

## 2022-12-14 NOTE — ED Provider Notes (Signed)
[place] EMERGENCY DEPARTMENT AT [facility] Provider Note   CSN: [id number] Arrival date & time: 12/14/22  2236     History {Add pertinent medical, surgical, social history, OB history to HPI:1} Chief Complaint  Patient presents with  . Trauma    Level 2 trauma. Arrived via EMS from nursing home. Unwitnessed fall. Staff found him on the floor in the bathroom. EMS unable to tell what is pt's baseline, but apparently pt was d/c from the hospital yesterday and staff reports he is "weaker". EMS reports pt takes plavix. Pt lethargic during triage.    Carlos [name] is a 82 y.o. male.  Pt is a 82 yo male with pmhx significant for gerd, hld, cad, and dementia.  Pt was admitted from 10/16-10/21 for sbo due to an incarcerated hernia.  Pt was seen by surgery, but they recommended against surgery.       Home Medications Prior to Admission medications   Medication Sig Start Date End Date Taking? Authorizing Provider  b complex vitamins tablet Take 1 tablet by mouth every Monday.    [provider]  Calcium Carbonate (CALCARB 600 PO) Take 1,200 mg by mouth every evening. 10/06/22 01/04/23  [provider]  calcium carbonate (TUMS EX) 750 MG chewable tablet Chew 1 tablet by mouth every 8 (eight) hours as needed for heartburn.    [provider]  clopidogrel (PLAVIX) 75 MG tablet Take 1 tablet (75 mg total) by mouth daily. 09/30/21   Baldo Daub, MD  docusate sodium (COLACE) 100 MG capsule Take 1 capsule (100 mg total) by mouth 2 (two) times daily. Patient not taking: Reported on 12/08/2022 08/06/22   Lorin Glass, MD  Glucosamine HCl (GLUCOSAMINE PO) Take 2 tablets by mouth 2 (two) times daily.    [provider]  hydrOXYzine (VISTARIL) 25 MG capsule Take 25 mg by mouth every 12 (twelve) hours as needed (for agitation). 12/08/22 01/07/23  [provider]  loperamide (IMODIUM A-D) 2 MG tablet Take 2 mg by mouth every 6 (six) hours as  needed for diarrhea or loose stools.    [provider]  metoprolol tartrate (LOPRESSOR) 25 MG tablet Take 0.5 tablets (12.5 mg total) by mouth 2 (two) times daily. 12/13/22   Danford, Earl Lites, MD  Multiple Vitamin (MULTIVITAMIN) tablet Take 1 tablet by mouth daily with breakfast.    [provider]  nitroGLYCERIN (NITROSTAT) 0.4 MG SL tablet Place 1 tablet (0.4 mg total) under the tongue every 5 (five) minutes x 3 doses as needed for chest pain. Patient taking differently: Place 0.4 mg under the tongue every 5 (five) minutes x 3 doses as needed for chest pain (CALL MD, IF NO RELIEF). 09/30/21   Baldo Daub, MD  Omega-3 Fatty Acids (FISH OIL OMEGA-3) 1000 MG CAPS Take 1,000 mg by mouth daily.    [provider]  ondansetron (ZOFRAN) 4 MG tablet Take 4 mg by mouth every 12 (twelve) hours as needed for nausea. 12/07/22 01/06/23  [provider]  polyethylene glycol (MIRALAX / GLYCOLAX) 17 g packet Take 17 g by mouth daily as needed for mild constipation. Patient taking differently: Take 17 g by mouth daily as needed for mild constipation (to be mixed into 4 ounces of water). 08/06/22   Lorin Glass, MD  QUEtiapine (SEROQUEL) 25 MG tablet Take 1 tablet (25 mg total) by mouth 2 (two) times daily. Patient taking differently: Take 25 mg by mouth at bedtime. 08/06/22   Dahal,  Melina Schools, MD  simvastatin (ZOCOR) 40 MG tablet Take 40 mg by mouth every evening.    [provider]  vitamin E 180 MG (400 UNITS) capsule Take 400 Units by mouth daily.    [provider]      Allergies    Patient has no known allergies.    Review of Systems   Review of Systems  Physical Exam Updated Vital Signs BP (S) 108/76 (BP Location: Left Arm)   Pulse 74   Temp 98.4 F (36.9 C) (Oral)   Resp 16   SpO2 99%  Physical Exam  ED Results / Procedures / Treatments   Labs (all labs ordered are listed, but only abnormal results are displayed) Labs Reviewed  CBC  WITH DIFFERENTIAL/PLATELET - Abnormal; Notable for the following components:      Result Value   RBC 3.80 (*)    Hemoglobin 11.5 (*)    HCT 35.2 (*)    All other components within normal limits  COMPREHENSIVE METABOLIC PANEL - Abnormal; Notable for the following components:   Total Protein 5.5 (*)    Albumin 3.0 (*)    All other components within normal limits  URINALYSIS, ROUTINE W REFLEX MICROSCOPIC    EKG None  Radiology DG CHEST PORT 1 VIEW  Result Date: 12/13/2022 CLINICAL DATA:  "Aspiration into airway" EXAM: PORTABLE CHEST 1 VIEW COMPARISON:  12/08/2022 FINDINGS: 2 frontal views of the chest with the patient oblique to the left. Normal heart size. No pleural effusion or pneumothorax. Given obliquity, the lungs are clear. IMPRESSION: No acute cardiopulmonary disease. Mild position degradation. Electronically Signed   By: Jeronimo Greaves M.D.   On: 12/13/2022 16:31    Procedures Procedures  {Document cardiac monitor, telemetry assessment procedure when appropriate:1}  Medications Ordered in ED Medications - No data to display  ED Course/ Medical Decision Making/ A&P   {   Click here for ABCD2, HEART and other calculatorsREFRESH Note before signing :1}                              Medical Decision Making Amount and/or Complexity of Data Reviewed Labs: ordered. Radiology: ordered.   ***  {Document critical care time when appropriate:1} {Document review of labs and clinical decision tools ie heart score, Chads2Vasc2 etc:1}  {Document your independent review of radiology images, and any outside records:1} {Document your discussion with family members, caretakers, and with consultants:1} {Document social determinants of health affecting pt's care:1} {Document your decision making why or why not admission, treatments were needed:1} Final Clinical Impression(s) / ED Diagnoses Final diagnoses:  None    Rx / DC Orders ED Discharge Orders     None

## 2022-12-14 NOTE — ED Notes (Signed)
Trauma Response Nurse Documentation   Carlos [name] is a 82 y.o. male arriving to [facility] ED via [facility]  On clopidogrel 75 mg daily. Trauma was activated as a Level 2 by Eston Esters based on the following trauma criteria Elderly patients > 65 with head trauma on anti-coagulation (excluding ASA).  Patient cleared for CT by Dr. Particia Nearing. Pt transported to CT with trauma response nurse present to monitor. RN remained with the patient throughout their absence from the department for clinical observation.   GCS 14.  History   Past Medical History:  Diagnosis Date   Amnesia 09/29/2021   Angina pectoris (HCC)    CAD (coronary artery disease) 05/14/2013   Dementia (HCC)    GERD (gastroesophageal reflux disease)    Hypercholesteremia    Hyperlipidemia 05/14/2013   NSTEMI (non-ST elevated myocardial infarction) (HCC) 08/01/2022   Osteoarthritis of knee 09/29/2021   Other long term (current) drug therapy 09/29/2021   Poor balance 09/29/2021   Screening for malignant neoplasm of prostate 09/29/2021   ST elevation myocardial infarction (STEMI) of lateral wall, initial episode of care (HCC) 05/12/2013     Past Surgical History:  Procedure Laterality Date   ABDOMINAL SURGERY     CARDIAC CATHETERIZATION     CORONARY ANGIOPLASTY     LEFT HEART CATHETERIZATION WITH CORONARY ANGIOGRAM N/A 05/12/2013   Procedure: LEFT HEART CATHETERIZATION WITH CORONARY ANGIOGRAM;  Surgeon: Micheline Chapman, MD;  Location: [facility] CATH LAB;  Service: Cardiovascular;  Laterality: N/A;   PERCUTANEOUS STENT INTERVENTION N/A 05/12/2013   Procedure: PERCUTANEOUS STENT INTERVENTION;  Surgeon: Micheline Chapman, MD;  Location: [facility] CATH LAB;  Service: Cardiovascular;  Laterality: N/A;       Initial Focused Assessment (If applicable, or please see trauma documentation): Airway-- intact, no visible obstruction Breathing-- spontaneous, unlabored Circulation-- no obvious bleeding noted  CT's Completed:    CT Head and CT C-Spine   Interventions:  See event summary  Plan for disposition:  {Trauma Dispo:26867}   Consults completed:  {Trauma Consults:26862} at ***.  Event Summary: Patient brought in by [facility] from SNF. Patient found down by staff, assumed fall. Patient lethargic with EMS, staff reports same since recent discharge from hospital. Manual BP obtained. Lab work obtained. Xray chest and pelvis completed. Patient to CT with TRN. CT head and c-spine completed.  MTP Summary (If applicable):  N/A  Bedside handoff with ED RN [place].    Leota Sauers  Trauma Response RN  Please call TRN at [phone] for further assistance.

## 2022-12-15 DIAGNOSIS — R001 Bradycardia, unspecified: Secondary | ICD-10-CM | POA: Diagnosis not present

## 2022-12-15 DIAGNOSIS — R4182 Altered mental status, unspecified: Secondary | ICD-10-CM | POA: Diagnosis not present

## 2022-12-15 DIAGNOSIS — Z7401 Bed confinement status: Secondary | ICD-10-CM | POA: Diagnosis not present

## 2022-12-15 LAB — URINALYSIS, ROUTINE W REFLEX MICROSCOPIC
Bilirubin Urine: NEGATIVE
Glucose, UA: NEGATIVE mg/dL
Hgb urine dipstick: NEGATIVE
Ketones, ur: NEGATIVE mg/dL
Nitrite: NEGATIVE
Protein, ur: NEGATIVE mg/dL
Specific Gravity, Urine: 1.017 (ref 1.005–1.030)
pH: 7 (ref 5.0–8.0)

## 2022-12-15 LAB — CK: Total CK: 88 U/L (ref 49–397)

## 2022-12-15 NOTE — ED Notes (Signed)
Pt awake, confused, wanting a breakfast buffet, pt reoriented to place, time and situation, pt began cussing and yelling saying that he wants "off this rack", pt stating that he has had nothing to eat all day long, pt offered snacks until able to get food, pt just wants food.

## 2022-12-15 NOTE — ED Notes (Signed)
Called lab to add on CK

## 2022-12-15 NOTE — ED Provider Notes (Incomplete)
[name] Provider Note   CSN: [id number] Arrival date & time: 12/14/22  2236     History {Add pertinent medical, surgical, social history, OB history to HPI:1} Chief Complaint  Patient presents with  . Trauma    Level 2 trauma. Arrived via EMS from nursing home. Unwitnessed fall. Staff found him on the floor in the bathroom. EMS unable to tell what is pt's baseline, but apparently pt was d/c from the hospital yesterday and staff reports he is "weaker". EMS reports pt takes plavix. Pt lethargic during triage.    Carlos [name] is a 82 y.o. male.  Pt is a 82 yo male with pmhx significant for gerd, hld, cad, and dementia.  Pt was admitted from 10/16-10/21 for sbo due to an incarcerated hernia.  Pt was seen by surgery, but they recommended against surgery.  He is supposed to be wearing a truss belt.  Pt was found on the ground in his room.  It was not witnessed.  Pt is unable to give any hx.          Home Medications Prior to Admission medications   Medication Sig Start Date End Date Taking? Authorizing Provider  b complex vitamins tablet Take 1 tablet by mouth every Monday.    [provider]  Calcium Carbonate (CALCARB 600 PO) Take 1,200 mg by mouth every evening. 10/06/22 01/04/23  [provider]  calcium carbonate (TUMS EX) 750 MG chewable tablet Chew 1 tablet by mouth every 8 (eight) hours as needed for heartburn.    [provider]  clopidogrel (PLAVIX) 75 MG tablet Take 1 tablet (75 mg total) by mouth daily. 09/30/21   Baldo Daub, MD  docusate sodium (COLACE) 100 MG capsule Take 1 capsule (100 mg total) by mouth 2 (two) times daily. Patient not taking: Reported on 12/08/2022 08/06/22   Lorin Glass, MD  Glucosamine HCl (GLUCOSAMINE PO) Take 2 tablets by mouth 2 (two) times daily.    [provider]  hydrOXYzine (VISTARIL) 25 MG capsule Take 25 mg by mouth every 12 (twelve) hours as needed  (for agitation). 12/08/22 01/07/23  [provider]  loperamide (IMODIUM A-D) 2 MG tablet Take 2 mg by mouth every 6 (six) hours as needed for diarrhea or loose stools.    [provider]  metoprolol tartrate (LOPRESSOR) 25 MG tablet Take 0.5 tablets (12.5 mg total) by mouth 2 (two) times daily. 12/13/22   Danford, Earl Lites, MD  Multiple Vitamin (MULTIVITAMIN) tablet Take 1 tablet by mouth daily with breakfast.    [provider]  nitroGLYCERIN (NITROSTAT) 0.4 MG SL tablet Place 1 tablet (0.4 mg total) under the tongue every 5 (five) minutes x 3 doses as needed for chest pain. Patient taking differently: Place 0.4 mg under the tongue every 5 (five) minutes x 3 doses as needed for chest pain (CALL MD, IF NO RELIEF). 09/30/21   Baldo Daub, MD  Omega-3 Fatty Acids (FISH OIL OMEGA-3) 1000 MG CAPS Take 1,000 mg by mouth daily.    [provider]  ondansetron (ZOFRAN) 4 MG tablet Take 4 mg by mouth every 12 (twelve) hours as needed for nausea. 12/07/22 01/06/23  [provider]  polyethylene glycol (MIRALAX / GLYCOLAX) 17 g packet Take 17 g by mouth daily as needed for mild constipation. Patient taking differently: Take 17 g by mouth daily as needed for mild constipation (to be mixed into 4 ounces of water). 08/06/22  Lorin Glass, MD  QUEtiapine (SEROQUEL) 25 MG tablet Take 1 tablet (25 mg total) by mouth 2 (two) times daily. Patient taking differently: Take 25 mg by mouth at bedtime. 08/06/22   Lorin Glass, MD  simvastatin (ZOCOR) 40 MG tablet Take 40 mg by mouth every evening.    [provider]  vitamin E 180 MG (400 UNITS) capsule Take 400 Units by mouth daily.    [provider]      Allergies    Patient has no known allergies.    Review of Systems   Review of Systems  Unable to perform ROS: Dementia  All other systems reviewed and are negative.   Physical Exam Updated Vital Signs BP (S) 108/76 (BP Location: Left Arm)    Pulse 74   Temp 98.4 F (36.9 C) (Oral)   Resp 16   SpO2 99%  Physical Exam Vitals and nursing note reviewed.  Constitutional:      Appearance: Normal appearance.  HENT:     Head: Normocephalic and atraumatic.     Right Ear: External ear normal.     Left Ear: External ear normal.     Nose: Nose normal.     Mouth/Throat:     Mouth: Mucous membranes are moist.     Pharynx: Oropharynx is clear.  Eyes:     Extraocular Movements: Extraocular movements intact.     Conjunctiva/sclera: Conjunctivae normal.     Pupils: Pupils are equal, round, and reactive to light.  Neck:     Comments: In c-collar Cardiovascular:     Rate and Rhythm: Normal rate and regular rhythm.     Pulses: Normal pulses.     Heart sounds: Normal heart sounds.  Pulmonary:     Effort: Pulmonary effort is normal.     Breath sounds: Normal breath sounds.  Abdominal:     General: Abdomen is flat. Bowel sounds are normal.     Palpations: Abdomen is soft.  Musculoskeletal:        General: Normal range of motion.  Skin:    General: Skin is warm.     Capillary Refill: Capillary refill takes less than 2 seconds.  Neurological:     General: No focal deficit present.     Mental Status: He is alert and oriented to person, place, and time.  Psychiatric:        Mood and Affect: Mood normal.        Behavior: Behavior normal.     ED Results / Procedures / Treatments   Labs (all labs ordered are listed, but only abnormal results are displayed) Labs Reviewed  CBC WITH DIFFERENTIAL/PLATELET - Abnormal; Notable for the following components:      Result Value   RBC 3.80 (*)    Hemoglobin 11.5 (*)    HCT 35.2 (*)    All other components within normal limits  COMPREHENSIVE METABOLIC PANEL - Abnormal; Notable for the following components:   Total Protein 5.5 (*)    Albumin 3.0 (*)    All other components within normal limits  URINALYSIS, ROUTINE W REFLEX MICROSCOPIC    EKG None  Radiology DG CHEST PORT 1  VIEW  Result Date: 12/13/2022 CLINICAL DATA:  "Aspiration into airway" EXAM: PORTABLE CHEST 1 VIEW COMPARISON:  12/08/2022 FINDINGS: 2 frontal views of the chest with the patient oblique to the left. Normal heart size. No pleural effusion or pneumothorax. Given obliquity, the lungs are clear. IMPRESSION: No acute cardiopulmonary disease. Mild position degradation. Electronically Signed  By: Jeronimo Greaves M.D.   On: 12/13/2022 16:31    Procedures Procedures  {Document cardiac monitor, telemetry assessment procedure when appropriate:1}  Medications Ordered in ED Medications - No data to display  ED Course/ Medical Decision Making/ A&P   {   Click here for ABCD2, HEART and other calculatorsREFRESH Note before signing :1}                              Medical Decision Making Amount and/or Complexity of Data Reviewed Labs: ordered. Radiology: ordered.   This patient presents to the ED for concern of fall, this involves an extensive number of treatment options, and is a complaint that carries with it a high risk of complications and morbidity.  The differential diagnosis includes multiple trauma   Co morbidities that complicate the patient evaluation  erd, hld, cad, and dementia   Additional history obtained:  Additional history obtained from epic chart review External records from outside source obtained and reviewed including EMS report    Lab Tests:  I Ordered, and personally interpreted labs.  The pertinent results include:  cbc 11.5 (chronic), cmp nl   Imaging Studies ordered:  I ordered imaging studies including ct head/c-spine  I independently visualized and interpreted imaging which showed *** I agree with the radiologist interpretation   Cardiac Monitoring:  The patient was maintained on a cardiac monitor.  I personally viewed and interpreted the cardiac monitored which showed an underlying rhythm of: ***   Medicines ordered and prescription drug  management:  I ordered medication including ***  for ***  Reevaluation of the patient after these medicines showed that the patient {resolved/improved/worsened:23923::"improved"} I have reviewed the patients home medicines and have made adjustments as needed   Test Considered:  ***   Critical Interventions:  ***   Consultations Obtained:  I requested consultation with the ***,  and discussed lab and imaging findings as well as pertinent plan - they recommend: ***   Problem List / ED Course:  ***   Reevaluation:  After the interventions noted above, I reevaluated the patient and found that they have :{resolved/improved/worsened:23923::"improved"}   Social Determinants of Health:  ***   Dispostion:  After consideration of the diagnostic results and the patients response to treatment, I feel that the patent would benefit from ***.    {Document critical care time when appropriate:1} {Document review of labs and clinical decision tools ie heart score, Chads2Vasc2 etc:1}  {Document your independent review of radiology images, and any outside records:1} {Document your discussion with family members, caretakers, and with consultants:1} {Document social determinants of health affecting pt's care:1} {Document your decision making why or why not admission, treatments were needed:1} Final Clinical Impression(s) / ED Diagnoses Final diagnoses:  None    Rx / DC Orders ED Discharge Orders     None

## 2022-12-15 NOTE — ED Provider Notes (Signed)
Patient care assumed at 2330.  Pt here for evaluation following an unwitnessed fall.  Care assumed pending imaging.   Imaging is negative for acute abnormality.  Labs are near his baseline.  UA is not consistent with UTI.  CK is within normal limits, no evidence of rhabdomyolysis.  Patient evaluated the bedside, has no complaints.  He does have dementia.  He is able to stand without complaints.  Per facility he has not been walking since hospital discharge.  Abdominal examination soft and nontender, no evidence of incarcerated hernia.  Plan to discharge back to facility with outpatient follow-up.  Left a voicemail for patient's niece and power of attorney.   Tilden Fossa, MD 12/15/22 [phone]

## 2022-12-15 NOTE — ED Notes (Signed)
Pt is sleeping, NAD noted, respirations even and unlabored, skin warm and dry, waiting for transport, will continue to monitor.

## 2022-12-15 NOTE — ED Notes (Signed)
Called nursing home x3 back to back to give report and update them on pt's d/c. No answer x3.

## 2022-12-15 NOTE — ED Notes (Signed)
Cleared from c-collar by Dr. Madilyn Hook.

## 2022-12-15 NOTE — ED Notes (Signed)
Pt able to stand and pivot. Non ambulatory. Spoke with Nurse Claris Che at [facility] at [phone] and she informed me that pt has been requiring assist x2. Has not been able to ambulate and has been utilizing a w/c since he's been at their facility.

## 2022-12-15 NOTE — ED Notes (Signed)
[facility] Called for transport 

## 2022-12-22 DIAGNOSIS — I252 Old myocardial infarction: Secondary | ICD-10-CM | POA: Diagnosis not present

## 2022-12-22 DIAGNOSIS — S42024D Nondisplaced fracture of shaft of right clavicle, subsequent encounter for fracture with routine healing: Secondary | ICD-10-CM | POA: Diagnosis not present

## 2022-12-22 DIAGNOSIS — F0390 Unspecified dementia without behavioral disturbance: Secondary | ICD-10-CM | POA: Diagnosis not present

## 2022-12-22 DIAGNOSIS — M1990 Unspecified osteoarthritis, unspecified site: Secondary | ICD-10-CM | POA: Diagnosis not present

## 2022-12-22 DIAGNOSIS — T796XXD Traumatic ischemia of muscle, subsequent encounter: Secondary | ICD-10-CM | POA: Diagnosis not present

## 2022-12-22 DIAGNOSIS — E785 Hyperlipidemia, unspecified: Secondary | ICD-10-CM | POA: Diagnosis not present

## 2022-12-22 DIAGNOSIS — Z87440 Personal history of urinary (tract) infections: Secondary | ICD-10-CM | POA: Diagnosis not present

## 2022-12-22 DIAGNOSIS — R1310 Dysphagia, unspecified: Secondary | ICD-10-CM | POA: Diagnosis not present

## 2022-12-22 DIAGNOSIS — Z556 Problems related to health literacy: Secondary | ICD-10-CM | POA: Diagnosis not present

## 2022-12-22 DIAGNOSIS — Z7902 Long term (current) use of antithrombotics/antiplatelets: Secondary | ICD-10-CM | POA: Diagnosis not present

## 2022-12-22 DIAGNOSIS — K219 Gastro-esophageal reflux disease without esophagitis: Secondary | ICD-10-CM | POA: Diagnosis not present

## 2022-12-22 DIAGNOSIS — Z9181 History of falling: Secondary | ICD-10-CM | POA: Diagnosis not present

## 2022-12-22 DIAGNOSIS — I10 Essential (primary) hypertension: Secondary | ICD-10-CM | POA: Diagnosis not present

## 2022-12-22 DIAGNOSIS — I2510 Atherosclerotic heart disease of native coronary artery without angina pectoris: Secondary | ICD-10-CM | POA: Diagnosis not present

## 2022-12-23 DIAGNOSIS — Z87440 Personal history of urinary (tract) infections: Secondary | ICD-10-CM | POA: Diagnosis not present

## 2022-12-23 DIAGNOSIS — I2510 Atherosclerotic heart disease of native coronary artery without angina pectoris: Secondary | ICD-10-CM | POA: Diagnosis not present

## 2022-12-23 DIAGNOSIS — E785 Hyperlipidemia, unspecified: Secondary | ICD-10-CM | POA: Diagnosis not present

## 2022-12-23 DIAGNOSIS — K219 Gastro-esophageal reflux disease without esophagitis: Secondary | ICD-10-CM | POA: Diagnosis not present

## 2022-12-23 DIAGNOSIS — I10 Essential (primary) hypertension: Secondary | ICD-10-CM | POA: Diagnosis not present

## 2022-12-23 DIAGNOSIS — Z7902 Long term (current) use of antithrombotics/antiplatelets: Secondary | ICD-10-CM | POA: Diagnosis not present

## 2022-12-23 DIAGNOSIS — S42024D Nondisplaced fracture of shaft of right clavicle, subsequent encounter for fracture with routine healing: Secondary | ICD-10-CM | POA: Diagnosis not present

## 2022-12-23 DIAGNOSIS — Z556 Problems related to health literacy: Secondary | ICD-10-CM | POA: Diagnosis not present

## 2022-12-23 DIAGNOSIS — M1990 Unspecified osteoarthritis, unspecified site: Secondary | ICD-10-CM | POA: Diagnosis not present

## 2022-12-23 DIAGNOSIS — I252 Old myocardial infarction: Secondary | ICD-10-CM | POA: Diagnosis not present

## 2022-12-23 DIAGNOSIS — Z9181 History of falling: Secondary | ICD-10-CM | POA: Diagnosis not present

## 2022-12-23 DIAGNOSIS — T796XXD Traumatic ischemia of muscle, subsequent encounter: Secondary | ICD-10-CM | POA: Diagnosis not present

## 2022-12-23 DIAGNOSIS — F0390 Unspecified dementia without behavioral disturbance: Secondary | ICD-10-CM | POA: Diagnosis not present

## 2022-12-23 DIAGNOSIS — R1310 Dysphagia, unspecified: Secondary | ICD-10-CM | POA: Diagnosis not present

## 2023-01-02 DIAGNOSIS — K4030 Unilateral inguinal hernia, with obstruction, without gangrene, not specified as recurrent: Secondary | ICD-10-CM | POA: Diagnosis not present

## 2023-01-02 DIAGNOSIS — M6282 Rhabdomyolysis: Secondary | ICD-10-CM | POA: Diagnosis not present

## 2023-01-02 DIAGNOSIS — M19011 Primary osteoarthritis, right shoulder: Secondary | ICD-10-CM | POA: Diagnosis not present

## 2023-01-02 DIAGNOSIS — M48061 Spinal stenosis, lumbar region without neurogenic claudication: Secondary | ICD-10-CM | POA: Diagnosis not present

## 2023-01-02 DIAGNOSIS — G4733 Obstructive sleep apnea (adult) (pediatric): Secondary | ICD-10-CM | POA: Diagnosis not present

## 2023-01-02 DIAGNOSIS — E876 Hypokalemia: Secondary | ICD-10-CM | POA: Diagnosis not present

## 2023-01-02 DIAGNOSIS — S40011D Contusion of right shoulder, subsequent encounter: Secondary | ICD-10-CM | POA: Diagnosis not present

## 2023-01-02 DIAGNOSIS — I10 Essential (primary) hypertension: Secondary | ICD-10-CM | POA: Diagnosis not present

## 2023-01-02 DIAGNOSIS — E7800 Pure hypercholesterolemia, unspecified: Secondary | ICD-10-CM | POA: Diagnosis not present

## 2023-01-02 DIAGNOSIS — I252 Old myocardial infarction: Secondary | ICD-10-CM | POA: Diagnosis not present

## 2023-01-02 DIAGNOSIS — M51369 Other intervertebral disc degeneration, lumbar region without mention of lumbar back pain or lower extremity pain: Secondary | ICD-10-CM | POA: Diagnosis not present

## 2023-01-02 DIAGNOSIS — I25119 Atherosclerotic heart disease of native coronary artery with unspecified angina pectoris: Secondary | ICD-10-CM | POA: Diagnosis not present

## 2023-01-02 DIAGNOSIS — M47816 Spondylosis without myelopathy or radiculopathy, lumbar region: Secondary | ICD-10-CM | POA: Diagnosis not present

## 2023-01-02 DIAGNOSIS — I350 Nonrheumatic aortic (valve) stenosis: Secondary | ICD-10-CM | POA: Diagnosis not present

## 2023-01-02 DIAGNOSIS — K56609 Unspecified intestinal obstruction, unspecified as to partial versus complete obstruction: Secondary | ICD-10-CM | POA: Diagnosis not present

## 2023-01-02 DIAGNOSIS — F05 Delirium due to known physiological condition: Secondary | ICD-10-CM | POA: Diagnosis not present

## 2023-01-02 DIAGNOSIS — K219 Gastro-esophageal reflux disease without esophagitis: Secondary | ICD-10-CM | POA: Diagnosis not present

## 2023-01-02 DIAGNOSIS — F03C11 Unspecified dementia, severe, with agitation: Secondary | ICD-10-CM | POA: Diagnosis not present

## 2023-01-02 DIAGNOSIS — M5030 Other cervical disc degeneration, unspecified cervical region: Secondary | ICD-10-CM | POA: Diagnosis not present

## 2023-01-02 DIAGNOSIS — I700 Atherosclerosis of aorta: Secondary | ICD-10-CM | POA: Diagnosis not present

## 2023-01-02 DIAGNOSIS — M179 Osteoarthritis of knee, unspecified: Secondary | ICD-10-CM | POA: Diagnosis not present

## 2023-01-02 DIAGNOSIS — I959 Hypotension, unspecified: Secondary | ICD-10-CM | POA: Diagnosis not present

## 2023-01-02 DIAGNOSIS — K5900 Constipation, unspecified: Secondary | ICD-10-CM | POA: Diagnosis not present

## 2023-01-03 DIAGNOSIS — K56609 Unspecified intestinal obstruction, unspecified as to partial versus complete obstruction: Secondary | ICD-10-CM | POA: Diagnosis not present

## 2023-01-03 DIAGNOSIS — G4733 Obstructive sleep apnea (adult) (pediatric): Secondary | ICD-10-CM | POA: Diagnosis not present

## 2023-01-03 DIAGNOSIS — E7800 Pure hypercholesterolemia, unspecified: Secondary | ICD-10-CM | POA: Diagnosis not present

## 2023-01-03 DIAGNOSIS — I252 Old myocardial infarction: Secondary | ICD-10-CM | POA: Diagnosis not present

## 2023-01-03 DIAGNOSIS — M19011 Primary osteoarthritis, right shoulder: Secondary | ICD-10-CM | POA: Diagnosis not present

## 2023-01-03 DIAGNOSIS — K5900 Constipation, unspecified: Secondary | ICD-10-CM | POA: Diagnosis not present

## 2023-01-03 DIAGNOSIS — M179 Osteoarthritis of knee, unspecified: Secondary | ICD-10-CM | POA: Diagnosis not present

## 2023-01-03 DIAGNOSIS — K4030 Unilateral inguinal hernia, with obstruction, without gangrene, not specified as recurrent: Secondary | ICD-10-CM | POA: Diagnosis not present

## 2023-01-03 DIAGNOSIS — I25119 Atherosclerotic heart disease of native coronary artery with unspecified angina pectoris: Secondary | ICD-10-CM | POA: Diagnosis not present

## 2023-01-03 DIAGNOSIS — I959 Hypotension, unspecified: Secondary | ICD-10-CM | POA: Diagnosis not present

## 2023-01-03 DIAGNOSIS — K219 Gastro-esophageal reflux disease without esophagitis: Secondary | ICD-10-CM | POA: Diagnosis not present

## 2023-01-03 DIAGNOSIS — F03C11 Unspecified dementia, severe, with agitation: Secondary | ICD-10-CM | POA: Diagnosis not present

## 2023-01-03 DIAGNOSIS — F05 Delirium due to known physiological condition: Secondary | ICD-10-CM | POA: Diagnosis not present

## 2023-01-03 DIAGNOSIS — S40011D Contusion of right shoulder, subsequent encounter: Secondary | ICD-10-CM | POA: Diagnosis not present

## 2023-01-03 DIAGNOSIS — I10 Essential (primary) hypertension: Secondary | ICD-10-CM | POA: Diagnosis not present

## 2023-01-03 DIAGNOSIS — I700 Atherosclerosis of aorta: Secondary | ICD-10-CM | POA: Diagnosis not present

## 2023-01-05 DIAGNOSIS — F03918 Unspecified dementia, unspecified severity, with other behavioral disturbance: Secondary | ICD-10-CM | POA: Diagnosis not present

## 2023-01-05 DIAGNOSIS — E782 Mixed hyperlipidemia: Secondary | ICD-10-CM | POA: Diagnosis not present

## 2023-01-05 DIAGNOSIS — K5900 Constipation, unspecified: Secondary | ICD-10-CM | POA: Diagnosis not present

## 2023-01-05 DIAGNOSIS — R32 Unspecified urinary incontinence: Secondary | ICD-10-CM | POA: Diagnosis not present

## 2023-01-06 DIAGNOSIS — N390 Urinary tract infection, site not specified: Secondary | ICD-10-CM | POA: Diagnosis not present

## 2023-01-10 DIAGNOSIS — K5900 Constipation, unspecified: Secondary | ICD-10-CM | POA: Diagnosis not present

## 2023-01-10 DIAGNOSIS — N390 Urinary tract infection, site not specified: Secondary | ICD-10-CM | POA: Diagnosis not present

## 2023-01-14 DIAGNOSIS — E119 Type 2 diabetes mellitus without complications: Secondary | ICD-10-CM | POA: Diagnosis not present

## 2023-01-14 DIAGNOSIS — E039 Hypothyroidism, unspecified: Secondary | ICD-10-CM | POA: Diagnosis not present

## 2023-01-14 DIAGNOSIS — E559 Vitamin D deficiency, unspecified: Secondary | ICD-10-CM | POA: Diagnosis not present

## 2023-01-14 DIAGNOSIS — I700 Atherosclerosis of aorta: Secondary | ICD-10-CM | POA: Diagnosis not present

## 2023-01-14 DIAGNOSIS — I10 Essential (primary) hypertension: Secondary | ICD-10-CM | POA: Diagnosis not present

## 2023-01-14 DIAGNOSIS — K4030 Unilateral inguinal hernia, with obstruction, without gangrene, not specified as recurrent: Secondary | ICD-10-CM | POA: Diagnosis not present

## 2023-01-14 DIAGNOSIS — K5900 Constipation, unspecified: Secondary | ICD-10-CM | POA: Diagnosis not present

## 2023-01-14 DIAGNOSIS — E782 Mixed hyperlipidemia: Secondary | ICD-10-CM | POA: Diagnosis not present

## 2023-01-14 DIAGNOSIS — K219 Gastro-esophageal reflux disease without esophagitis: Secondary | ICD-10-CM | POA: Diagnosis not present

## 2023-01-14 DIAGNOSIS — I252 Old myocardial infarction: Secondary | ICD-10-CM | POA: Diagnosis not present

## 2023-01-14 DIAGNOSIS — F05 Delirium due to known physiological condition: Secondary | ICD-10-CM | POA: Diagnosis not present

## 2023-01-14 DIAGNOSIS — F03C11 Unspecified dementia, severe, with agitation: Secondary | ICD-10-CM | POA: Diagnosis not present

## 2023-01-14 DIAGNOSIS — S40011D Contusion of right shoulder, subsequent encounter: Secondary | ICD-10-CM | POA: Diagnosis not present

## 2023-01-14 DIAGNOSIS — E7800 Pure hypercholesterolemia, unspecified: Secondary | ICD-10-CM | POA: Diagnosis not present

## 2023-01-14 DIAGNOSIS — I25119 Atherosclerotic heart disease of native coronary artery with unspecified angina pectoris: Secondary | ICD-10-CM | POA: Diagnosis not present

## 2023-01-14 DIAGNOSIS — M19011 Primary osteoarthritis, right shoulder: Secondary | ICD-10-CM | POA: Diagnosis not present

## 2023-01-14 DIAGNOSIS — G4733 Obstructive sleep apnea (adult) (pediatric): Secondary | ICD-10-CM | POA: Diagnosis not present

## 2023-01-14 DIAGNOSIS — I959 Hypotension, unspecified: Secondary | ICD-10-CM | POA: Diagnosis not present

## 2023-01-14 DIAGNOSIS — M179 Osteoarthritis of knee, unspecified: Secondary | ICD-10-CM | POA: Diagnosis not present

## 2023-01-14 DIAGNOSIS — K56609 Unspecified intestinal obstruction, unspecified as to partial versus complete obstruction: Secondary | ICD-10-CM | POA: Diagnosis not present

## 2023-01-17 DIAGNOSIS — E559 Vitamin D deficiency, unspecified: Secondary | ICD-10-CM | POA: Diagnosis not present

## 2023-01-17 DIAGNOSIS — K5900 Constipation, unspecified: Secondary | ICD-10-CM | POA: Diagnosis not present

## 2023-01-17 DIAGNOSIS — N390 Urinary tract infection, site not specified: Secondary | ICD-10-CM | POA: Diagnosis not present

## 2023-01-18 DIAGNOSIS — I25119 Atherosclerotic heart disease of native coronary artery with unspecified angina pectoris: Secondary | ICD-10-CM | POA: Diagnosis not present

## 2023-01-18 DIAGNOSIS — I252 Old myocardial infarction: Secondary | ICD-10-CM | POA: Diagnosis not present

## 2023-01-18 DIAGNOSIS — M179 Osteoarthritis of knee, unspecified: Secondary | ICD-10-CM | POA: Diagnosis not present

## 2023-01-18 DIAGNOSIS — E7800 Pure hypercholesterolemia, unspecified: Secondary | ICD-10-CM | POA: Diagnosis not present

## 2023-01-18 DIAGNOSIS — F03C11 Unspecified dementia, severe, with agitation: Secondary | ICD-10-CM | POA: Diagnosis not present

## 2023-01-18 DIAGNOSIS — K56609 Unspecified intestinal obstruction, unspecified as to partial versus complete obstruction: Secondary | ICD-10-CM | POA: Diagnosis not present

## 2023-01-18 DIAGNOSIS — K219 Gastro-esophageal reflux disease without esophagitis: Secondary | ICD-10-CM | POA: Diagnosis not present

## 2023-01-18 DIAGNOSIS — I700 Atherosclerosis of aorta: Secondary | ICD-10-CM | POA: Diagnosis not present

## 2023-01-18 DIAGNOSIS — I10 Essential (primary) hypertension: Secondary | ICD-10-CM | POA: Diagnosis not present

## 2023-01-18 DIAGNOSIS — M19011 Primary osteoarthritis, right shoulder: Secondary | ICD-10-CM | POA: Diagnosis not present

## 2023-01-18 DIAGNOSIS — K5900 Constipation, unspecified: Secondary | ICD-10-CM | POA: Diagnosis not present

## 2023-01-18 DIAGNOSIS — K4030 Unilateral inguinal hernia, with obstruction, without gangrene, not specified as recurrent: Secondary | ICD-10-CM | POA: Diagnosis not present

## 2023-01-18 DIAGNOSIS — S40011D Contusion of right shoulder, subsequent encounter: Secondary | ICD-10-CM | POA: Diagnosis not present

## 2023-01-18 DIAGNOSIS — I959 Hypotension, unspecified: Secondary | ICD-10-CM | POA: Diagnosis not present

## 2023-01-18 DIAGNOSIS — F05 Delirium due to known physiological condition: Secondary | ICD-10-CM | POA: Diagnosis not present

## 2023-01-18 DIAGNOSIS — G4733 Obstructive sleep apnea (adult) (pediatric): Secondary | ICD-10-CM | POA: Diagnosis not present

## 2023-01-21 DIAGNOSIS — K4030 Unilateral inguinal hernia, with obstruction, without gangrene, not specified as recurrent: Secondary | ICD-10-CM | POA: Diagnosis not present

## 2023-01-21 DIAGNOSIS — E7800 Pure hypercholesterolemia, unspecified: Secondary | ICD-10-CM | POA: Diagnosis not present

## 2023-01-21 DIAGNOSIS — K56609 Unspecified intestinal obstruction, unspecified as to partial versus complete obstruction: Secondary | ICD-10-CM | POA: Diagnosis not present

## 2023-01-21 DIAGNOSIS — S40011D Contusion of right shoulder, subsequent encounter: Secondary | ICD-10-CM | POA: Diagnosis not present

## 2023-01-21 DIAGNOSIS — I25119 Atherosclerotic heart disease of native coronary artery with unspecified angina pectoris: Secondary | ICD-10-CM | POA: Diagnosis not present

## 2023-01-21 DIAGNOSIS — F05 Delirium due to known physiological condition: Secondary | ICD-10-CM | POA: Diagnosis not present

## 2023-01-21 DIAGNOSIS — M179 Osteoarthritis of knee, unspecified: Secondary | ICD-10-CM | POA: Diagnosis not present

## 2023-01-21 DIAGNOSIS — I252 Old myocardial infarction: Secondary | ICD-10-CM | POA: Diagnosis not present

## 2023-01-21 DIAGNOSIS — K219 Gastro-esophageal reflux disease without esophagitis: Secondary | ICD-10-CM | POA: Diagnosis not present

## 2023-01-21 DIAGNOSIS — M19011 Primary osteoarthritis, right shoulder: Secondary | ICD-10-CM | POA: Diagnosis not present

## 2023-01-21 DIAGNOSIS — G4733 Obstructive sleep apnea (adult) (pediatric): Secondary | ICD-10-CM | POA: Diagnosis not present

## 2023-01-21 DIAGNOSIS — F03C11 Unspecified dementia, severe, with agitation: Secondary | ICD-10-CM | POA: Diagnosis not present

## 2023-01-21 DIAGNOSIS — I10 Essential (primary) hypertension: Secondary | ICD-10-CM | POA: Diagnosis not present

## 2023-01-21 DIAGNOSIS — I700 Atherosclerosis of aorta: Secondary | ICD-10-CM | POA: Diagnosis not present

## 2023-01-21 DIAGNOSIS — I959 Hypotension, unspecified: Secondary | ICD-10-CM | POA: Diagnosis not present

## 2023-01-21 DIAGNOSIS — K5900 Constipation, unspecified: Secondary | ICD-10-CM | POA: Diagnosis not present

## 2023-01-24 DIAGNOSIS — M19011 Primary osteoarthritis, right shoulder: Secondary | ICD-10-CM | POA: Diagnosis not present

## 2023-01-24 DIAGNOSIS — K56609 Unspecified intestinal obstruction, unspecified as to partial versus complete obstruction: Secondary | ICD-10-CM | POA: Diagnosis not present

## 2023-01-24 DIAGNOSIS — I959 Hypotension, unspecified: Secondary | ICD-10-CM | POA: Diagnosis not present

## 2023-01-24 DIAGNOSIS — I700 Atherosclerosis of aorta: Secondary | ICD-10-CM | POA: Diagnosis not present

## 2023-01-24 DIAGNOSIS — I25119 Atherosclerotic heart disease of native coronary artery with unspecified angina pectoris: Secondary | ICD-10-CM | POA: Diagnosis not present

## 2023-01-24 DIAGNOSIS — K4030 Unilateral inguinal hernia, with obstruction, without gangrene, not specified as recurrent: Secondary | ICD-10-CM | POA: Diagnosis not present

## 2023-01-24 DIAGNOSIS — K219 Gastro-esophageal reflux disease without esophagitis: Secondary | ICD-10-CM | POA: Diagnosis not present

## 2023-01-24 DIAGNOSIS — I10 Essential (primary) hypertension: Secondary | ICD-10-CM | POA: Diagnosis not present

## 2023-01-24 DIAGNOSIS — M179 Osteoarthritis of knee, unspecified: Secondary | ICD-10-CM | POA: Diagnosis not present

## 2023-01-24 DIAGNOSIS — S40011D Contusion of right shoulder, subsequent encounter: Secondary | ICD-10-CM | POA: Diagnosis not present

## 2023-01-24 DIAGNOSIS — I252 Old myocardial infarction: Secondary | ICD-10-CM | POA: Diagnosis not present

## 2023-01-24 DIAGNOSIS — F05 Delirium due to known physiological condition: Secondary | ICD-10-CM | POA: Diagnosis not present

## 2023-01-24 DIAGNOSIS — G4733 Obstructive sleep apnea (adult) (pediatric): Secondary | ICD-10-CM | POA: Diagnosis not present

## 2023-01-24 DIAGNOSIS — F03C11 Unspecified dementia, severe, with agitation: Secondary | ICD-10-CM | POA: Diagnosis not present

## 2023-01-24 DIAGNOSIS — E7800 Pure hypercholesterolemia, unspecified: Secondary | ICD-10-CM | POA: Diagnosis not present

## 2023-01-24 DIAGNOSIS — K5900 Constipation, unspecified: Secondary | ICD-10-CM | POA: Diagnosis not present

## 2023-01-26 DIAGNOSIS — I252 Old myocardial infarction: Secondary | ICD-10-CM | POA: Diagnosis not present

## 2023-01-26 DIAGNOSIS — M19011 Primary osteoarthritis, right shoulder: Secondary | ICD-10-CM | POA: Diagnosis not present

## 2023-01-26 DIAGNOSIS — I700 Atherosclerosis of aorta: Secondary | ICD-10-CM | POA: Diagnosis not present

## 2023-01-26 DIAGNOSIS — K219 Gastro-esophageal reflux disease without esophagitis: Secondary | ICD-10-CM | POA: Diagnosis not present

## 2023-01-26 DIAGNOSIS — K56609 Unspecified intestinal obstruction, unspecified as to partial versus complete obstruction: Secondary | ICD-10-CM | POA: Diagnosis not present

## 2023-01-26 DIAGNOSIS — G4733 Obstructive sleep apnea (adult) (pediatric): Secondary | ICD-10-CM | POA: Diagnosis not present

## 2023-01-26 DIAGNOSIS — F05 Delirium due to known physiological condition: Secondary | ICD-10-CM | POA: Diagnosis not present

## 2023-01-26 DIAGNOSIS — K4030 Unilateral inguinal hernia, with obstruction, without gangrene, not specified as recurrent: Secondary | ICD-10-CM | POA: Diagnosis not present

## 2023-01-26 DIAGNOSIS — F03C11 Unspecified dementia, severe, with agitation: Secondary | ICD-10-CM | POA: Diagnosis not present

## 2023-01-26 DIAGNOSIS — K5900 Constipation, unspecified: Secondary | ICD-10-CM | POA: Diagnosis not present

## 2023-01-26 DIAGNOSIS — S40011D Contusion of right shoulder, subsequent encounter: Secondary | ICD-10-CM | POA: Diagnosis not present

## 2023-01-26 DIAGNOSIS — I25119 Atherosclerotic heart disease of native coronary artery with unspecified angina pectoris: Secondary | ICD-10-CM | POA: Diagnosis not present

## 2023-01-26 DIAGNOSIS — I959 Hypotension, unspecified: Secondary | ICD-10-CM | POA: Diagnosis not present

## 2023-01-26 DIAGNOSIS — M179 Osteoarthritis of knee, unspecified: Secondary | ICD-10-CM | POA: Diagnosis not present

## 2023-01-26 DIAGNOSIS — E7800 Pure hypercholesterolemia, unspecified: Secondary | ICD-10-CM | POA: Diagnosis not present

## 2023-01-26 DIAGNOSIS — I10 Essential (primary) hypertension: Secondary | ICD-10-CM | POA: Diagnosis not present

## 2023-01-31 DIAGNOSIS — I10 Essential (primary) hypertension: Secondary | ICD-10-CM | POA: Diagnosis not present

## 2023-01-31 DIAGNOSIS — F05 Delirium due to known physiological condition: Secondary | ICD-10-CM | POA: Diagnosis not present

## 2023-01-31 DIAGNOSIS — I209 Angina pectoris, unspecified: Secondary | ICD-10-CM | POA: Diagnosis not present

## 2023-01-31 DIAGNOSIS — G4733 Obstructive sleep apnea (adult) (pediatric): Secondary | ICD-10-CM | POA: Diagnosis not present

## 2023-01-31 DIAGNOSIS — I119 Hypertensive heart disease without heart failure: Secondary | ICD-10-CM | POA: Diagnosis not present

## 2023-01-31 DIAGNOSIS — M19011 Primary osteoarthritis, right shoulder: Secondary | ICD-10-CM | POA: Diagnosis not present

## 2023-01-31 DIAGNOSIS — I25119 Atherosclerotic heart disease of native coronary artery with unspecified angina pectoris: Secondary | ICD-10-CM | POA: Diagnosis not present

## 2023-01-31 DIAGNOSIS — I252 Old myocardial infarction: Secondary | ICD-10-CM | POA: Diagnosis not present

## 2023-01-31 DIAGNOSIS — I959 Hypotension, unspecified: Secondary | ICD-10-CM | POA: Diagnosis not present

## 2023-01-31 DIAGNOSIS — K219 Gastro-esophageal reflux disease without esophagitis: Secondary | ICD-10-CM | POA: Diagnosis not present

## 2023-01-31 DIAGNOSIS — S40011D Contusion of right shoulder, subsequent encounter: Secondary | ICD-10-CM | POA: Diagnosis not present

## 2023-01-31 DIAGNOSIS — F03C11 Unspecified dementia, severe, with agitation: Secondary | ICD-10-CM | POA: Diagnosis not present

## 2023-01-31 DIAGNOSIS — K56609 Unspecified intestinal obstruction, unspecified as to partial versus complete obstruction: Secondary | ICD-10-CM | POA: Diagnosis not present

## 2023-01-31 DIAGNOSIS — D519 Vitamin B12 deficiency anemia, unspecified: Secondary | ICD-10-CM | POA: Diagnosis not present

## 2023-01-31 DIAGNOSIS — E7800 Pure hypercholesterolemia, unspecified: Secondary | ICD-10-CM | POA: Diagnosis not present

## 2023-01-31 DIAGNOSIS — K5900 Constipation, unspecified: Secondary | ICD-10-CM | POA: Diagnosis not present

## 2023-01-31 DIAGNOSIS — M179 Osteoarthritis of knee, unspecified: Secondary | ICD-10-CM | POA: Diagnosis not present

## 2023-01-31 DIAGNOSIS — K4030 Unilateral inguinal hernia, with obstruction, without gangrene, not specified as recurrent: Secondary | ICD-10-CM | POA: Diagnosis not present

## 2023-01-31 DIAGNOSIS — I700 Atherosclerosis of aorta: Secondary | ICD-10-CM | POA: Diagnosis not present

## 2023-02-01 DIAGNOSIS — I700 Atherosclerosis of aorta: Secondary | ICD-10-CM | POA: Diagnosis not present

## 2023-02-01 DIAGNOSIS — S40011D Contusion of right shoulder, subsequent encounter: Secondary | ICD-10-CM | POA: Diagnosis not present

## 2023-02-01 DIAGNOSIS — G4733 Obstructive sleep apnea (adult) (pediatric): Secondary | ICD-10-CM | POA: Diagnosis not present

## 2023-02-01 DIAGNOSIS — K219 Gastro-esophageal reflux disease without esophagitis: Secondary | ICD-10-CM | POA: Diagnosis not present

## 2023-02-01 DIAGNOSIS — I25119 Atherosclerotic heart disease of native coronary artery with unspecified angina pectoris: Secondary | ICD-10-CM | POA: Diagnosis not present

## 2023-02-01 DIAGNOSIS — I10 Essential (primary) hypertension: Secondary | ICD-10-CM | POA: Diagnosis not present

## 2023-02-01 DIAGNOSIS — E7800 Pure hypercholesterolemia, unspecified: Secondary | ICD-10-CM | POA: Diagnosis not present

## 2023-02-01 DIAGNOSIS — K56609 Unspecified intestinal obstruction, unspecified as to partial versus complete obstruction: Secondary | ICD-10-CM | POA: Diagnosis not present

## 2023-02-01 DIAGNOSIS — M5030 Other cervical disc degeneration, unspecified cervical region: Secondary | ICD-10-CM | POA: Diagnosis not present

## 2023-02-01 DIAGNOSIS — M47816 Spondylosis without myelopathy or radiculopathy, lumbar region: Secondary | ICD-10-CM | POA: Diagnosis not present

## 2023-02-01 DIAGNOSIS — M48061 Spinal stenosis, lumbar region without neurogenic claudication: Secondary | ICD-10-CM | POA: Diagnosis not present

## 2023-02-01 DIAGNOSIS — M179 Osteoarthritis of knee, unspecified: Secondary | ICD-10-CM | POA: Diagnosis not present

## 2023-02-01 DIAGNOSIS — M51369 Other intervertebral disc degeneration, lumbar region without mention of lumbar back pain or lower extremity pain: Secondary | ICD-10-CM | POA: Diagnosis not present

## 2023-02-01 DIAGNOSIS — F05 Delirium due to known physiological condition: Secondary | ICD-10-CM | POA: Diagnosis not present

## 2023-02-01 DIAGNOSIS — E876 Hypokalemia: Secondary | ICD-10-CM | POA: Diagnosis not present

## 2023-02-01 DIAGNOSIS — M19011 Primary osteoarthritis, right shoulder: Secondary | ICD-10-CM | POA: Diagnosis not present

## 2023-02-01 DIAGNOSIS — I252 Old myocardial infarction: Secondary | ICD-10-CM | POA: Diagnosis not present

## 2023-02-01 DIAGNOSIS — I350 Nonrheumatic aortic (valve) stenosis: Secondary | ICD-10-CM | POA: Diagnosis not present

## 2023-02-01 DIAGNOSIS — M6282 Rhabdomyolysis: Secondary | ICD-10-CM | POA: Diagnosis not present

## 2023-02-01 DIAGNOSIS — F03C11 Unspecified dementia, severe, with agitation: Secondary | ICD-10-CM | POA: Diagnosis not present

## 2023-02-01 DIAGNOSIS — K4030 Unilateral inguinal hernia, with obstruction, without gangrene, not specified as recurrent: Secondary | ICD-10-CM | POA: Diagnosis not present

## 2023-02-01 DIAGNOSIS — K5900 Constipation, unspecified: Secondary | ICD-10-CM | POA: Diagnosis not present

## 2023-02-01 DIAGNOSIS — I959 Hypotension, unspecified: Secondary | ICD-10-CM | POA: Diagnosis not present

## 2023-02-11 DIAGNOSIS — S40011D Contusion of right shoulder, subsequent encounter: Secondary | ICD-10-CM | POA: Diagnosis not present

## 2023-02-11 DIAGNOSIS — K4030 Unilateral inguinal hernia, with obstruction, without gangrene, not specified as recurrent: Secondary | ICD-10-CM | POA: Diagnosis not present

## 2023-02-11 DIAGNOSIS — E7800 Pure hypercholesterolemia, unspecified: Secondary | ICD-10-CM | POA: Diagnosis not present

## 2023-02-11 DIAGNOSIS — I252 Old myocardial infarction: Secondary | ICD-10-CM | POA: Diagnosis not present

## 2023-02-11 DIAGNOSIS — F03C11 Unspecified dementia, severe, with agitation: Secondary | ICD-10-CM | POA: Diagnosis not present

## 2023-02-11 DIAGNOSIS — I959 Hypotension, unspecified: Secondary | ICD-10-CM | POA: Diagnosis not present

## 2023-02-11 DIAGNOSIS — I700 Atherosclerosis of aorta: Secondary | ICD-10-CM | POA: Diagnosis not present

## 2023-02-11 DIAGNOSIS — K56609 Unspecified intestinal obstruction, unspecified as to partial versus complete obstruction: Secondary | ICD-10-CM | POA: Diagnosis not present

## 2023-02-11 DIAGNOSIS — M179 Osteoarthritis of knee, unspecified: Secondary | ICD-10-CM | POA: Diagnosis not present

## 2023-02-11 DIAGNOSIS — I25119 Atherosclerotic heart disease of native coronary artery with unspecified angina pectoris: Secondary | ICD-10-CM | POA: Diagnosis not present

## 2023-02-11 DIAGNOSIS — F05 Delirium due to known physiological condition: Secondary | ICD-10-CM | POA: Diagnosis not present

## 2023-02-11 DIAGNOSIS — M19011 Primary osteoarthritis, right shoulder: Secondary | ICD-10-CM | POA: Diagnosis not present

## 2023-02-11 DIAGNOSIS — K5900 Constipation, unspecified: Secondary | ICD-10-CM | POA: Diagnosis not present

## 2023-02-11 DIAGNOSIS — G4733 Obstructive sleep apnea (adult) (pediatric): Secondary | ICD-10-CM | POA: Diagnosis not present

## 2023-02-11 DIAGNOSIS — K219 Gastro-esophageal reflux disease without esophagitis: Secondary | ICD-10-CM | POA: Diagnosis not present

## 2023-02-11 DIAGNOSIS — I10 Essential (primary) hypertension: Secondary | ICD-10-CM | POA: Diagnosis not present

## 2023-02-14 ENCOUNTER — Encounter (HOSPITAL_COMMUNITY): Payer: Self-pay

## 2023-02-14 ENCOUNTER — Emergency Department (HOSPITAL_COMMUNITY): Payer: Medicare Other

## 2023-02-14 ENCOUNTER — Emergency Department (HOSPITAL_COMMUNITY)
Admission: EM | Admit: 2023-02-14 | Discharge: 2023-02-14 | Disposition: A | Discharge status: Skilled Nursing Facility | Payer: Medicare Other | Attending: Emergency Medicine | Admitting: Emergency Medicine

## 2023-02-14 ENCOUNTER — Other Ambulatory Visit: Payer: Self-pay

## 2023-02-14 DIAGNOSIS — M1611 Unilateral primary osteoarthritis, right hip: Secondary | ICD-10-CM | POA: Diagnosis not present

## 2023-02-14 DIAGNOSIS — Z7901 Long term (current) use of anticoagulants: Secondary | ICD-10-CM | POA: Diagnosis not present

## 2023-02-14 DIAGNOSIS — S299XXA Unspecified injury of thorax, initial encounter: Secondary | ICD-10-CM | POA: Diagnosis not present

## 2023-02-14 DIAGNOSIS — W06XXXA Fall from bed, initial encounter: Secondary | ICD-10-CM | POA: Diagnosis not present

## 2023-02-14 DIAGNOSIS — M51369 Other intervertebral disc degeneration, lumbar region without mention of lumbar back pain or lower extremity pain: Secondary | ICD-10-CM | POA: Diagnosis not present

## 2023-02-14 DIAGNOSIS — M25551 Pain in right hip: Secondary | ICD-10-CM | POA: Diagnosis not present

## 2023-02-14 DIAGNOSIS — M160 Bilateral primary osteoarthritis of hip: Secondary | ICD-10-CM | POA: Diagnosis not present

## 2023-02-14 DIAGNOSIS — W19XXXA Unspecified fall, initial encounter: Secondary | ICD-10-CM | POA: Diagnosis not present

## 2023-02-14 DIAGNOSIS — S7001XA Contusion of right hip, initial encounter: Secondary | ICD-10-CM | POA: Insufficient documentation

## 2023-02-14 DIAGNOSIS — M47816 Spondylosis without myelopathy or radiculopathy, lumbar region: Secondary | ICD-10-CM | POA: Diagnosis not present

## 2023-02-14 DIAGNOSIS — R9089 Other abnormal findings on diagnostic imaging of central nervous system: Secondary | ICD-10-CM | POA: Diagnosis not present

## 2023-02-14 DIAGNOSIS — S0990XA Unspecified injury of head, initial encounter: Secondary | ICD-10-CM | POA: Diagnosis not present

## 2023-02-14 DIAGNOSIS — S199XXA Unspecified injury of neck, initial encounter: Secondary | ICD-10-CM | POA: Diagnosis not present

## 2023-02-14 DIAGNOSIS — S3992XA Unspecified injury of lower back, initial encounter: Secondary | ICD-10-CM | POA: Diagnosis not present

## 2023-02-14 DIAGNOSIS — S79911A Unspecified injury of right hip, initial encounter: Secondary | ICD-10-CM | POA: Diagnosis not present

## 2023-02-14 DIAGNOSIS — R519 Headache, unspecified: Secondary | ICD-10-CM | POA: Insufficient documentation

## 2023-02-14 DIAGNOSIS — R609 Edema, unspecified: Secondary | ICD-10-CM | POA: Diagnosis not present

## 2023-02-14 LAB — CBC WITH DIFFERENTIAL/PLATELET
Abs Immature Granulocytes: 0.02 10*3/uL (ref 0.00–0.07)
Basophils Absolute: 0.1 10*3/uL (ref 0.0–0.1)
Basophils Relative: 1 %
Eosinophils Absolute: 0.1 10*3/uL (ref 0.0–0.5)
Eosinophils Relative: 2 %
HCT: 36.9 % — ABNORMAL LOW (ref 39.0–52.0)
Hemoglobin: 11.8 g/dL — ABNORMAL LOW (ref 13.0–17.0)
Immature Granulocytes: 0 %
Lymphocytes Relative: 15 %
Lymphs Abs: 0.8 10*3/uL (ref 0.7–4.0)
MCH: 31.1 pg (ref 26.0–34.0)
MCHC: 32 g/dL (ref 30.0–36.0)
MCV: 97.4 fL (ref 80.0–100.0)
Monocytes Absolute: 0.6 10*3/uL (ref 0.1–1.0)
Monocytes Relative: 10 %
Neutro Abs: 4 10*3/uL (ref 1.7–7.7)
Neutrophils Relative %: 72 %
Platelets: 245 10*3/uL (ref 150–400)
RBC: 3.79 MIL/uL — ABNORMAL LOW (ref 4.22–5.81)
RDW: 14 % (ref 11.5–15.5)
WBC: 5.6 10*3/uL (ref 4.0–10.5)
nRBC: 0 % (ref 0.0–0.2)

## 2023-02-14 LAB — BASIC METABOLIC PANEL
Anion gap: 8 (ref 5–15)
BUN: 24 mg/dL — ABNORMAL HIGH (ref 8–23)
CO2: 26 mmol/L (ref 22–32)
Calcium: 9.4 mg/dL (ref 8.9–10.3)
Chloride: 110 mmol/L (ref 98–111)
Creatinine, Ser: 1 mg/dL (ref 0.61–1.24)
GFR, Estimated: >60 mL/min (ref >60)
Glucose, Bld: 106 mg/dL — ABNORMAL HIGH (ref 70–99)
Potassium: 4.1 mmol/L (ref 3.5–5.1)
Sodium: 144 mmol/L (ref 135–145)

## 2023-02-14 LAB — URINALYSIS, W/ REFLEX TO CULTURE (INFECTION SUSPECTED)
Bacteria, UA: NONE SEEN
Bilirubin Urine: NEGATIVE
Glucose, UA: NEGATIVE mg/dL
Ketones, ur: NEGATIVE mg/dL
Leukocytes,Ua: NEGATIVE
Nitrite: NEGATIVE
Protein, ur: NEGATIVE mg/dL
Specific Gravity, Urine: 1.018 (ref 1.005–1.030)
pH: 5 (ref 5.0–8.0)

## 2023-02-14 MED ORDER — HYDROMORPHONE HCL 1 MG/ML IJ SOLN
1.0000 mg | Freq: Once | INTRAMUSCULAR | Status: AC
  Administered 2023-02-14: 1 mg via INTRAVENOUS
  Filled 2023-02-14: qty 1

## 2023-02-14 MED ORDER — HYDROCODONE-ACETAMINOPHEN 5-325 MG PO TABS: 1.0000 | ORAL_TABLET | Freq: Four times a day (QID) | ORAL | 0 refills | Status: DC | PRN

## 2023-02-14 MED ORDER — ONDANSETRON HCL 4 MG/2ML IJ SOLN
4.0000 mg | Freq: Once | INTRAMUSCULAR | Status: AC
  Administered 2023-02-14: 4 mg via INTRAVENOUS
  Filled 2023-02-14: qty 2

## 2023-02-14 MED ORDER — HYDROCODONE-ACETAMINOPHEN 5-325 MG PO TABS: 1.0000 | ORAL_TABLET | Freq: Four times a day (QID) | ORAL | 0 refills | Status: AC | PRN

## 2023-02-14 MED ORDER — MORPHINE SULFATE (PF) 4 MG/ML IV SOLN
4.0000 mg | Freq: Once | INTRAVENOUS | Status: AC
  Administered 2023-02-14: 4 mg via INTRAVENOUS
  Filled 2023-02-14: qty 1

## 2023-02-14 MED ORDER — HALOPERIDOL LACTATE 5 MG/ML IJ SOLN
5.0000 mg | Freq: Once | INTRAMUSCULAR | Status: AC
  Administered 2023-02-14: 5 mg via INTRAVENOUS
  Filled 2023-02-14: qty 1

## 2023-02-14 NOTE — ED Provider Notes (Signed)
11:57 AM I discussed the CT results with the patient's caregivers, sitter and sister.  Patient without evidence for fracture with evidence for gluteal hematoma, patient can return to his nursing facility.   Gerhard Munch, MD 02/14/23 [phone]

## 2023-02-14 NOTE — ED Notes (Signed)
Pt had small bm. Incontinent care provided. Clean gown and brief applied.

## 2023-02-14 NOTE — ED Triage Notes (Signed)
Pt arrived via [facility] from [facility]. Staff called EMS due to Pt falling from bed and hitting his wheelchair. Pt has injury to right hip and sacral area. Pt has Hx of dementia and per staff, Pt is semi-ambulatory.

## 2023-02-14 NOTE — ED Notes (Signed)
Patient transported to CT 

## 2023-02-14 NOTE — Discharge Instructions (Signed)
Today's evaluation has been generally reassuring.  Although there are no fractures identified on radiographic images, there is evidence for a substantial hematoma or bruise within the gluteus muscle. Please discuss your medication regimen with your physician to ensure that you are on appropriate product. Return here for concerning changes in your condition.

## 2023-02-14 NOTE — ED Notes (Signed)
Update given to [place] at [place].

## 2023-02-14 NOTE — ED Notes (Signed)
Patient transported to X-ray 

## 2023-02-14 NOTE — ED Provider Notes (Signed)
[place] EMERGENCY DEPARTMENT AT [facility] Provider Note   CSN: [id number] Arrival date & time: 02/14/23  4098     History  Chief Complaint  Patient presents with   Carlos [name] is a 82 y.o. male.  Brought to the emergency department by ambulance from skilled nursing facility.  Patient reportedly fell from the bed and onto his wheelchair.  Complaining of pain in the right hip/buttocks area.       Home Medications Prior to Admission medications   Medication Sig Start Date End Date Taking? Authorizing Provider  b complex vitamins tablet Take 1 tablet by mouth every Monday.    [provider]  calcium carbonate (TUMS EX) 750 MG chewable tablet Chew 1 tablet by mouth every 8 (eight) hours as needed for heartburn.    [provider]  clopidogrel (PLAVIX) 75 MG tablet Take 1 tablet (75 mg total) by mouth daily. 09/30/21   Baldo Daub, MD  docusate sodium (COLACE) 100 MG capsule Take 1 capsule (100 mg total) by mouth 2 (two) times daily. Patient not taking: Reported on 12/08/2022 08/06/22   Lorin Glass, MD  Glucosamine HCl (GLUCOSAMINE PO) Take 2 tablets by mouth 2 (two) times daily.    [provider]  loperamide (IMODIUM A-D) 2 MG tablet Take 2 mg by mouth every 6 (six) hours as needed for diarrhea or loose stools.    [provider]  metoprolol tartrate (LOPRESSOR) 25 MG tablet Take 0.5 tablets (12.5 mg total) by mouth 2 (two) times daily. 12/13/22   Danford, Earl Lites, MD  Multiple Vitamin (MULTIVITAMIN) tablet Take 1 tablet by mouth daily with breakfast.    [provider]  nitroGLYCERIN (NITROSTAT) 0.4 MG SL tablet Place 1 tablet (0.4 mg total) under the tongue every 5 (five) minutes x 3 doses as needed for chest pain. Patient taking differently: Place 0.4 mg under the tongue every 5 (five) minutes x 3 doses as needed for chest pain (CALL MD, IF NO RELIEF). 09/30/21   Baldo Daub, MD  Omega-3 Fatty  Acids (FISH OIL OMEGA-3) 1000 MG CAPS Take 1,000 mg by mouth daily.    [provider]  polyethylene glycol (MIRALAX / GLYCOLAX) 17 g packet Take 17 g by mouth daily as needed for mild constipation. Patient taking differently: Take 17 g by mouth daily as needed for mild constipation (to be mixed into 4 ounces of water). 08/06/22   Lorin Glass, MD  QUEtiapine (SEROQUEL) 25 MG tablet Take 1 tablet (25 mg total) by mouth 2 (two) times daily. Patient taking differently: Take 25 mg by mouth at bedtime. 08/06/22   Lorin Glass, MD  simvastatin (ZOCOR) 40 MG tablet Take 40 mg by mouth every evening.    [provider]  vitamin E 180 MG (400 UNITS) capsule Take 400 Units by mouth daily.    [provider]      Allergies    Patient has no known allergies.    Review of Systems   Review of Systems  Physical Exam Updated Vital Signs BP (!) 110/94   Pulse (!) 121   Temp 98.1 F (36.7 C) (Oral)   Resp 17   Ht 5\' 10"  (1.778 m)   Wt 65.3 kg   SpO2 94%   BMI 20.66 kg/m  Physical Exam Vitals and nursing note reviewed.  Constitutional:      General: He is not in acute distress.    Appearance: He is  well-developed.  HENT:     Head: Normocephalic and atraumatic.     Mouth/Throat:     Mouth: Mucous membranes are moist.  Eyes:     General: Vision grossly intact. Gaze aligned appropriately.     Extraocular Movements: Extraocular movements intact.     Conjunctiva/sclera: Conjunctivae normal.  Cardiovascular:     Rate and Rhythm: Normal rate and regular rhythm.     Pulses: Normal pulses.     Heart sounds: Normal heart sounds, S1 normal and S2 normal. No murmur heard.    No friction rub. No gallop.  Pulmonary:     Effort: Pulmonary effort is normal. No respiratory distress.     Breath sounds: Normal breath sounds.  Abdominal:     Palpations: Abdomen is soft.     Tenderness: There is no abdominal tenderness. There is no guarding or rebound.     Hernia: No hernia is  present.  Musculoskeletal:        General: No swelling.     Cervical back: Full passive range of motion without pain, normal range of motion and neck supple. No pain with movement, spinous process tenderness or muscular tenderness. Normal range of motion.     Right hip: No deformity. Normal range of motion.     Left hip: No deformity. Normal range of motion.     Right lower leg: No edema.     Left lower leg: No edema.       Legs:  Skin:    General: Skin is warm and dry.     Capillary Refill: Capillary refill takes less than 2 seconds.     Findings: No ecchymosis, erythema, lesion or wound.  Neurological:     Mental Status: He is alert. He is confused.     Cranial Nerves: Cranial nerves 2-12 are intact.     Sensory: Sensation is intact.     Motor: Motor function is intact. No weakness or abnormal muscle tone.     Coordination: Coordination is intact.  Psychiatric:        Mood and Affect: Mood normal.        Speech: Speech normal.        Behavior: Behavior normal.     ED Results / Procedures / Treatments   Labs (all labs ordered are listed, but only abnormal results are displayed) Labs Reviewed  CBC WITH DIFFERENTIAL/PLATELET - Abnormal; Notable for the following components:      Result Value   RBC 3.79 (*)    Hemoglobin 11.8 (*)    HCT 36.9 (*)    All other components within normal limits  BASIC METABOLIC PANEL - Abnormal; Notable for the following components:   Glucose, Bld 106 (*)    BUN 24 (*)    All other components within normal limits  URINALYSIS, W/ REFLEX TO CULTURE (INFECTION SUSPECTED)    EKG None  Radiology DG Chest 1 View Result Date: 02/14/2023 CLINICAL DATA:  82 year old male with history of trauma from a fall out of bed. EXAM: CHEST  1 VIEW COMPARISON:  Chest x-ray 12/14/2022. FINDINGS: Lung volumes are normal. No consolidative airspace disease. No pleural effusions. No pneumothorax. No pulmonary nodule or mass noted. Pulmonary vasculature and the  cardiomediastinal silhouette are within normal limits. IMPRESSION: No radiographic evidence of acute cardiopulmonary disease. Electronically Signed   By: Trudie Reed M.D.   On: 02/14/2023 05:13   DG Hip Unilat W or Wo Pelvis 2-3 Views Right Result Date: 02/14/2023 CLINICAL DATA:  82 year old  male with history of trauma from a fall. Injury to the right hip and sacral area. EXAM: DG HIP (WITH OR WITHOUT PELVIS) 2-3V RIGHT COMPARISON:  No priors. FINDINGS: Three views of the bony pelvis and right hip demonstrate no definite acute displaced fracture of the bony pelvic ring. Bilateral proximal femurs as visualized appear intact, and the right femoral head is located. There is extensive joint space narrowing, subchondral sclerosis, subchondral cyst formation and osteophyte formation in the hip joints bilaterally, indicative of advanced osteoarthritis. IMPRESSION: 1. No acute radiographic abnormality of the bony pelvis or the right hip. 2. Bilateral hip joint osteoarthritis. Electronically Signed   By: Trudie Reed M.D.   On: 02/14/2023 05:12   DG Lumbar Spine 2-3 Views Result Date: 02/14/2023 CLINICAL DATA:  82 year old male history of trauma from a fall with injury to the right hip and sacral area. EXAM: LUMBAR SPINE - 2-3 VIEW COMPARISON:  No priors. FINDINGS: Two views of the lumbar spine demonstrate no definite acute displaced fracture or compression type fracture. Alignment is anatomic. Multilevel degenerative disc disease, most severe at L4-L5. Multilevel facet arthropathy, most severe at L5-S1. Numerous vascular calcifications. IMPRESSION: 1. No acute radiographic abnormality of the lumbar spine. 2. Multilevel degenerative disc disease and lumbar spondylosis, as above. Electronically Signed   By: Trudie Reed M.D.   On: 02/14/2023 05:11   CT HEAD WO CONTRAST ( ) Result Date: 02/14/2023 CLINICAL DATA:  Neck trauma.  Head trauma EXAM: CT HEAD WITHOUT CONTRAST CT CERVICAL SPINE WITHOUT  CONTRAST TECHNIQUE: Multidetector CT imaging of the head and cervical spine was performed following the standard protocol without intravenous contrast. Multiplanar CT image reconstructions of the cervical spine were also generated. RADIATION DOSE REDUCTION: This exam was performed according to the departmental dose-optimization program which includes automated exposure control, adjustment of the mA and/or kV according to patient size and/or use of iterative reconstruction technique. COMPARISON:  12/14/2022 FINDINGS: CT HEAD FINDINGS Brain: No evidence of acute infarction, hemorrhage, hydrocephalus, extra-axial collection or mass lesion/mass effect. Generalized cerebral volume loss Vascular: No hyperdense vessel or unexpected calcification. Skull: Normal. Negative for fracture or focal lesion. Sinuses/Orbits: No evidence of injury CT CERVICAL SPINE FINDINGS Alignment: Normal. Skull base and vertebrae: No acute fracture. No primary bone lesion or focal pathologic process. Soft tissues and spinal canal: No prevertebral fluid or swelling. No visible canal hematoma. Disc levels:  Ordinary degenerative endplate spurring. Upper chest: No evidence of injury IMPRESSION: No evidence of intracranial or cervical spine injury. Electronically Signed   By: Tiburcio Pea M.D.   On: 02/14/2023 04:57   CT CERVICAL SPINE WO CONTRAST Result Date: 02/14/2023 CLINICAL DATA:  Neck trauma.  Head trauma EXAM: CT HEAD WITHOUT CONTRAST CT CERVICAL SPINE WITHOUT CONTRAST TECHNIQUE: Multidetector CT imaging of the head and cervical spine was performed following the standard protocol without intravenous contrast. Multiplanar CT image reconstructions of the cervical spine were also generated. RADIATION DOSE REDUCTION: This exam was performed according to the departmental dose-optimization program which includes automated exposure control, adjustment of the mA and/or kV according to patient size and/or use of iterative reconstruction  technique. COMPARISON:  12/14/2022 FINDINGS: CT HEAD FINDINGS Brain: No evidence of acute infarction, hemorrhage, hydrocephalus, extra-axial collection or mass lesion/mass effect. Generalized cerebral volume loss Vascular: No hyperdense vessel or unexpected calcification. Skull: Normal. Negative for fracture or focal lesion. Sinuses/Orbits: No evidence of injury CT CERVICAL SPINE FINDINGS Alignment: Normal. Skull base and vertebrae: No acute fracture. No primary bone lesion or focal pathologic process.  Soft tissues and spinal canal: No prevertebral fluid or swelling. No visible canal hematoma. Disc levels:  Ordinary degenerative endplate spurring. Upper chest: No evidence of injury IMPRESSION: No evidence of intracranial or cervical spine injury. Electronically Signed   By: Tiburcio Pea M.D.   On: 02/14/2023 04:57    Procedures Procedures    Medications Ordered in ED Medications  morphine (PF) 4 MG/ML injection 4 mg (4 mg Intravenous Given 02/14/23 0456)  ondansetron (ZOFRAN) injection 4 mg (4 mg Intravenous Given 02/14/23 0454)  HYDROmorphone (DILAUDID) injection 1 mg (1 mg Intravenous Given 02/14/23 0528)    ED Course/ Medical Decision Making/ A&P                                 Medical Decision Making Amount and/or Complexity of Data Reviewed Labs: ordered. Radiology: ordered.  Risk Prescription drug management.   Presents with hip pain after a fall.  Patient does think he hit his head but there is no hematoma or signs of trauma.  He is demented, at his normal baseline confused state.  No evidence of upper extremity injury.  Patient underwent CT head and cervical spine, no acute pathology noted.  Patient had x-ray of chest, lumbar spine, right hip performed.  No acute pathology noted.  He continues to complain of pain.  Seems to be indicating the posterior hip and gluteal area as the area of pain.  This area does have tenderness on palpation and some evidence of muscle spasm.   Treated with analgesia.  Will require CT to rule out hip fracture although it is felt to be less likely based on exam.  He has moving and flexing his hip without experiencing significant pain.  No obvious pelvic fractures on x-ray, CT will help with this as well.  Will need to sign with oncoming ER physician to review CT.        Final Clinical Impression(s) / ED Diagnoses Final diagnoses:  Contusion of right hip, initial encounter    Rx / DC Orders ED Discharge Orders     None         Alyzah Pelly, Canary Brim, MD 02/14/23 [phone]

## 2023-02-17 DIAGNOSIS — F0394 Unspecified dementia, unspecified severity, with anxiety: Secondary | ICD-10-CM | POA: Diagnosis not present

## 2023-02-17 DIAGNOSIS — F0392 Unspecified dementia, unspecified severity, with psychotic disturbance: Secondary | ICD-10-CM | POA: Diagnosis not present

## 2023-02-17 DIAGNOSIS — F03918 Unspecified dementia, unspecified severity, with other behavioral disturbance: Secondary | ICD-10-CM | POA: Diagnosis not present

## 2023-02-17 DIAGNOSIS — F062 Psychotic disorder with delusions due to known physiological condition: Secondary | ICD-10-CM | POA: Diagnosis not present

## 2023-02-17 DIAGNOSIS — F03911 Unspecified dementia, unspecified severity, with agitation: Secondary | ICD-10-CM | POA: Diagnosis not present

## 2023-02-17 DIAGNOSIS — F419 Anxiety disorder, unspecified: Secondary | ICD-10-CM | POA: Diagnosis not present

## 2023-02-21 DIAGNOSIS — Z9189 Other specified personal risk factors, not elsewhere classified: Secondary | ICD-10-CM | POA: Diagnosis not present

## 2023-02-21 DIAGNOSIS — N390 Urinary tract infection, site not specified: Secondary | ICD-10-CM | POA: Diagnosis not present

## 2023-02-21 DIAGNOSIS — F0390 Unspecified dementia without behavioral disturbance: Secondary | ICD-10-CM | POA: Diagnosis not present

## 2023-02-21 DIAGNOSIS — G894 Chronic pain syndrome: Secondary | ICD-10-CM | POA: Diagnosis not present

## 2023-02-21 DIAGNOSIS — U071 COVID-19: Secondary | ICD-10-CM | POA: Diagnosis not present

## 2023-02-22 DIAGNOSIS — R0602 Shortness of breath: Secondary | ICD-10-CM | POA: Diagnosis not present

## 2023-02-28 DIAGNOSIS — J189 Pneumonia, unspecified organism: Secondary | ICD-10-CM | POA: Diagnosis not present

## 2023-02-28 DIAGNOSIS — E782 Mixed hyperlipidemia: Secondary | ICD-10-CM | POA: Diagnosis not present

## 2023-02-28 DIAGNOSIS — I119 Hypertensive heart disease without heart failure: Secondary | ICD-10-CM | POA: Diagnosis not present

## 2023-02-28 DIAGNOSIS — U071 COVID-19: Secondary | ICD-10-CM | POA: Diagnosis not present

## 2023-04-06 DIAGNOSIS — N390 Urinary tract infection, site not specified: Secondary | ICD-10-CM | POA: Diagnosis not present

## 2023-04-06 DIAGNOSIS — M545 Low back pain, unspecified: Secondary | ICD-10-CM | POA: Diagnosis not present

## 2023-05-24 DIAGNOSIS — N390 Urinary tract infection, site not specified: Secondary | ICD-10-CM | POA: Diagnosis not present

## 2023-05-24 DIAGNOSIS — R32 Unspecified urinary incontinence: Secondary | ICD-10-CM | POA: Diagnosis not present

## 2023-07-07 ENCOUNTER — Emergency Department (HOSPITAL_COMMUNITY)
Admission: EM | Admit: 2023-07-07 | Discharge: 2023-07-07 | Disposition: A | Discharge status: Home / Self Care | Attending: Emergency Medicine | Admitting: Emergency Medicine

## 2023-07-07 ENCOUNTER — Other Ambulatory Visit: Payer: Self-pay

## 2023-07-07 ENCOUNTER — Emergency Department (HOSPITAL_COMMUNITY)

## 2023-07-07 DIAGNOSIS — S0003XA Contusion of scalp, initial encounter: Secondary | ICD-10-CM | POA: Diagnosis not present

## 2023-07-07 DIAGNOSIS — R220 Localized swelling, mass and lump, head: Secondary | ICD-10-CM | POA: Insufficient documentation

## 2023-07-07 DIAGNOSIS — S0990XA Unspecified injury of head, initial encounter: Secondary | ICD-10-CM | POA: Diagnosis not present

## 2023-07-07 DIAGNOSIS — I6782 Cerebral ischemia: Secondary | ICD-10-CM | POA: Diagnosis not present

## 2023-07-07 DIAGNOSIS — I2510 Atherosclerotic heart disease of native coronary artery without angina pectoris: Secondary | ICD-10-CM | POA: Insufficient documentation

## 2023-07-07 DIAGNOSIS — R69 Illness, unspecified: Secondary | ICD-10-CM | POA: Diagnosis not present

## 2023-07-07 DIAGNOSIS — W19XXXA Unspecified fall, initial encounter: Secondary | ICD-10-CM

## 2023-07-07 DIAGNOSIS — S199XXA Unspecified injury of neck, initial encounter: Secondary | ICD-10-CM | POA: Diagnosis not present

## 2023-07-07 NOTE — ED Notes (Signed)
 Pt waiting on transport from [place] at this time.

## 2023-07-07 NOTE — Progress Notes (Signed)
 [facility] Done ED [facility] liaison note     This patient is a current hospice patient with [facility]. Please see media tab for detailed hospice report.    Liaison will continue to follow for any discharge planning needs and to coordinate continuation of hospice care.    Please don't hesitate to call with any Hospice related questions or concerns.    Thank you for the opportunity to participate in this patient's care.  Madelene Schanz, BSN, RN, OCN [employer] [phone]

## 2023-07-07 NOTE — ED Provider Notes (Signed)
 [place] EMERGENCY DEPARTMENT AT [facility] Provider Note   CSN: [id number] Arrival date & time: 07/07/23  4098     History  Chief Complaint  Patient presents with   Carlos [name] is a 83 y.o. male.  He has history of dementia.  He is here for evaluation of a fall from his wheelchair today.  Patient is wheelchair-bound at baseline but tried to get out of his wheelchair today and fell the ground hitting the right side of his head per his sitter who is at bedside.  No LOC or bleeding or other complaints.  Patient denies pain.  He is oriented to self only, remainder of history comes from his caregiver.   Fall       Home Medications Prior to Admission medications   Medication Sig Start Date End Date Taking? Authorizing Provider  b complex vitamins tablet Take 1 tablet by mouth every Monday.    [provider]  calcium  carbonate (TUMS EX) 750 MG chewable tablet Chew 1 tablet by mouth every 8 (eight) hours as needed for heartburn.    [provider]  clopidogrel  (PLAVIX ) 75 MG tablet Take 1 tablet (75 mg total) by mouth daily. 09/30/21   Hassan Links, MD  docusate sodium  (COLACE) 100 MG capsule Take 1 capsule (100 mg total) by mouth 2 (two) times daily. Patient not taking: Reported on 12/08/2022 08/06/22   Dahal, Binaya, MD  Glucosamine HCl (GLUCOSAMINE PO) Take 2 tablets by mouth 2 (two) times daily.    [provider]  HYDROcodone -acetaminophen  (NORCO/VICODIN) 5-325 MG tablet Take 1 tablet by mouth every 6 (six) hours as needed. 02/14/23   Dorenda Gandy, MD  loperamide (IMODIUM A-D) 2 MG tablet Take 2 mg by mouth every 6 (six) hours as needed for diarrhea or loose stools.    [provider]  metoprolol  tartrate (LOPRESSOR ) 25 MG tablet Take 0.5 tablets (12.5 mg total) by mouth 2 (two) times daily. 12/13/22   Danford, Willis Harter, MD  Multiple Vitamin (MULTIVITAMIN) tablet Take 1 tablet by mouth daily with breakfast.     [provider]  nitroGLYCERIN  (NITROSTAT ) 0.4 MG SL tablet Place 1 tablet (0.4 mg total) under the tongue every 5 (five) minutes x 3 doses as needed for chest pain. Patient taking differently: Place 0.4 mg under the tongue every 5 (five) minutes x 3 doses as needed for chest pain (CALL MD, IF NO RELIEF). 09/30/21   Hassan Links, MD  Omega-3 Fatty Acids (FISH OIL OMEGA-3) 1000 MG CAPS Take 1,000 mg by mouth daily.    [provider]  polyethylene glycol (MIRALAX  / GLYCOLAX ) 17 g packet Take 17 g by mouth daily as needed for mild constipation. Patient taking differently: Take 17 g by mouth daily as needed for mild constipation (to be mixed into 4 ounces of water). 08/06/22   Hoyt Macleod, MD  QUEtiapine  (SEROQUEL ) 25 MG tablet Take 1 tablet (25 mg total) by mouth 2 (two) times daily. Patient taking differently: Take 25 mg by mouth at bedtime. 08/06/22   Hoyt Macleod, MD  simvastatin  (ZOCOR ) 40 MG tablet Take 40 mg by mouth every evening.    [provider]  vitamin E 180 MG (400 UNITS) capsule Take 400 Units by mouth daily.    [provider]      Allergies    Patient has no known allergies.    Review of Systems   Review of Systems  Physical Exam  Updated Vital Signs BP 123/63 (BP Location: Left Arm)   Pulse 68   Temp 98 F (36.7 C) (Oral)   Resp 20   SpO2 98%  Physical Exam Vitals and nursing note reviewed.  Constitutional:      General: He is not in acute distress.    Appearance: He is well-developed.  HENT:     Head: Normocephalic.     Comments: Mild right occipital swelling, no laceration, no crepitus    Mouth/Throat:     Mouth: Mucous membranes are moist.  Eyes:     Extraocular Movements: Extraocular movements intact.     Conjunctiva/sclera: Conjunctivae normal.     Pupils: Pupils are equal, round, and reactive to light.  Cardiovascular:     Rate and Rhythm: Normal rate and regular rhythm.     Heart sounds: No murmur heard. Pulmonary:      Effort: Pulmonary effort is normal. No respiratory distress.     Breath sounds: Normal breath sounds.  Abdominal:     Palpations: Abdomen is soft.     Tenderness: There is no abdominal tenderness.  Musculoskeletal:        General: No swelling.     Cervical back: Neck supple.  Skin:    General: Skin is warm and dry.     Capillary Refill: Capillary refill takes less than 2 seconds.  Neurological:     General: No focal deficit present.     Mental Status: He is alert and oriented to person, place, and time.     Motor: No weakness.  Psychiatric:        Mood and Affect: Mood normal.     ED Results / Procedures / Treatments   Labs (all labs ordered are listed, but only abnormal results are displayed) Labs Reviewed - No data to display  EKG None  Radiology No results found.  Procedures Procedures    Medications Ordered in ED Medications - No data to display  ED Course/ Medical Decision Making/ A&P                                 Medical Decision Making This patient presents to the ED for concern of head injury due to mechanical fall this involves an extensive number of treatment options, and is a complaint that carries with it a high risk of complications and morbidity.  The differential diagnosis includes contusion, concussion, skull fracture, intracranial hemorrhage, C-spine injury, other   Co morbidities that complicate the patient evaluation :   Dementia, CAD   Additional history obtained:  Additional history obtained from EMR External records from outside source obtained and reviewed including prior notes    Imaging Studies ordered:  I ordered imaging studies including CT head and C-spine which shows no intracranial hemorrhage or acute C-spine fracture or traumatic malalignment I independently visualized and interpreted imaging within scope of identifying emergent findings  I agree with the radiologist interpretation     Problem List / ED Course /  Critical interventions / Medication management  Patient is wheelchair-bound, can transfer out of the wheelchair with assistance.  Apparently got a new wheelchair and sitter had briefly stepped away and patient got up and essentially fell to the ground striking the right posterior aspect of his head, he has a small mild soft tissue swelling here but no deformities, he is at his mental baseline.  CT head and C-spine are normal.  He is not  on blood thinners.  Discussed with patient's niece his findings today, she states that his new wheelchair states a bit higher and they are going to try to adjust so it is not as easy for him to get out of the wheelchair. I have reviewed the patients home medicines and have made adjustments as needed   Social Determinants of Health:  Pt is in memory care       Amount and/or Complexity of Data Reviewed Radiology: ordered.           Final Clinical Impression(s) / ED Diagnoses Final diagnoses:  None    Rx / DC Orders ED Discharge Orders     None         Aimee Houseman, PA-C 07/07/23 1418    Jerilynn Montenegro, MD 07/08/23 1621

## 2023-07-07 NOTE — ED Triage Notes (Signed)
 Pt arrived via [facility] from [facility] c/o unwitnessed fall from wheelchair in the dinning room, pt has a sitter with him at [place] [employer] due to Hx of dementia. Unaware if pt hit his head, denies LOC, denies use of blood thinners, denies any pain and does not appear to be in pain at this time.

## 2023-07-07 NOTE — Discharge Instructions (Signed)
 You are seen today for head injury after a fall.  Your CT scan of your head and cervical spine were normal.  Follow up with your doctor.  Come back for any new or worsening symptoms.

## 2023-07-22 DIAGNOSIS — Z79899 Other long term (current) drug therapy: Secondary | ICD-10-CM | POA: Diagnosis not present

## 2023-07-22 DIAGNOSIS — I10 Essential (primary) hypertension: Secondary | ICD-10-CM | POA: Diagnosis not present

## 2023-08-04 DIAGNOSIS — N390 Urinary tract infection, site not specified: Secondary | ICD-10-CM | POA: Diagnosis not present

## 2023-09-07 DIAGNOSIS — N390 Urinary tract infection, site not specified: Secondary | ICD-10-CM | POA: Diagnosis not present

## 2023-09-07 DIAGNOSIS — K080 Exfoliation of teeth due to systemic causes: Secondary | ICD-10-CM | POA: Diagnosis not present

## 2023-09-22 DIAGNOSIS — K080 Exfoliation of teeth due to systemic causes: Secondary | ICD-10-CM | POA: Diagnosis not present

## 2023-09-26 DIAGNOSIS — M25511 Pain in right shoulder: Secondary | ICD-10-CM | POA: Diagnosis not present

## 2023-09-26 DIAGNOSIS — M25411 Effusion, right shoulder: Secondary | ICD-10-CM | POA: Diagnosis not present

## 2023-10-03 DIAGNOSIS — G894 Chronic pain syndrome: Secondary | ICD-10-CM | POA: Diagnosis not present

## 2023-10-03 DIAGNOSIS — M25511 Pain in right shoulder: Secondary | ICD-10-CM | POA: Diagnosis not present

## 2023-11-09 DIAGNOSIS — N390 Urinary tract infection, site not specified: Secondary | ICD-10-CM | POA: Diagnosis not present

## 2023-12-13 DIAGNOSIS — N390 Urinary tract infection, site not specified: Secondary | ICD-10-CM | POA: Diagnosis not present

## 2024-01-27 DIAGNOSIS — N390 Urinary tract infection, site not specified: Secondary | ICD-10-CM | POA: Diagnosis not present
